# Patient Record
Sex: Male | Born: 1950
Health system: Southern US, Community
[De-identification: ages and names within clinical notes are randomized; demographics above are authoritative.]

## PROBLEM LIST (undated history)

## (undated) DIAGNOSIS — R748 Abnormal levels of other serum enzymes: Secondary | ICD-10-CM

## (undated) DIAGNOSIS — M1710 Unilateral primary osteoarthritis, unspecified knee: Secondary | ICD-10-CM

## (undated) DIAGNOSIS — E785 Hyperlipidemia, unspecified: Secondary | ICD-10-CM

## (undated) DIAGNOSIS — G250 Essential tremor: Secondary | ICD-10-CM

## (undated) DIAGNOSIS — K5140 Inflammatory polyps of colon without complications: Secondary | ICD-10-CM

## (undated) DIAGNOSIS — K5080 Crohn's disease of both small and large intestine without complications: Secondary | ICD-10-CM

## (undated) DIAGNOSIS — H9191 Unspecified hearing loss, right ear: Secondary | ICD-10-CM

## (undated) DIAGNOSIS — Z974 Presence of external hearing-aid: Secondary | ICD-10-CM

## (undated) DIAGNOSIS — H53459 Other localized visual field defect, unspecified eye: Secondary | ICD-10-CM

## (undated) DIAGNOSIS — F1090 Alcohol use, unspecified, uncomplicated: Secondary | ICD-10-CM

## (undated) DIAGNOSIS — H3320 Serous retinal detachment, unspecified eye: Secondary | ICD-10-CM

## (undated) DIAGNOSIS — K5090 Crohn's disease, unspecified, without complications: Secondary | ICD-10-CM

## (undated) DIAGNOSIS — K4090 Unilateral inguinal hernia, without obstruction or gangrene, not specified as recurrent: Secondary | ICD-10-CM

## (undated) HISTORY — PX: HERNIA REPAIR: SHX51

## (undated) HISTORY — DX: Other localized visual field defect, unspecified eye: H53.459

## (undated) HISTORY — DX: Abnormal levels of other serum enzymes: R74.8

## (undated) HISTORY — DX: Alcohol use, unspecified, uncomplicated: F10.90

## (undated) HISTORY — DX: Inflammatory polyps of colon without complications: K51.40

## (undated) HISTORY — DX: Crohn's disease of both small and large intestine without complications: K50.80

## (undated) HISTORY — DX: Serous retinal detachment, unspecified eye: H33.20

## (undated) HISTORY — DX: Hyperlipidemia, unspecified: E78.5

## (undated) HISTORY — PX: ACOUSTIC NEUROMA RESECTION: SHX5713

## (undated) HISTORY — PX: TONSILLECTOMY: SUR1361

## (undated) HISTORY — PX: CATARACT EXTRACTION W/ INTRAOCULAR LENS  IMPLANT, BILATERAL: SHX1307

## (undated) HISTORY — DX: Unilateral primary osteoarthritis, unspecified knee: M17.10

## (undated) HISTORY — PX: RETINAL DETACHMENT SURGERY: SHX105

---

## 1998-06-29 ENCOUNTER — Inpatient Hospital Stay (HOSPITAL_COMMUNITY): Admission: EM | Admit: 1998-06-29 | Discharge: 1998-07-02 | Payer: Self-pay | Admitting: Emergency Medicine

## 2000-08-17 ENCOUNTER — Ambulatory Visit (HOSPITAL_COMMUNITY): Admission: RE | Admit: 2000-08-17 | Discharge: 2000-08-17 | Payer: Self-pay | Admitting: Gastroenterology

## 2003-01-25 ENCOUNTER — Emergency Department (HOSPITAL_COMMUNITY): Admission: EM | Admit: 2003-01-25 | Discharge: 2003-01-25 | Payer: Self-pay | Admitting: Emergency Medicine

## 2003-11-14 ENCOUNTER — Ambulatory Visit (HOSPITAL_COMMUNITY): Admission: RE | Admit: 2003-11-14 | Discharge: 2003-11-15 | Payer: Self-pay | Admitting: Ophthalmology

## 2004-10-07 ENCOUNTER — Ambulatory Visit (HOSPITAL_COMMUNITY): Admission: RE | Admit: 2004-10-07 | Discharge: 2004-10-07 | Payer: Self-pay | Admitting: Gastroenterology

## 2011-08-06 ENCOUNTER — Other Ambulatory Visit: Payer: Self-pay | Admitting: Gastroenterology

## 2011-11-04 DIAGNOSIS — H53459 Other localized visual field defect, unspecified eye: Secondary | ICD-10-CM

## 2011-11-04 HISTORY — DX: Other localized visual field defect, unspecified eye: H53.459

## 2012-09-06 ENCOUNTER — Other Ambulatory Visit: Payer: Self-pay | Admitting: Gastroenterology

## 2012-10-05 DIAGNOSIS — E785 Hyperlipidemia, unspecified: Secondary | ICD-10-CM | POA: Insufficient documentation

## 2012-10-05 DIAGNOSIS — K5010 Crohn's disease of large intestine without complications: Secondary | ICD-10-CM | POA: Insufficient documentation

## 2012-10-05 HISTORY — DX: Hyperlipidemia, unspecified: E78.5

## 2012-10-11 HISTORY — PX: BROW LIFT AND BLEPHAROPLASTY: SHX1271

## 2013-11-21 ENCOUNTER — Other Ambulatory Visit: Payer: Self-pay | Admitting: Gastroenterology

## 2013-11-21 ENCOUNTER — Other Ambulatory Visit (HOSPITAL_COMMUNITY)
Admission: RE | Admit: 2013-11-21 | Discharge: 2013-11-21 | Disposition: A | Discharge status: Home / Self Care | Payer: Self-pay | Source: Ambulatory Visit | Attending: Gastroenterology | Admitting: Gastroenterology

## 2013-11-21 DIAGNOSIS — K5190 Ulcerative colitis, unspecified, without complications: Secondary | ICD-10-CM | POA: Insufficient documentation

## 2014-02-28 DIAGNOSIS — K5080 Crohn's disease of both small and large intestine without complications: Secondary | ICD-10-CM

## 2014-02-28 HISTORY — DX: Crohn's disease of both small and large intestine without complications: K50.80

## 2015-01-23 ENCOUNTER — Other Ambulatory Visit: Payer: Self-pay | Admitting: Gastroenterology

## 2015-12-10 MED FILL — ATORVASTATIN 20 MG TABLET: 20 | 31 days supply | Qty: 31 | Fill #5

## 2015-12-10 MED FILL — LIALDA 1.2 GM TABLET SA: 1.2 | 30 days supply | Qty: 60 | Fill #4

## 2016-01-14 MED FILL — LIALDA 1.2 GM TABLET SA: 1.2 | 30 days supply | Qty: 60 | Fill #5

## 2016-01-14 MED FILL — ATORVASTATIN 20 MG TABLET: 20 | 31 days supply | Qty: 31 | Fill #6

## 2016-02-06 MED FILL — LIALDA 1.2 GM TABLET SA: 1.2 | 30 days supply | Qty: 60 | Fill #6

## 2016-02-07 MED FILL — ATORVASTATIN 20 MG TABLET: 20 | 31 days supply | Qty: 31 | Fill #7

## 2016-03-19 MED FILL — LIALDA 1.2 GM TABLET SA: 1.2 | 30 days supply | Qty: 60 | Fill #7

## 2016-04-07 MED FILL — GAVILYTE-N SOLUTION: 420 | 1 days supply | Qty: 4000 | Fill #0

## 2016-04-20 DIAGNOSIS — M1710 Unilateral primary osteoarthritis, unspecified knee: Secondary | ICD-10-CM | POA: Insufficient documentation

## 2016-04-20 DIAGNOSIS — L570 Actinic keratosis: Secondary | ICD-10-CM | POA: Insufficient documentation

## 2016-04-20 HISTORY — DX: Unilateral primary osteoarthritis, unspecified knee: M17.10

## 2016-04-22 ENCOUNTER — Other Ambulatory Visit: Payer: Self-pay | Admitting: Gastroenterology

## 2016-04-22 LAB — HM COLONOSCOPY

## 2016-05-09 MED FILL — PROPRANOLOL 20 MG TABLET: 20 | 30 days supply | Qty: 60 | Fill #0

## 2016-05-26 MED FILL — LIALDA 1.2 GM TABLET SA: 1.2 | 30 days supply | Qty: 60 | Fill #0

## 2016-06-05 DIAGNOSIS — G250 Essential tremor: Secondary | ICD-10-CM | POA: Insufficient documentation

## 2016-06-05 DIAGNOSIS — R1909 Other intra-abdominal and pelvic swelling, mass and lump: Secondary | ICD-10-CM | POA: Insufficient documentation

## 2016-06-23 ENCOUNTER — Other Ambulatory Visit: Payer: Self-pay | Admitting: Surgery

## 2016-07-01 LAB — LIPID PANEL
HDL: 34 — AB (ref 35–70)
LDL Cholesterol: 110
Triglycerides: 88 (ref 40–160)

## 2016-07-01 LAB — BASIC METABOLIC PANEL
Potassium: 4.3 (ref 3.4–5.3)
Sodium: 143 (ref 137–147)

## 2016-07-01 LAB — TSH: TSH: 5.01 (ref 0.41–5.90)

## 2016-07-08 DIAGNOSIS — K4090 Unilateral inguinal hernia, without obstruction or gangrene, not specified as recurrent: Secondary | ICD-10-CM

## 2016-07-08 HISTORY — DX: Unilateral inguinal hernia, without obstruction or gangrene, not specified as recurrent: K40.90

## 2016-07-09 MED FILL — PROPRANOLOL 20 MG TABLET: 20 | 30 days supply | Qty: 60 | Fill #0

## 2016-07-09 MED FILL — LIALDA 1.2 GM TABLET SA: 1.2 | 30 days supply | Qty: 60 | Fill #1

## 2016-07-10 ENCOUNTER — Encounter (HOSPITAL_BASED_OUTPATIENT_CLINIC_OR_DEPARTMENT_OTHER): Payer: Self-pay | Admitting: *Deleted

## 2016-07-15 NOTE — H&P (Signed)
Shaun Armstrong 06/23/2016 3:41 PM Location: [facility] Patient #: [id number] DOB: [birth year] Married / Language: English / Race: White Male   History of Present Illness (Elenie Coven A. Ninfa Linden MD; 06/23/2016 4:09 PM) Patient words: [place].  The patient is a 65 year old male who presents with an inguinal hernia. This is a pleasant gentleman referred by Dr. Mikki Santee Buccini for evaluation of a right inguinal hernia. The patient noticed a reducible bulge in his right groin since May. He has had minimal discomfort with this. He reports that it always easily reduces. He has had no objective symptoms and no difficulty voiding. He is otherwise without complaints.   Other Problems Malachi Bonds, CMA; 06/23/2016 3:41 PM) Crohn's Disease Hypercholesterolemia Other disease, cancer, significant illness  Past Surgical History Malachi Bonds, CMA; 06/23/2016 3:41 PM) Cataract Surgery Bilateral. Colon Polyp Removal - Colonoscopy Oral Surgery Tonsillectomy  Diagnostic Studies History Malachi Bonds, CMA; 06/23/2016 3:41 PM) Colonoscopy within last year  Allergies Malachi Bonds, CMA; 06/23/2016 3:44 PM) No Known Drug Allergies07/17/2017  Medication History Malachi Bonds, CMA; 06/23/2016 3:47 PM) Atorvastatin Calcium (20MG Tablet, Oral) Active. Propranolol HCl (20MG Tablet, Oral) Active. Lialda (1.2GM Tablet DR, Oral) Active. Medications Reconciled  Social History Malachi Bonds, CMA; 06/23/2016 3:41 PM) Alcohol use Occasional alcohol use. Caffeine use Coffee. Illicit drug use Remotely quit drug use. Tobacco use Former smoker.  Family History Malachi Bonds, CMA; 06/23/2016 3:41 PM) Hypertension Mother.    Review of Systems Malachi Bonds CMA; 06/23/2016 3:41 PM) General Not Present- Appetite Loss, Chills, Fatigue, Fever, Night Sweats, Weight Gain and Weight Loss. Skin Not Present- Change in Wart/Mole, Dryness, Hives, Jaundice, New Lesions, Non-Healing Wounds,  Rash and Ulcer. HEENT Present- Hearing Loss and Ringing in the Ears. Not Present- Earache, Hoarseness, Nose Bleed, Oral Ulcers, Seasonal Allergies, Sinus Pain, Sore Throat, Visual Disturbances, Wears glasses/contact lenses and Yellow Eyes. Respiratory Present- Snoring. Not Present- Bloody sputum, Chronic Cough, Difficulty Breathing and Wheezing. Breast Not Present- Breast Mass, Breast Pain, Nipple Discharge and Skin Changes. Cardiovascular Not Present- Chest Pain, Difficulty Breathing Lying Down, Leg Cramps, Palpitations, Rapid Heart Rate, Shortness of Breath and Swelling of Extremities. Gastrointestinal Present- Abdominal Pain. Not Present- Bloating, Bloody Stool, Change in Bowel Habits, Chronic diarrhea, Constipation, Difficulty Swallowing, Excessive gas, Gets full quickly at meals, Hemorrhoids, Indigestion, Nausea, Rectal Pain and Vomiting. Male Genitourinary Not Present- Blood in Urine, Change in Urinary Stream, Frequency, Impotence, Nocturia, Painful Urination, Urgency and Urine Leakage. Musculoskeletal Not Present- Back Pain, Joint Pain, Joint Stiffness, Muscle Pain, Muscle Weakness and Swelling of Extremities. Neurological Present- Tremor. Not Present- Decreased Memory, Fainting, Headaches, Numbness, Seizures, Tingling, Trouble walking and Weakness. Psychiatric Not Present- Anxiety, Bipolar, Change in Sleep Pattern, Depression, Fearful and Frequent crying. Endocrine Not Present- Cold Intolerance, Excessive Hunger, Hair Changes, Heat Intolerance, Hot flashes and New Diabetes. Hematology Not Present- Blood Thinners, Easy Bruising, Excessive bleeding, Gland problems, HIV and Persistent Infections.  Vitals (Chemira Jones CMA; 06/23/2016 3:43 PM) 06/23/2016 3:42 PM Weight: 195.6 lb Height: 69in Body Surface Area: 2.05 m Body Mass Index: 28.88 kg/m  Temp.: 98.71F  Pulse: 55 (Regular)  P.OX: 94% (Room air) BP: 122/66 (Sitting, Left Arm, Standard)       Physical Exam (Breelle Hollywood A.  Ninfa Linden MD; 06/23/2016 4:09 PM) General Mental Status-Alert. General Appearance-Consistent with stated age. Hydration-Well hydrated. Voice-Normal.  Head and Neck Head-normocephalic, atraumatic with no lesions or palpable masses. Trachea-midline.  Eye Eyeball - Bilateral-Extraocular movements intact. Sclera/Conjunctiva - Bilateral-No scleral icterus.  Chest and Lung Exam  Chest and lung exam reveals -quiet, even and easy respiratory effort with no use of accessory muscles and on auscultation, normal breath sounds, no adventitious sounds and normal vocal resonance. Inspection Chest Wall - Normal. Back - normal.  Cardiovascular Cardiovascular examination reveals -normal heart sounds, regular rate and rhythm with no murmurs and normal pedal pulses bilaterally.  Abdomen Inspection Skin - Scar - no surgical scars. Hernias - Inguinal hernia - Right - Reducible. Palpation/Percussion Palpation and Percussion of the abdomen reveal - Soft, Non Tender, No Rebound tenderness, No Rigidity (guarding) and No hepatosplenomegaly. Auscultation Auscultation of the abdomen reveals - Bowel sounds normal.  Neurologic Neurologic evaluation reveals -alert and oriented x 3 with no impairment of recent or remote memory. Mental Status-Normal.  Musculoskeletal Normal Exam - Left-Upper Extremity Strength Normal and Lower Extremity Strength Normal. Normal Exam - Right-Upper Extremity Strength Normal, Lower Extremity Weakness.    Assessment & Plan (Samson Ralph A. Ninfa Linden MD; 06/23/2016 4:12 PM) RIGHT INGUINAL HERNIA (K40.90) Impression: I discussed the diagnosis with him in detail. I discussed surgical repair of a right inguinal hernia with mesh. I discussed both the laparoscopic and open techniques. He wished to proceed with a right laparoscopic inguinal hernia repair with mesh. I discussed the risk of surgery which includes but is not limited to bleeding, infection, injury to  surrounding structures, nerve entrapment, chronic pain, recurrent hernia, etc. I also discussed postoperative recovery. He understands and wishes to proceed with surgery which will be scheduled

## 2016-07-16 ENCOUNTER — Encounter (HOSPITAL_BASED_OUTPATIENT_CLINIC_OR_DEPARTMENT_OTHER)
Admission: RE | Disposition: A | Discharge status: Home / Self Care | Payer: Self-pay | Source: Ambulatory Visit | Attending: Surgery

## 2016-07-16 ENCOUNTER — Ambulatory Visit (HOSPITAL_BASED_OUTPATIENT_CLINIC_OR_DEPARTMENT_OTHER)
Admission: RE | Admit: 2016-07-16 | Discharge: 2016-07-16 | Disposition: A | Discharge status: Home / Self Care | Payer: 59 | Source: Ambulatory Visit | Attending: Surgery | Admitting: Surgery

## 2016-07-16 ENCOUNTER — Ambulatory Visit (HOSPITAL_BASED_OUTPATIENT_CLINIC_OR_DEPARTMENT_OTHER): Payer: 59 | Admitting: Anesthesiology

## 2016-07-16 ENCOUNTER — Encounter (HOSPITAL_BASED_OUTPATIENT_CLINIC_OR_DEPARTMENT_OTHER): Payer: Self-pay | Admitting: Anesthesiology

## 2016-07-16 DIAGNOSIS — Z87891 Personal history of nicotine dependence: Secondary | ICD-10-CM | POA: Diagnosis not present

## 2016-07-16 DIAGNOSIS — K4090 Unilateral inguinal hernia, without obstruction or gangrene, not specified as recurrent: Secondary | ICD-10-CM | POA: Insufficient documentation

## 2016-07-16 HISTORY — DX: Crohn's disease, unspecified, without complications: K50.90

## 2016-07-16 HISTORY — PX: INSERTION OF MESH: SHX5868

## 2016-07-16 HISTORY — PX: INGUINAL HERNIA REPAIR: SHX194

## 2016-07-16 HISTORY — DX: Unilateral inguinal hernia, without obstruction or gangrene, not specified as recurrent: K40.90

## 2016-07-16 HISTORY — DX: Essential tremor: G25.0

## 2016-07-16 HISTORY — DX: Presence of external hearing-aid: Z97.4

## 2016-07-16 HISTORY — DX: Unspecified hearing loss, right ear: H91.91

## 2016-07-16 SURGERY — REPAIR, HERNIA, INGUINAL, LAPAROSCOPIC
Anesthesia: General | Site: Groin | Laterality: Right

## 2016-07-16 MED ORDER — KETOROLAC TROMETHAMINE 30 MG/ML IJ SOLN
INTRAMUSCULAR | Status: AC
  Filled 2016-07-16: qty 1

## 2016-07-16 MED ORDER — FENTANYL CITRATE (PF) 100 MCG/2ML IJ SOLN: 50.0000 ug | INTRAMUSCULAR | Status: DC | PRN

## 2016-07-16 MED ORDER — ONDANSETRON HCL 4 MG/2ML IJ SOLN
INTRAMUSCULAR | Status: DC | PRN
  Administered 2016-07-16: 4 mg via INTRAVENOUS

## 2016-07-16 MED ORDER — SUCCINYLCHOLINE CHLORIDE 200 MG/10ML IV SOSY
PREFILLED_SYRINGE | INTRAVENOUS | Status: AC
  Filled 2016-07-16: qty 10

## 2016-07-16 MED ORDER — DEXAMETHASONE SODIUM PHOSPHATE 10 MG/ML IJ SOLN
INTRAMUSCULAR | Status: AC
  Filled 2016-07-16: qty 1

## 2016-07-16 MED ORDER — PROPOFOL 500 MG/50ML IV EMUL
INTRAVENOUS | Status: AC
  Filled 2016-07-16: qty 50

## 2016-07-16 MED ORDER — CIPROFLOXACIN IN D5W 400 MG/200ML IV SOLN
INTRAVENOUS | Status: DC | PRN
  Administered 2016-07-16: 400 mg via INTRAVENOUS

## 2016-07-16 MED ORDER — OXYCODONE HCL 5 MG PO TABS
5.0000 mg | ORAL_TABLET | Freq: Once | ORAL | Status: AC | PRN
  Administered 2016-07-16: 5 mg via ORAL

## 2016-07-16 MED ORDER — SUCCINYLCHOLINE CHLORIDE 20 MG/ML IJ SOLN
INTRAMUSCULAR | Status: DC | PRN
  Administered 2016-07-16: 120 mg via INTRAVENOUS

## 2016-07-16 MED ORDER — OXYCODONE HCL 5 MG PO TABS: 5.0000 mg | ORAL_TABLET | ORAL | Status: DC | PRN

## 2016-07-16 MED ORDER — HYDROMORPHONE HCL 1 MG/ML IJ SOLN
0.2500 mg | INTRAMUSCULAR | Status: DC | PRN
Start: 2016-07-16 — End: 2016-07-16

## 2016-07-16 MED ORDER — KETOROLAC TROMETHAMINE 30 MG/ML IJ SOLN
INTRAMUSCULAR | Status: DC | PRN
Start: 2016-07-16 — End: 2016-07-16
  Administered 2016-07-16: 30 mg via INTRAVENOUS

## 2016-07-16 MED ORDER — MIDAZOLAM HCL 2 MG/2ML IJ SOLN
INTRAMUSCULAR | Status: AC
  Filled 2016-07-16: qty 2

## 2016-07-16 MED ORDER — LIDOCAINE HCL (CARDIAC) 20 MG/ML IV SOLN
INTRAVENOUS | Status: DC | PRN
  Administered 2016-07-16: 100 mg via INTRAVENOUS

## 2016-07-16 MED ORDER — OXYCODONE HCL 5 MG PO TABS
ORAL_TABLET | ORAL | Status: AC
  Filled 2016-07-16: qty 1

## 2016-07-16 MED ORDER — MORPHINE SULFATE (PF) 2 MG/ML IV SOLN: 1.0000 mg | INTRAVENOUS | Status: DC | PRN

## 2016-07-16 MED ORDER — OXYCODONE HCL 5 MG/5ML PO SOLN: 5.0000 mg | Freq: Once | ORAL | Status: AC | PRN

## 2016-07-16 MED ORDER — SODIUM CHLORIDE 0.9% FLUSH: 3.0000 mL | INTRAVENOUS | Status: DC | PRN

## 2016-07-16 MED ORDER — ONDANSETRON HCL 4 MG/2ML IJ SOLN: 4.0000 mg | Freq: Once | INTRAMUSCULAR | Status: DC | PRN

## 2016-07-16 MED ORDER — SCOPOLAMINE 1 MG/3DAYS TD PT72: 1.0000 | MEDICATED_PATCH | Freq: Once | TRANSDERMAL | Status: DC | PRN

## 2016-07-16 MED ORDER — HYDROCODONE-ACETAMINOPHEN 5-325 MG PO TABS: 1.0000 | ORAL_TABLET | ORAL | 0 refills | Status: DC | PRN

## 2016-07-16 MED ORDER — CIPROFLOXACIN IN D5W 400 MG/200ML IV SOLN
INTRAVENOUS | Status: AC
  Filled 2016-07-16: qty 200

## 2016-07-16 MED ORDER — LACTATED RINGERS IV SOLN
INTRAVENOUS | Status: DC | PRN
  Administered 2016-07-16: 10:00:00 via INTRAVENOUS

## 2016-07-16 MED ORDER — ONDANSETRON HCL 4 MG/2ML IJ SOLN
INTRAMUSCULAR | Status: AC
  Filled 2016-07-16: qty 2

## 2016-07-16 MED ORDER — MEPERIDINE HCL 25 MG/ML IJ SOLN: 6.2500 mg | INTRAMUSCULAR | Status: DC | PRN

## 2016-07-16 MED ORDER — GLYCOPYRROLATE 0.2 MG/ML IJ SOLN: 0.2000 mg | Freq: Once | INTRAMUSCULAR | Status: DC | PRN

## 2016-07-16 MED ORDER — FENTANYL CITRATE (PF) 100 MCG/2ML IJ SOLN
INTRAMUSCULAR | Status: DC | PRN
  Administered 2016-07-16: 100 ug via INTRAVENOUS

## 2016-07-16 MED ORDER — BUPIVACAINE-EPINEPHRINE (PF) 0.5% -1:200000 IJ SOLN
INTRAMUSCULAR | Status: DC | PRN
  Administered 2016-07-16: 20 mL

## 2016-07-16 MED ORDER — PROPOFOL 10 MG/ML IV BOLUS
INTRAVENOUS | Status: DC | PRN
Start: 2016-07-16 — End: 2016-07-16
  Administered 2016-07-16: 200 mg via INTRAVENOUS

## 2016-07-16 MED ORDER — FENTANYL CITRATE (PF) 100 MCG/2ML IJ SOLN
INTRAMUSCULAR | Status: AC
Start: 2016-07-16 — End: 2016-07-16
  Filled 2016-07-16: qty 2

## 2016-07-16 MED ORDER — MIDAZOLAM HCL 5 MG/5ML IJ SOLN
INTRAMUSCULAR | Status: DC | PRN
  Administered 2016-07-16: 2 mg via INTRAVENOUS

## 2016-07-16 MED ORDER — MIDAZOLAM HCL 2 MG/2ML IJ SOLN: 1.0000 mg | INTRAMUSCULAR | Status: DC | PRN

## 2016-07-16 MED ORDER — DEXAMETHASONE SODIUM PHOSPHATE 4 MG/ML IJ SOLN
INTRAMUSCULAR | Status: DC | PRN
  Administered 2016-07-16: 10 mg via INTRAVENOUS

## 2016-07-16 MED ORDER — SODIUM CHLORIDE 0.9% FLUSH: 3.0000 mL | Freq: Two times a day (BID) | INTRAVENOUS | Status: DC

## 2016-07-16 MED ORDER — SODIUM CHLORIDE 0.9 % IV SOLN: 250.0000 mL | INTRAVENOUS | Status: DC | PRN

## 2016-07-16 MED ORDER — ACETAMINOPHEN 650 MG RE SUPP: 650.0000 mg | RECTAL | Status: DC | PRN

## 2016-07-16 MED ORDER — ACETAMINOPHEN 325 MG PO TABS: 650.0000 mg | ORAL_TABLET | ORAL | Status: DC | PRN

## 2016-07-16 MED ORDER — LIDOCAINE 2% (20 MG/ML) 5 ML SYRINGE
INTRAMUSCULAR | Status: AC
  Filled 2016-07-16: qty 5

## 2016-07-16 MED ORDER — LACTATED RINGERS IV SOLN: INTRAVENOUS | Status: DC

## 2016-07-16 MED FILL — ATORVASTATIN 20 MG TABLET: 20 | 30 days supply | Qty: 30 | Fill #0

## 2016-07-16 MED FILL — HYDROCODON-APAP 5-325: 5-325 | 5 days supply | Qty: 40 | Fill #0

## 2016-07-16 SURGICAL SUPPLY — 33 items
APPLIER CLIP LOGIC TI 5 (MISCELLANEOUS) IMPLANT
APR CLP MED LRG 33X5 (MISCELLANEOUS)
BLADE CLIPPER SURG (BLADE) ×1 IMPLANT
CHLORAPREP W/TINT 26ML (MISCELLANEOUS) ×2 IMPLANT
DEVICE SECURE STRAP 25 ABSORB (INSTRUMENTS) ×2 IMPLANT
DISSECT BALLN SPACEMKR + OVL (BALLOONS) ×2
DISSECTOR BALLN SPACEMKR + OVL (BALLOONS) ×1 IMPLANT
DISSECTOR BLUNT TIP ENDO 5MM (MISCELLANEOUS) IMPLANT
ELECT REM PT RETURN 9FT ADLT (ELECTROSURGICAL) ×2
ELECTRODE REM PT RTRN 9FT ADLT (ELECTROSURGICAL) ×1 IMPLANT
GLOVE BIO SURGEON STRL SZ8 (GLOVE) ×1 IMPLANT
GLOVE BIOGEL PI IND STRL 8 (GLOVE) IMPLANT
GLOVE BIOGEL PI INDICATOR 8 (GLOVE) ×1
GLOVE EXAM NITRILE MD LF STRL (GLOVE) ×1 IMPLANT
GLOVE SURG SIGNA 7.5 PF LTX (GLOVE) ×2 IMPLANT
GOWN STRL REUS W/ TWL LRG LVL3 (GOWN DISPOSABLE) ×1 IMPLANT
GOWN STRL REUS W/ TWL XL LVL3 (GOWN DISPOSABLE) ×1 IMPLANT
GOWN STRL REUS W/TWL LRG LVL3 (GOWN DISPOSABLE) ×2
GOWN STRL REUS W/TWL XL LVL3 (GOWN DISPOSABLE) ×2
LIQUID BAND (GAUZE/BANDAGES/DRESSINGS) ×3 IMPLANT
MESH 3DMAX 4X6 RT LRG (Mesh General) ×1 IMPLANT
NDL INSUFFLATION 14GA 120MM (NEEDLE) IMPLANT
NEEDLE INSUFFLATION 14GA 120MM (NEEDLE) IMPLANT
PACK BASIN DAY SURGERY FS (CUSTOM PROCEDURE TRAY) ×2 IMPLANT
SCISSORS LAP 5X35 DISP (ENDOMECHANICALS) IMPLANT
SET IRRIG TUBING LAPAROSCOPIC (IRRIGATION / IRRIGATOR) IMPLANT
SET TROCAR LAP APPLE-HUNT 5MM (ENDOMECHANICALS) ×2 IMPLANT
SLEEVE SCD COMPRESS KNEE MED (MISCELLANEOUS) ×2 IMPLANT
SUT MNCRL AB 4-0 PS2 18 (SUTURE) ×2 IMPLANT
TOWEL OR 17X24 6PK STRL BLUE (TOWEL DISPOSABLE) ×2 IMPLANT
TRAY FOLEY CATH SILVER 16FR (SET/KITS/TRAYS/PACK) ×1 IMPLANT
TRAY LAPAROSCOPIC (CUSTOM PROCEDURE TRAY) ×2 IMPLANT
TUBING INSUFFLATION (TUBING) ×2 IMPLANT

## 2016-07-16 NOTE — Interval H&P Note (Signed)
History and Physical Interval Note: no change in H and P  07/16/2016 9:46 AM  Shaun Armstrong  has presented today for surgery, with the diagnosis of Right inguinal hernia   The various methods of treatment have been discussed with the patient and family. After consideration of risks, benefits and other options for treatment, the patient has consented to  Procedure(s): LAPAROSCOPIC RIGHT INGUINAL HERNIA (Right) INSERTION OF MESH (Right) as a surgical intervention .  The patient's history has been reviewed, patient examined, no change in status, stable for surgery.  I have reviewed the patient's chart and labs.  Questions were answered to the patient's satisfaction.     [name] A

## 2016-07-16 NOTE — Transfer of Care (Signed)
Immediate Anesthesia Transfer of Care Note  Patient: Shaun Armstrong  Procedure(s) Performed: Procedure(s): LAPAROSCOPIC RIGHT INGUINAL HERNIA (Right) INSERTION OF MESH (Right)  Patient Location: PACU  Anesthesia Type:General  Level of Consciousness: sedated  Airway & Oxygen Therapy: Patient Spontanous Breathing and Patient connected to face mask oxygen  Post-op Assessment: Report given to RN and Post -op Vital signs reviewed and stable  Post vital signs: Reviewed and stable  Last Vitals:  Vitals:   07/16/16 0943  BP: (!) 148/79  Pulse: (!) 51  Resp: 20  Temp: 36.6 C    Last Pain:  Vitals:   07/16/16 0943  TempSrc: Oral         Complications: No apparent anesthesia complications

## 2016-07-16 NOTE — Op Note (Signed)
LAPAROSCOPIC RIGHT INGUINAL HERNIA, INSERTION OF MESH  Procedure Note  [name] 07/16/2016   Pre-op Diagnosis: Right inguinal hernia      Post-op Diagnosis: same  Procedure(s): LAPAROSCOPIC RIGHT INGUINAL HERNIA INSERTION OF MESH  Surgeon(s): Coralie Keens, MD  Anesthesia: General  Staff:  Circulator: Maurene Capes, RN Relief Circulator: Eston Esters, RN Scrub Person: Vickey Huger, RN  Estimated Blood Loss: Minimal                         Shaun Armstrong   Date: 07/16/2016  Time: 11:00 AM

## 2016-07-16 NOTE — Discharge Instructions (Signed)
[facility] _______[facility]  UMBILICAL OR INGUINAL HERNIA REPAIR: POST OP INSTRUCTIONS  Always review your discharge instruction sheet given to you by the facility where your surgery was performed. IF YOU HAVE DISABILITY OR FAMILY LEAVE FORMS, YOU MUST BRING THEM TO THE OFFICE FOR PROCESSING.   DO NOT GIVE THEM TO YOUR DOCTOR.  1. A  prescription for pain medication may be given to you upon discharge.  Take your pain medication as prescribed, if needed.  If narcotic pain medicine is not needed, then you may take acetaminophen (Tylenol) or ibuprofen (Advil) as needed. 2. Take your usually prescribed medications unless otherwise directed. 3. If you need a refill on your pain medication, please contact your pharmacy.  They will contact our office to request authorization. Prescriptions will not be filled after 5 pm or on week-ends. 4. You should follow a light diet the first 24 hours after arrival home, such as soup and crackers, etc.  Be sure to include lots of fluids daily.  Resume your normal diet the day after surgery. 5. Most patients will experience some swelling and bruising around the umbilicus or in the groin and scrotum.  Ice packs and reclining will help.  Swelling and bruising can take several days to resolve.  6. It is common to experience some constipation if taking pain medication after surgery.  Increasing fluid intake and taking a stool softener (such as Colace) will usually help or prevent this problem from occurring.  A mild laxative (Milk of Magnesia or Miralax) should be taken according to package directions if there are no bowel movements after 48 hours. 7. Unless discharge instructions indicate otherwise, you may remove your bandages 24-48 hours after surgery, and you may shower at that time.  You may have steri-strips (small skin tapes) in place directly over the incision.  These strips should be left on the skin for 7-10 days.  If your surgeon used skin glue on the  incision, you may shower in 24 hours.  The glue will flake off over the next 2-3 weeks.  Any sutures or staples will be removed at the office during your follow-up visit. 8. ACTIVITIES:  You may resume regular (light) daily activities beginning the next day--such as daily self-care, walking, climbing stairs--gradually increasing activities as tolerated.  You may have sexual intercourse when it is comfortable.  Refrain from any heavy lifting or straining until approved by your doctor. a. You may drive when you are no longer taking prescription pain medication, you can comfortably wear a seatbelt, and you can safely maneuver your car and apply brakes. b. RETURN TO WORK:  __________________________________________________________ 9. You should see your doctor in the office for a follow-up appointment approximately 2-3 weeks after your surgery.  Make sure that you call for this appointment within a day or two after you arrive home to insure a convenient appointment time. 10. OTHER INSTRUCTIONS: NO LIFTING MORE THAN 15 POUNDS FOR 3 WEEKS 11. ICE PACK AND IBUPROFEN ALSO FOR PAIN __________________________________________________________________________________________________________________________________________________________________________________________  WHEN TO CALL YOUR DOCTOR: 1. Fever over 101.0 2. Inability to urinate 3. Nausea and/or vomiting 4. Extreme swelling or bruising 5. Continued bleeding from incision. 6. Increased pain, redness, or drainage from the incision  The clinic staff is available to answer your questions during regular business hours.  Please dont hesitate to call and ask to speak to one of the nurses for clinical concerns.  If you have a medical emergency, go to the nearest emergency room or call 911.  A surgeon from [facility] is always on call at the hospital   [address] [place], [place], [place]  [address] ?  [address], [place], [place] [phone] ? [phone] ? FAX [phone] Web site: [email or web address]     Post Anesthesia Home Care Instructions  Activity: Get plenty of rest for the remainder of the day. A responsible adult should stay with you for 24 hours following the procedure.  For the next 24 hours, DO NOT: -Drive a car -Paediatric nurse -Drink alcoholic beverages -Take any medication unless instructed by your physician -Make any legal decisions or sign important papers.  Meals: Start with liquid foods such as gelatin or soup. Progress to regular foods as tolerated. Avoid greasy, spicy, heavy foods. If nausea and/or vomiting occur, drink only clear liquids until the nausea and/or vomiting subsides. Call your physician if vomiting continues.  Special Instructions/Symptoms: Your throat may feel dry or sore from the anesthesia or the breathing tube placed in your throat during surgery. If this causes discomfort, gargle with warm salt water. The discomfort should disappear within 24 hours.  If you had a scopolamine patch placed behind your ear for the management of post- operative nausea and/or vomiting:  1. The medication in the patch is effective for 72 hours, after which it should be removed.  Wrap patch in a tissue and discard in the trash. Wash hands thoroughly with soap and water. 2. You may remove the patch earlier than 72 hours if you experience unpleasant side effects which may include dry mouth, dizziness or visual disturbances. 3. Avoid touching the patch. Wash your hands with soap and water after contact with the patch.

## 2016-07-16 NOTE — Anesthesia Postprocedure Evaluation (Signed)
Anesthesia Post Note  Patient: Shaun Armstrong  Procedure(s) Performed: Procedure(s) (LRB): LAPAROSCOPIC RIGHT INGUINAL HERNIA (Right) INSERTION OF MESH (Right)  Patient location during evaluation: PACU Anesthesia Type: General Level of consciousness: awake and alert Pain management: pain level controlled Vital Signs Assessment: post-procedure vital signs reviewed and stable Respiratory status: spontaneous breathing, nonlabored ventilation and respiratory function stable Cardiovascular status: blood pressure returned to baseline and stable Postop Assessment: no signs of nausea or vomiting Anesthetic complications: no    Last Vitals:  Vitals:   07/16/16 1130 07/16/16 1150  BP: (!) 156/81 (!) 145/82  Pulse: (!) 56 77  Resp: 16 16  Temp:  36.8 C    Last Pain:  Vitals:   07/16/16 1150  TempSrc:   PainSc: 3                  Ranae Casebier A

## 2016-07-16 NOTE — Anesthesia Preprocedure Evaluation (Signed)
Anesthesia Evaluation  Patient identified by MRN, date of birth, ID band Patient awake    Reviewed: Allergy & Precautions, NPO status , Patient's Chart, lab work & pertinent test results  Airway Mallampati: I  TM Distance: >3 FB Neck ROM: Full    Dental  (+) Teeth Intact, Dental Advisory Given   Pulmonary    breath sounds clear to auscultation       Cardiovascular  Rhythm:Regular Rate:Normal     Neuro/Psych    GI/Hepatic   Endo/Other    Renal/GU      Musculoskeletal   Abdominal   Peds  Hematology   Anesthesia Other Findings   Reproductive/Obstetrics                             Anesthesia Physical Anesthesia Plan  ASA: I  Anesthesia Plan: General   Post-op Pain Management:    Induction: Intravenous  Airway Management Planned:   Additional Equipment:   Intra-op Plan:   Post-operative Plan: Extubation in OR  Informed Consent: I have reviewed the patients History and Physical, chart, labs and discussed the procedure including the risks, benefits and alternatives for the proposed anesthesia with the patient or authorized representative who has indicated his/her understanding and acceptance.   Dental advisory given  Plan Discussed with: CRNA, Anesthesiologist and Surgeon  Anesthesia Plan Comments:         Anesthesia Quick Evaluation

## 2016-07-16 NOTE — Anesthesia Procedure Notes (Signed)
Procedure Name: Intubation Date/Time: 07/16/2016 10:20 AM Performed by: Lieutenant Diego Pre-anesthesia Checklist: Patient identified, Emergency Drugs available, Suction available and Patient being monitored Patient Re-evaluated:Patient Re-evaluated prior to inductionOxygen Delivery Method: Circle system utilized Preoxygenation: Pre-oxygenation with 100% oxygen Intubation Type: IV induction Ventilation: Mask ventilation without difficulty Laryngoscope Size: Miller and 2 Grade View: Grade I Tube type: Oral Tube size: 7.0 mm Number of attempts: 1 Airway Equipment and Method: Stylet Placement Confirmation: ETT inserted through vocal cords under direct vision,  positive ETCO2 and breath sounds checked- equal and bilateral Secured at: 24 cm Tube secured with: Tape Dental Injury: Teeth and Oropharynx as per pre-operative assessment

## 2016-07-16 NOTE — Op Note (Signed)
NAMEREIS, GOGA NO.:  [id number]  MEDICAL RECORD NO.:  [id number]  LOCATION:                                 FACILITY:  PHYSICIAN:  Coralie Keens, M.D. DATE OF BIRTH:  1951/06/01  DATE OF PROCEDURE:  07/16/2016 DATE OF DISCHARGE:                              OPERATIVE REPORT   PREOPERATIVE DIAGNOSIS:  Right inguinal hernia.  POSTOPERATIVE DIAGNOSIS:  Right inguinal hernia.  PROCEDURE:  Laparoscopic right inguinal hernia repair with mesh.  SURGEON:  Coralie Keens, M.D.  ANESTHESIA:  General with 0.5% Marcaine.  ESTIMATED BLOOD LOSS:  Minimal.  FINDINGS:  The patient was found to have an indirect right inguinal hernia which was repaired with a piece of large Prolene Bard 3DMax mesh. There was no evidence of left inguinal hernia.  PROCEDURE IN DETAIL:  The patient was brought to the operating room, identified as Shaun Armstrong.  He was placed supine on the operating table and general anesthesia was induced.  His abdomen was then prepped and draped in usual sterile fashion.  I made a small vertical incision just below the umbilicus.  I carried this down to the fascia, which was opened just to the right of the midline.  The rectus muscle was then identified and elevated.  The dissecting balloon was then passed underneath the rectus muscle and manipulated toward the pubis.  The dissecting balloon was then insufflated under direct vision dissecting out the preperitoneal space.  The dissecting balloon was then removed and insufflation was begun with carbon dioxide.  Two 5 mm ports were then placed in the patient's midline under direct vision.  I dissected out the inguinal areas.  I saw no evidence of left inguinal hernia.  The patient had a large indirect right inguinal hernia sac.  This was slightly difficult to reduce from the cord structures but I was able to finally do this completely reducing the sac.  There was no evidence of direct hernia.  At  this point, a piece of Bard 3DMax Prolene mesh was brought onto the field.  I placed it through the umbilical port and then manipulated the mesh toward the right inguinal area.  I then tacked the mesh to Cooper ligament up the medial abdominal wall and out laterally as an onlay.  Wide coverage of the cord structures and inguinal floor appeared to be achieved.  Hemostasis also appeared to be achieved.  At this point, the midline ports were removed under direct vision, and the area was seen to be collapsed appropriately.  At this point, I released the air from the abdominal cavity.  I then closed the patient's midline fascia with a figure-of-eight 0 Vicryl suture.  All incisions were anesthetized with Marcaine.  I performed an ilioinguinal nerve block on the right side with Marcaine as well.  I then closed all incisions with 4-0 Monocryl sutures.  Skin glue was then applied.  The patient tolerated the procedure well.  All the counts were correct at the end of the procedure.  The patient was then extubated in the operating room and taken in stable condition to the recovery room.     Coralie Keens, M.D.  ______________________________ Coralie Keens, M.D.    DB/MEDQ  D:  07/16/2016  T:  07/16/2016  Job:  [id number]

## 2016-07-17 ENCOUNTER — Encounter (HOSPITAL_BASED_OUTPATIENT_CLINIC_OR_DEPARTMENT_OTHER): Payer: Self-pay | Admitting: Surgery

## 2016-08-28 MED FILL — PROPRANOLOL 20 MG TABLET: 20 | 30 days supply | Qty: 60 | Fill #1

## 2016-08-28 MED FILL — LIALDA 1.2 GM TABLET SA: 1.2 | 30 days supply | Qty: 60 | Fill #2

## 2016-08-28 MED FILL — ATORVASTATIN 20 MG TABLET: 20 | 30 days supply | Qty: 30 | Fill #1

## 2016-09-23 MED FILL — ATORVASTATIN 20 MG TABLET: 20 | 30 days supply | Qty: 30 | Fill #2

## 2016-09-23 MED FILL — LIALDA 1.2 GM TABLET SA: 1.2 | 30 days supply | Qty: 60 | Fill #3

## 2016-09-23 MED FILL — PROPRANOLOL 20 MG TABLET: 20 | 30 days supply | Qty: 60 | Fill #2

## 2016-10-22 MED FILL — LIALDA 1.2 GM TABLET SA: 1.2 | 30 days supply | Qty: 60 | Fill #4

## 2016-10-22 MED FILL — PROPRANOLOL 20 MG TABLET: 20 | 30 days supply | Qty: 60 | Fill #3

## 2016-10-22 MED FILL — ATORVASTATIN 20 MG TABLET: 20 | 30 days supply | Qty: 30 | Fill #3

## 2016-11-19 MED FILL — LIALDA 1.2 GM TABLET SA: 1.2 | 30 days supply | Qty: 60 | Fill #5

## 2016-11-19 MED FILL — PROPRANOLOL 20 MG TABLET: 20 | 30 days supply | Qty: 60 | Fill #4

## 2016-11-19 MED FILL — ATORVASTATIN 20 MG TABLET: 20 | 30 days supply | Qty: 30 | Fill #4

## 2016-12-23 MED FILL — PROPRANOLOL 20 MG TABLET: 20 | 30 days supply | Qty: 60 | Fill #5

## 2016-12-23 MED FILL — ATORVASTATIN 20 MG TABLET: 20 | 30 days supply | Qty: 30 | Fill #5

## 2016-12-23 MED FILL — LIALDA 1.2 GM TABLET SA: 1.2 | 30 days supply | Qty: 60 | Fill #6

## 2017-01-20 DIAGNOSIS — L570 Actinic keratosis: Secondary | ICD-10-CM | POA: Diagnosis not present

## 2017-01-20 MED FILL — ATORVASTATIN 20 MG TABLET: 20 | 30 days supply | Qty: 30 | Fill #6

## 2017-01-20 MED FILL — PROPRANOLOL 20 MG TABLET: 20 | 30 days supply | Qty: 60 | Fill #6

## 2017-01-20 MED FILL — LIALDA 1.2 GM TABLET SA: 1.2 | 30 days supply | Qty: 60 | Fill #7

## 2017-02-13 MED FILL — ATORVASTATIN 20 MG TABLET: 20 | 30 days supply | Qty: 30 | Fill #7

## 2017-02-13 MED FILL — LIALDA 1.2 GM TABLET SA: 1.2 | 30 days supply | Qty: 60 | Fill #8

## 2017-03-17 MED FILL — ATORVASTATIN 20 MG TABLET: 20 | 30 days supply | Qty: 30 | Fill #8

## 2017-03-17 MED FILL — PROPRANOLOL 20 MG TABLET: 20 | 30 days supply | Qty: 60 | Fill #7

## 2017-03-17 MED FILL — LIALDA 1.2 GM TABLET SA: 1.2 | 30 days supply | Qty: 60 | Fill #9

## 2017-05-05 MED FILL — ATORVASTATIN 20 MG TABLET: 20 | 30 days supply | Qty: 30 | Fill #9

## 2017-05-05 MED FILL — MESALAMINE DR 1.2G TABLET: 1.2 | 30 days supply | Qty: 60 | Fill #10

## 2017-05-07 DIAGNOSIS — G518 Other disorders of facial nerve: Secondary | ICD-10-CM | POA: Diagnosis not present

## 2017-05-07 DIAGNOSIS — H8102 Meniere's disease, left ear: Secondary | ICD-10-CM | POA: Diagnosis not present

## 2017-05-07 DIAGNOSIS — H903 Sensorineural hearing loss, bilateral: Secondary | ICD-10-CM | POA: Diagnosis not present

## 2017-05-07 DIAGNOSIS — H6981 Other specified disorders of Eustachian tube, right ear: Secondary | ICD-10-CM | POA: Diagnosis not present

## 2017-05-07 DIAGNOSIS — H7201 Central perforation of tympanic membrane, right ear: Secondary | ICD-10-CM | POA: Diagnosis not present

## 2017-06-01 ENCOUNTER — Encounter: Payer: Self-pay | Admitting: Family Medicine

## 2017-06-01 ENCOUNTER — Ambulatory Visit (INDEPENDENT_AMBULATORY_CARE_PROVIDER_SITE_OTHER): Payer: PPO | Admitting: Family Medicine

## 2017-06-01 VITALS — BP 134/84 | HR 66 | Temp 98.1°F | Ht 69.0 in | Wt 200.2 lb

## 2017-06-01 DIAGNOSIS — Z0000 Encounter for general adult medical examination without abnormal findings: Secondary | ICD-10-CM | POA: Diagnosis not present

## 2017-06-01 DIAGNOSIS — R012 Other cardiac sounds: Secondary | ICD-10-CM | POA: Diagnosis not present

## 2017-06-01 DIAGNOSIS — Z1159 Encounter for screening for other viral diseases: Secondary | ICD-10-CM | POA: Diagnosis not present

## 2017-06-01 DIAGNOSIS — K5080 Crohn's disease of both small and large intestine without complications: Secondary | ICD-10-CM | POA: Diagnosis not present

## 2017-06-01 LAB — LIPID PANEL
Cholesterol: 160 mg/dL (ref 0–200)
HDL: 31.6 mg/dL — ABNORMAL LOW (ref >39.00)
LDL Cholesterol: 93 mg/dL (ref 0–99)
NonHDL: 128.54
Total CHOL/HDL Ratio: 5
Triglycerides: 177 mg/dL — ABNORMAL HIGH (ref 0.0–149.0)
VLDL: 35.4 mg/dL (ref 0.0–40.0)

## 2017-06-01 LAB — CBC
HCT: 46.1 % (ref 39.0–52.0)
Hemoglobin: 15.6 g/dL (ref 13.0–17.0)
MCHC: 33.8 g/dL (ref 30.0–36.0)
MCV: 89.6 fl (ref 78.0–100.0)
Platelets: 191 10*3/uL (ref 150.0–400.0)
RBC: 5.15 Mil/uL (ref 4.22–5.81)
RDW: 13.8 % (ref 11.5–15.5)
WBC: 8.6 10*3/uL (ref 4.0–10.5)

## 2017-06-01 LAB — COMPREHENSIVE METABOLIC PANEL
ALT: 22 U/L (ref 0–53)
AST: 24 U/L (ref 0–37)
Albumin: 4.1 g/dL (ref 3.5–5.2)
Alkaline Phosphatase: 91 U/L (ref 39–117)
BUN: 13 mg/dL (ref 6–23)
CO2: 29 mEq/L (ref 19–32)
Calcium: 10.8 mg/dL — ABNORMAL HIGH (ref 8.4–10.5)
Chloride: 107 mEq/L (ref 96–112)
Creatinine, Ser: 0.82 mg/dL (ref 0.40–1.50)
GFR: 99.64 mL/min (ref >60.00)
Glucose, Bld: 91 mg/dL (ref 70–99)
Potassium: 4.3 mEq/L (ref 3.5–5.1)
Sodium: 141 mEq/L (ref 135–145)
Total Bilirubin: 0.6 mg/dL (ref 0.2–1.2)
Total Protein: 6.5 g/dL (ref 6.0–8.3)

## 2017-06-01 NOTE — Progress Notes (Signed)
Subjective:  Water quality scientist, CMA, acting as scribe for Dr. Juleen Armstrong.  Shaun Armstrong is a 66 y.o. male and is here for a comprehensive physical exam.  HPI: No concerns today. Wants to establish and switch to a male physician in th fall.  Health Maintenance Due  Topic Date Due  . Hepatitis C Screening  1951/02/03  . PNA vac Low Risk Adult (1 of 2 - PCV13) 07/15/2015   PMHx, SurgHx, SocialHx, Medications, and Allergies were reviewed in the Visit Navigator and updated as appropriate.   Past Medical History:  Diagnosis Date  . 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 Crohn's disease ([place])   . Deafness in right ear, wears a microphone   . Decreased peripheral vision 11/04/2011  . Essential tremor   . HLD (hyperlipidemia)   . Inguinal hernia 07/2016  . Wears hearing aid, left ear    Past Surgical History:  Procedure Laterality Date  . ACOUSTIC NEUROMA RESECTION Right   . BROW LIFT AND BLEPHAROPLASTY Bilateral 10/11/2012  . CATARACT EXTRACTION W/ INTRAOCULAR LENS  IMPLANT, BILATERAL Bilateral   . INGUINAL HERNIA REPAIR Right 07/16/2016   Procedure: LAPAROSCOPIC RIGHT INGUINAL HERNIA;  Surgeon: Shaun Keens, MD;  Location: [place];  Service: General;  Laterality: Right;  . INSERTION OF MESH Right 07/16/2016   Procedure: INSERTION OF MESH;  Surgeon: Shaun Keens, MD;  Location: [place];  Service: General;  Laterality: Right;  . RETINAL DETACHMENT SURGERY Left   . TONSILLECTOMY     History reviewed. No pertinent family history. Social History  Substance Use Topics  . Smoking status: Former Research scientist (life sciences)  . Smokeless tobacco: Never Used     Comment: Quit 30 years ago  . Alcohol use Yes     Comment: socially   Review of Systems:   Review of Systems  Constitutional: Negative for chills, fever, malaise/fatigue and weight loss.  Respiratory: Negative for cough, shortness of breath and wheezing.   Cardiovascular: Negative for chest pain, palpitations and leg  swelling.  Gastrointestinal: Negative for abdominal pain, constipation, diarrhea, nausea and vomiting.  Genitourinary: Negative for dysuria and urgency.  Musculoskeletal: Negative for joint pain and myalgias.  Skin: Negative for rash.  Neurological: Negative for dizziness and headaches.  Psychiatric/Behavioral: Negative for depression, substance abuse and suicidal ideas. The patient is not nervous/anxious.    Objective:    Vitals:   06/01/17 1008  BP: 134/84  Pulse: 66  Temp: 98.1 F (36.7 C)   Body mass index is 29.56 kg/m.  General  Alert, cooperative, no distress, appears stated age  Head:  Normocephalic, without obvious abnormality, atraumatic  Eyes:  PERRL, conjunctiva/corneas clear, EOM's intact, fundi benign, both eyes       Ears:  Normal TM's and external ear canals, both ears  Nose: Nares normal, septum midline, mucosa normal, no drainage or sinus tenderness  Throat: Lips, mucosa, and tongue normal; teeth and gums normal  Neck: Supple, symmetrical, trachea midline, no adenopathy;     thyroid:  No enlargement/tenderness/nodules; no carotid bruit or JVD  Back:   Symmetric, no curvature, ROM normal, no CVA tenderness  Lungs:   Clear to auscultation bilaterally, respirations unlabored  Chest wall:  No tenderness or deformity  Heart:  Regular rate and rhythm, abnormal S2  Abdomen:   Soft, non-tender, bowel sounds active all four quadrants, no masses, no organomegaly  Extremities: Extremities normal, atraumatic, no cyanosis or edema  Prostate : Not done.   Skin: Skin color, texture, turgor  normal, no rashes or lesions  Lymph nodes: Cervical, supraclavicular, and axillary nodes normal  Neurologic: CNII-XII grossly intact. Normal strength, sensation and reflexes throughout    AssessmentPlan:   Shaun Armstrong was seen today for annual exam.  Diagnoses and all orders for this visit:  Routine health maintenance -     CBC -     Comprehensive metabolic panel -     Lipid  panel  Encounter for screening for other viral diseases -     Hepatitis C antibody  Abnormal heart sounds -     ECHOCARDIOGRAM COMPLETE; Future  Crohn's disease of both small and large intestine without complication ([place]) Comments: Doing well on current medications.    Patient Counseling: [x]   Nutrition: Stressed importance of moderation in sodium/caffeine intake, saturated fat and cholesterol, caloric balance, sufficient intake of fresh fruits, vegetables, and fiber  [x]   Stressed the importance of regular exercise.   []   Substance Abuse: Discussed cessation/primary prevention of tobacco, alcohol, or other drug use; driving or other dangerous activities under the influence; availability of treatment for abuse.   [x]   Injury prevention: Discussed safety belts, safety helmets, smoke detector, smoking near bedding or upholstery.   []   Sexuality: Discussed sexually transmitted diseases, partner selection, use of condoms, avoidance of unintended pregnancy  and contraceptive alternatives.   [x]   Dental health: Discussed importance of regular tooth brushing, flossing, and dental visits.  [x]   Health maintenance and immunizations reviewed. Please refer to Health maintenance section.    Shaun Deutscher, DO [facility] served as Education administrator during this visit. History, Physical, and Plan performed by medical provider. The above documentation has been reviewed and is accurate and complete. Shaun Armstrong, D.O.

## 2017-06-01 NOTE — Progress Notes (Deleted)
Shaun Armstrong is a 66 y.o. male is here to [facility].   Patient Care Team: Briscoe Deutscher, DO as PCP - General (Family Medicine)   History of Present Illness:   Gertie Exon, CMA, acting as scribe for Dr. Juleen China.  HPI  Health Maintenance Due  Topic Date Due  . Hepatitis C Screening  04-05-1951  . PNA vac Low Risk Adult (1 of 2 - PCV13) 07/15/2015    Immunization History  Administered Date(s) Administered  . Tdap 11/12/2009    PMHx, SurgHx, SocialHx, Medications, and Allergies were reviewed in the Visit Navigator and updated as appropriate.   Past Medical History:  Diagnosis Date  . Crohn's disease ([place])   . Deafness in right ear    wears a microphone in right ear  . Essential tremor   . Hearing impaired   . High cholesterol   . Inguinal hernia 07/2016  . Poison ivy 07/10/2016   left leg and lower trunk  . Wears hearing aid    left ear    Past Surgical History:  Procedure Laterality Date  . ACOUSTIC NEUROMA RESECTION Right   . BROW LIFT AND BLEPHAROPLASTY Bilateral 10/11/2012  . CATARACT EXTRACTION W/ INTRAOCULAR LENS  IMPLANT, BILATERAL Bilateral   . INGUINAL HERNIA REPAIR Right 07/16/2016   Procedure: LAPAROSCOPIC RIGHT INGUINAL HERNIA;  Surgeon: Coralie Keens, MD;  Location: [place];  Service: General;  Laterality: Right;  . INSERTION OF MESH Right 07/16/2016   Procedure: INSERTION OF MESH;  Surgeon: Coralie Keens, MD;  Location: [place];  Service: General;  Laterality: Right;  . RETINAL DETACHMENT SURGERY Left   . TONSILLECTOMY     History reviewed. No pertinent family history. Social History  Substance Use Topics  . Smoking status: Former Research scientist (life sciences)  . Smokeless tobacco: Never Used     Comment: Quit 30 years ago  . Alcohol use Yes     Comment: socially    Current Medications and Allergies:   Current Outpatient Prescriptions:  .  atorvastatin (LIPITOR) 20 MG tablet, Take 20 mg by mouth daily., Disp: , Rfl:    .  cholecalciferol (VITAMIN D) 1000 units tablet, Take 1,000 Units by mouth daily., Disp: , Rfl:  .  glucosamine-chondroitin 500-400 MG tablet, Take 1 tablet by mouth 3 (three) times daily., Disp: , Rfl:  .  mesalamine (LIALDA) 1.2 g EC tablet, Take 2.4 g by mouth daily with breakfast., Disp: , Rfl:  .  Multiple Vitamin (MULTIVITAMIN) tablet, Take 1 tablet by mouth daily., Disp: , Rfl:  .  Omega-3 Fatty Acids (FISH OIL PO), Take by mouth., Disp: , Rfl:  .  propranolol (INDERAL) 10 MG tablet, Take 10 mg by mouth 3 (three) times daily., Disp: , Rfl:   Allergies  Allergen Reactions  . Other Itching    OCTOPUS  . Ceftin [Cefuroxime Axetil] Other (See Comments)   Review of Systems:   Review of Systems  Constitutional: Negative for chills and fever.  HENT: Negative for congestion, ear pain and sore throat.   Eyes: Negative for blurred vision and pain.  Respiratory: Negative for cough and shortness of breath.   Cardiovascular: Negative for chest pain and palpitations.  Gastrointestinal: Negative for abdominal pain, nausea and vomiting.  Genitourinary: Negative for frequency.  Musculoskeletal: Negative for back pain and neck pain.  Skin: Negative for rash.  Neurological: Negative for dizziness, loss of consciousness, weakness and headaches.  Endo/Heme/Allergies: Does not bruise/bleed easily.  Psychiatric/Behavioral: Negative for depression and  memory loss.    Vitals:   Vitals:   06/01/17 1008  BP: 134/84  Pulse: 66  Temp: 98.1 F (36.7 C)  TempSrc: Oral  SpO2: 95%  Weight: 200 lb 3.2 oz (90.8 kg)  Height: 5' 9"  (1.753 m)     Body mass index is 29.56 kg/m.  Physical Exam:   Physical Exam  No results found for this or any previous visit.  Assessment and Plan:   ***

## 2017-06-02 LAB — HEPATITIS C ANTIBODY: HCV Ab: NEGATIVE

## 2017-06-02 MED FILL — ATORVASTATIN 20 MG TABLET: 20 | 30 days supply | Qty: 30 | Fill #10

## 2017-06-08 ENCOUNTER — Telehealth: Payer: Self-pay | Admitting: Family Medicine

## 2017-06-08 MED FILL — MESALAMINE DR 1.2G TABLET: 1.2 | 30 days supply | Qty: 60 | Fill #0

## 2017-06-08 NOTE — Telephone Encounter (Addendum)
ROI fax to [facility] at [place],  Dr. Lorre Munroe Kidney & Dr. Collene Mares

## 2017-06-16 ENCOUNTER — Other Ambulatory Visit: Payer: Self-pay

## 2017-06-16 ENCOUNTER — Ambulatory Visit (HOSPITAL_COMMUNITY): Payer: PPO | Attending: Internal Medicine

## 2017-06-16 DIAGNOSIS — E785 Hyperlipidemia, unspecified: Secondary | ICD-10-CM | POA: Diagnosis not present

## 2017-06-16 DIAGNOSIS — Z87891 Personal history of nicotine dependence: Secondary | ICD-10-CM | POA: Diagnosis not present

## 2017-06-16 DIAGNOSIS — I351 Nonrheumatic aortic (valve) insufficiency: Secondary | ICD-10-CM | POA: Insufficient documentation

## 2017-06-16 DIAGNOSIS — R012 Other cardiac sounds: Secondary | ICD-10-CM | POA: Diagnosis not present

## 2017-06-16 MED ORDER — PROPRANOLOL HCL 10 MG PO TABS: 10.0000 mg | ORAL_TABLET | Freq: Two times a day (BID) | ORAL | 3 refills | Status: DC

## 2017-06-16 MED FILL — PROPRANOLOL 20 MG TABLET: 20 | 90 days supply | Qty: 90 | Fill #0

## 2017-06-16 NOTE — Telephone Encounter (Signed)
Rec'd from [facility] @ [place] Forwarded 30 pages to [employer] DO

## 2017-06-18 ENCOUNTER — Encounter: Payer: Self-pay | Admitting: Family Medicine

## 2017-06-29 MED FILL — ATORVASTATIN 20 MG TABLET: 20 | 30 days supply | Qty: 30 | Fill #11

## 2017-07-01 MED FILL — MESALAMINE DR 1.2G TABLET: 1.2 | 30 days supply | Qty: 60 | Fill #1

## 2017-07-17 NOTE — Progress Notes (Addendum)
Subjective:   Shaun Armstrong is a 66 y.o. male who presents for an Initial Medicare Annual Wellness Visit.  Pt would like skin tags removed from neck.   Pt would like to switch from Atorvastatin to rosuvastatin.  The Patient was informed that the wellness visit is to identify future health risk and educate and initiate measures that can reduce risk for increased disease through the lifespan.   Describes health as fair, good or great? "Great"  Review of Systems  No ROS.  Medicare Wellness Visit. Additional risk factors are reflected in the social history.  Cardiac Risk Factors include: advanced age (>80mn, >>24women);male gender;dyslipidemia   Sleep patterns:  7-8 hrs/night. Wakes up feeling rested. Home Safety/Smoke Alarms: Feels safe in home. Smoke alarms in place.  Living environment; residence and Firearm Safety: Stairs from basement to living floor. One story home. 5 steps into house.  Seat Belt Safety/Bike Helmet: Wears seat belt.   Counseling:   Dental- Every 6 months. Dr GDerrek Gu Male:   [facility]-    05/27/2016. Pt goes to Dr BWallis Mart states he goes back in 2 years.  PSA- No results found for: PSA     Objective:    Today's Vitals   07/21/17 0813  BP: 128/70  Pulse: (!) 51  Resp: 16  SpO2: 97%  Weight: 198 lb 9.6 oz (90.1 kg)  Height: 5' 9"  (1.753 m)   Body mass index is 29.33 kg/m.  Current Medications (verified) Outpatient Encounter Prescriptions as of 07/21/2017  Medication Sig  . atorvastatin (LIPITOR) 20 MG tablet Take 20 mg by mouth daily.  . cholecalciferol (VITAMIN D) 1000 units tablet Take 5,000 Units by mouth every other day.   .Marland Kitchenglucosamine-chondroitin 500-400 MG tablet Take 1 tablet by mouth daily.   . mesalamine (LIALDA) 1.2 g EC tablet Take 2.4 g by mouth daily with breakfast.  . Multiple Vitamin (MULTIVITAMIN) tablet Take 1 tablet by mouth daily.  . Omega-3 Fatty Acids (FISH OIL PO) Take 2,000 mg by mouth daily.   . propranolol (INDERAL)  10 MG tablet Take 1 tablet (10 mg total) by mouth 2 (two) times daily. (Patient taking differently: Take 20 mg by mouth 2 (two) times daily. )   No facility-administered encounter medications on file as of 07/21/2017.     Allergies (verified) Other and Ceftin [cefuroxime axetil]   History: Past Medical History:  Diagnosis Date  . 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 Crohn's disease ([place]   . Deafness in right ear, wears a microphone   . Decreased peripheral vision 11/04/2011  . Essential tremor   . HLD (hyperlipidemia)   . Inguinal hernia 07/2016  . Inguinal hernia   . Retinal detachment   . Wears hearing aid, left ear    Past Surgical History:  Procedure Laterality Date  . ACOUSTIC NEUROMA RESECTION Right   . BROW LIFT AND BLEPHAROPLASTY Bilateral 10/11/2012  . CATARACT EXTRACTION W/ INTRAOCULAR LENS  IMPLANT, BILATERAL Bilateral   . INGUINAL HERNIA REPAIR Right 07/16/2016   Procedure: LAPAROSCOPIC RIGHT INGUINAL HERNIA;  Surgeon: DCoralie Keens MD;  [place]  Service: General;  Laterality: Right;  . INSERTION OF MESH Right 07/16/2016   Procedure: INSERTION OF MESH;  Surgeon: DCoralie Keens MD;  [place]  Service: General;  Laterality: Right;  . RETINAL DETACHMENT SURGERY Left   . TONSILLECTOMY     Family History  Problem Relation Age of Onset  . Diabetes Mother   .  Dementia Mother   . Diabetes Sister    Social History   Occupational History  . Not on file.   Social History Main Topics  . Smoking status: Former Smoker    Packs/day: 1.00    Years: 4.00    Types: Cigarettes    Quit date: 12/09/1979  . Smokeless tobacco: Never Used     Comment: Quit 30 years ago  . Alcohol use Yes     Comment: socially  . Drug use: No  . Sexual activity: Not on file   Tobacco Counseling Counseling given: Not Answered   Activities of Daily Living In your present state of health, do you have any difficulty performing the  following activities: 07/21/2017  Hearing? N  Vision? N  Difficulty concentrating or making decisions? N  Walking or climbing stairs? N  Dressing or bathing? N  Doing errands, shopping? N  Preparing Food and eating ? N  Using the Toilet? N  In the past six months, have you accidently leaked urine? Y  Comment Infrequently  Do you have problems with loss of bowel control? Y  Comment Infrequently  Managing your Medications? N  Managing your Finances? N  Housekeeping or managing your Housekeeping? N  Some recent data might be hidden    Immunizations and Health Maintenance Immunization History  Administered Date(s) Administered  . Hep A / Hep B 03/03/2013  . Hepatitis A 02/25/2013, 03/03/2013, 03/10/2013, 03/09/2014  . Hepatitis B 02/25/2013, 03/03/2013, 03/10/2013, 03/09/2014  . Influenza-Unspecified 08/22/2010, 09/09/2011, 09/25/2012, 09/18/2014  . Td 06/22/2002  . Tdap 11/12/2009   Health Maintenance Due  Topic Date Due  . PNA vac Low Risk Adult (1 of 2 - PCV13) 07/15/2015  . INFLUENZA VACCINE  07/08/2017    Patient Care Team: Briscoe Deutscher, DO as PCP - General (Family Medicine) Ronald Lobo, MD as Consulting Physician (Gastroenterology)  Indicate any recent Medical Services you may have received from other than [facility] providers in the past year (date may be approximate).    Assessment:   This is a routine wellness examination for [name]. Physical assessment deferred to PCP.   Hearing/Vision screen Hearing Screening Comments: Wears hearing aid in the left ear and microphone in the right. Vision Screening Comments: [facility], nearly 1 year since last visit. Dr Satira Sark. Wears reading glasses.  Dietary issues and exercise activities discussed: Current Exercise Habits: Home exercise routine, Type of exercise: Other - see comments;walking (Golf 1 day every other week. Exercise class on saturday. Also works 1 day a a week in the yard for 6 hours.), Time  (Minutes): 60, Frequency (Times/Week): 4, Weekly Exercise (Minutes/Week): 240, Intensity: Moderate, Exercise limited by: None identified   Diet (meal preparation, eat out, water intake, caffeinated beverages, dairy products, fruits and vegetables): 3 meals/day. Pt states he has cut back on his snacking. Snacks on walnuts, pecans. 75% of meals cooked at home. Pt states he makes good choices when he "mostly" eats out. States he has cut back on sweets. Limits processed foods, drinks 4 glasses of water.  Discussed increasing water intake.   Goals    None     Depression Screen PHQ 2/9 Scores 07/21/2017 06/01/2017  PHQ - 2 Score 0 0    Fall Risk Fall Risk  07/21/2017 06/01/2017  Falls in the past year? No No    Cognitive Function: Ad8 score reviewed for issues:  Issues making decisions:no  Less interest in hobbies / activities:no  Repeats questions, stories (family complaining):no  Trouble using ordinary gadgets (  microwave, computer, phone):no  Forgets the month or year: no  Mismanaging finances: no  Remembering appts:no  Daily problems with thinking and/or memory:no Ad8 score is=0         Screening Tests Health Maintenance  Topic Date Due  . PNA vac Low Risk Adult (1 of 2 - PCV13) 07/15/2015  . INFLUENZA VACCINE  07/08/2017  . TETANUS/TDAP  11/13/2019  . COLONOSCOPY  05/27/2026  . Hepatitis C Screening  Completed        Plan:   Follow up with PCP as directed.  PCV13- Pt states he does not believe he is supposed to get these. I gave pt handouts and asked him to speak to PCP when he returns.  Pt is scheduling an appt with Dr [name] for skin tag removal.  Rosuvastatin order placed and discontinued atorvastatin per pt request and Dr Alcario Drought agreement.    I have personally reviewed and noted the following in the patient's chart:   . Medical and social history . Use of alcohol, tobacco or illicit drugs  . Current medications and supplements . Functional ability  and status . Nutritional status . Physical activity . Advanced directives . List of other physicians . Vitals . Screenings to include cognitive, depression, and falls . Referrals and appointments  In addition, I have reviewed and discussed with patient certain preventive protocols, quality metrics, and best practice recommendations. A written personalized care plan for preventive services as well as general preventive health recommendations were provided to patient.     Ree Edman, RN   07/21/2017   I have personally reviewed the Medicare Annual Wellness questionnaire and have noted 1. The patient's medical and social history 2. Their use of alcohol, tobacco or illicit drugs 3. Their current medications and supplements 4. The patient's functional ability including ADL's, fall risks, home safety risks and hearing or visual impairment. 5. Diet and physical activities 6. Evidence for depression or mood disorders 7. Reviewed Updated provider list, see scanned forms and [facility] Snapshot.   The patients weight, height, BMI and visual acuity have been recorded in the chart I have made referrals, counseling and provided education to the patient based review of the above and I have provided the pt with a written personalized care plan for preventive services.  I have provided the patient with a copy of your personalized plan for preventive services. Instructed to take the time to review along with their updated medication list.  Briscoe Deutscher, D.O. [place], [facility]

## 2017-07-17 NOTE — Progress Notes (Signed)
Pre visit review using our clinic review tool, if applicable. No additional management support is needed unless otherwise documented below in the visit note. 

## 2017-07-21 ENCOUNTER — Ambulatory Visit (INDEPENDENT_AMBULATORY_CARE_PROVIDER_SITE_OTHER): Payer: PPO | Admitting: *Deleted

## 2017-07-21 ENCOUNTER — Encounter: Payer: Self-pay | Admitting: *Deleted

## 2017-07-21 VITALS — BP 128/70 | HR 51 | Resp 16 | Ht 69.0 in | Wt 198.6 lb

## 2017-07-21 DIAGNOSIS — E785 Hyperlipidemia, unspecified: Secondary | ICD-10-CM

## 2017-07-21 DIAGNOSIS — Z0000 Encounter for general adult medical examination without abnormal findings: Secondary | ICD-10-CM

## 2017-07-21 MED ORDER — ROSUVASTATIN CALCIUM 20 MG PO TABS: 20.0000 mg | ORAL_TABLET | Freq: Every day | ORAL | 3 refills | Status: DC

## 2017-07-21 NOTE — Patient Instructions (Addendum)
Shaun Armstrong ,  Bring a copy of your advance directives to your next office visit.  Thank you for taking time to come for your Medicare Wellness Visit. I appreciate your ongoing commitment to your health goals. Please review the following plan we discussed and let me know if I can assist you in the future.    This is a list of the screening recommended for you and due dates:  Health Maintenance  Topic Date Due  . Pneumonia vaccines (1 of 2 - PCV13) 07/15/2015  . Flu Shot  07/08/2017  . Tetanus Vaccine  11/13/2019  . Colon Cancer Screening  05/27/2026  .  Hepatitis C: One time screening is recommended by Center for Disease Control  (CDC) for  adults born from [birth year] through 1966.   Completed    Preventive Care for Adults  A healthy lifestyle and preventive care can promote health and wellness. Preventive health guidelines for adults include the following key practices.  . A routine yearly physical is a good way to check with your health care provider about your health and preventive screening. It is a chance to share any concerns and updates on your health and to receive a thorough exam.  . Visit your dentist for a routine exam and preventive care every 6 months. Brush your teeth twice a day and floss once a day. Good oral hygiene prevents tooth decay and gum disease.  . The frequency of eye exams is based on your age, health, family medical history, use  of contact lenses, and other factors. Follow your health care provider's ecommendations for frequency of eye exams.  . Eat a healthy diet. Foods like vegetables, fruits, whole grains, low-fat dairy products, and lean protein foods contain the nutrients you need without too many calories. Decrease your intake of foods high in solid fats, added sugars, and salt. Eat the right amount of calories for you. Get information about a proper diet from your health care provider, if necessary.  . Regular physical exercise is one of the most important  things you can do for your health. Most adults should get at least 150 minutes of moderate-intensity exercise (any activity that increases your heart rate and causes you to sweat) each week. In addition, most adults need muscle-strengthening exercises on 2 or more days a week.  Silver Sneakers may be a benefit available to you. To determine eligibility, you may visit the website: www.silversneakers.com or contact program at 831-005-2103 Mon-Fri between 8AM-8PM.   . Maintain a healthy weight. The body mass index (BMI) is a screening tool to identify possible weight problems. It provides an estimate of body fat based on height and weight. Your health care provider can find your BMI and can help you achieve or maintain a healthy weight.   For adults 20 years and older: ? A BMI below 18.5 is considered underweight. ? A BMI of 18.5 to 24.9 is normal. ? A BMI of 25 to 29.9 is considered overweight. ? A BMI of 30 and above is considered obese.   . Maintain normal blood lipids and cholesterol levels by exercising and minimizing your intake of saturated fat. Eat a balanced diet with plenty of fruit and vegetables. Blood tests for lipids and cholesterol should begin at age 38 and be repeated every 5 years. If your lipid or cholesterol levels are high, you are over 50, or you are at high risk for heart disease, you may need your cholesterol levels checked more frequently. Ongoing high lipid  and cholesterol levels should be treated with medicines if diet and exercise are not working.  . If you smoke, find out from your health care provider how to quit. If you do not use tobacco, please do not start.  . If you choose to drink alcohol, please do not consume more than 2 drinks per day. One drink is considered to be 12 ounces (355 mL) of beer, 5 ounces (148 mL) of wine, or 1.5 ounces (44 mL) of liquor.  . If you are 54-4 years old, ask your health care provider if you should take aspirin to prevent  strokes.  . Use sunscreen. Apply sunscreen liberally and repeatedly throughout the day. You should seek shade when your shadow is shorter than you. Protect yourself by wearing long sleeves, pants, a wide-brimmed hat, and sunglasses year round, whenever you are outdoors.  . Once a month, do a whole body skin exam, using a mirror to look at the skin on your back. Tell your health care provider of new moles, moles that have irregular borders, moles that are larger than a pencil eraser, or moles that have changed in shape or color.

## 2017-07-27 ENCOUNTER — Other Ambulatory Visit: Payer: Self-pay

## 2017-07-27 DIAGNOSIS — E785 Hyperlipidemia, unspecified: Secondary | ICD-10-CM

## 2017-07-27 MED ORDER — ROSUVASTATIN CALCIUM 10 MG PO TABS: 10.0000 mg | ORAL_TABLET | Freq: Every day | ORAL | 5 refills | Status: DC

## 2017-07-27 MED FILL — ROSUVASTATIN CALCIUM 10 MG: 10 | 30 days supply | Qty: 30 | Fill #0

## 2017-08-03 ENCOUNTER — Ambulatory Visit (INDEPENDENT_AMBULATORY_CARE_PROVIDER_SITE_OTHER): Payer: PPO | Admitting: Family Medicine

## 2017-08-03 ENCOUNTER — Encounter: Payer: Self-pay | Admitting: Family Medicine

## 2017-08-03 VITALS — BP 140/86 | HR 51 | Ht 69.0 in | Wt 201.2 lb

## 2017-08-03 DIAGNOSIS — L570 Actinic keratosis: Secondary | ICD-10-CM

## 2017-08-03 DIAGNOSIS — L578 Other skin changes due to chronic exposure to nonionizing radiation: Secondary | ICD-10-CM

## 2017-08-03 DIAGNOSIS — L989 Disorder of the skin and subcutaneous tissue, unspecified: Secondary | ICD-10-CM

## 2017-08-03 HISTORY — DX: Other skin changes due to chronic exposure to nonionizing radiation: L57.8

## 2017-08-03 NOTE — Assessment & Plan Note (Addendum)
AKs on scalp treated with cryotherapy today. Lesion on neck removed and sent for biopsy. Please see above procedure notes.  Lesion on left temple likely basal cell carcinoma - may needs Mohs given area. Referral to dermatology placed.

## 2017-08-03 NOTE — Progress Notes (Signed)
    Subjective:  Shaun Armstrong is a 66 y.o. male who presents today with a chief complaint of skin lesions.   HPI:  Skin Lesions, New problem to this provider Patient with history of AKs and basal call carcinomas in the past. He has had several lesions removed in the past. Today he is concerned about 3 lesions. Two on his scalp and one on his left neck. They have been present for several months and are worsening. He has had cryotherapy and flurouracil treatment in the past which worked. Does not use sunscreen regularly.  ROS: Per HPI  Social History  Substance Use Topics  . Smoking status: Former Smoker    Packs/day: 1.00    Years: 4.00    Types: Cigarettes    Quit date: 12/09/1979  . Smokeless tobacco: Never Used     Comment: Quit 30 years ago  . Alcohol use Yes     Comment: socially   Objective:  Physical Exam: BP 140/86   Pulse (!) 51   Ht 5' 9"  (1.753 m)   Wt 201 lb 3.2 oz (91.3 kg)   SpO2 99%   BMI 29.71 kg/m   Gen: NAD, resting comfortably Skin: Two discrete AKs noted on top of scalp. Small 4-14m pedunculated lesion on left neck. Small 3-469mlesion on left temple with central umbilication and rolled edges. Several AKs on forearms bilaterally.   Shave Biopsy Procedure Note  Pre-operative Diagnosis: Suspicious lesion  Post-operative Diagnosis: normal  Locations:left necneck  Indications: Diagnostic  Anesthesia: Lidocaine 1% without epinephrine without added sodium bicarbonate  Procedure Details  History of allergy to iodine: no  Patient informed of the risks (including bleeding and infection) and benefits of the  procedure and Written informed consent obtained.  The lesion and surrounding area were given a sterile prep using betadyne and draped in the usual sterile fashion. A scalpel was used to shave an area of skin approximately 1cm by 1cm.  Hemostasis achieved. Antibiotic ointment and a sterile dressing applied.  The specimen was sent for pathologic  examination. The patient tolerated the procedure well.  EBL: 1 ml  Condition: Stable  Complications: none.  Cryotherapy Procedure Note  Pre-operative Diagnosis: Actinic keratosis  Post-operative Diagnosis: Actinic keratosis  Locations: Scalp  Indications: Therapeutic  Anesthesia: None  Patient informed of risks (permanent scarring, infection, light or dark discoloration, bleeding, infection, weakness, numbness and recurrence of the lesion) and benefits of the procedure and written informed consent obtained.  The areas are treated with liquid nitrogen therapy, frozen until ice ball extended 2 mm beyond lesion, allowed to thaw, and treated again. The patient tolerated procedure well.  The patient was instructed on post-op care, warned that there may be blister formation, redness and pain. Recommend OTC analgesia as needed for pain.  Condition: Stable  Complications: none.  Assessment/Plan:  Sun-damaged skin AKs on scalp treated with cryotherapy today. Lesion on neck removed and sent for biopsy. Please see above procedure notes.  Lesion on left temple likely basal cell carcinoma - may needs Mohs given area. Referral to dermatology placed.   CaAlgis GreenhousePaJerline PainMD 08/03/2017 4:56 PM

## 2017-08-11 MED FILL — MESALAMINE DR 1.2G TABLET: 1.2 | 30 days supply | Qty: 60 | Fill #2

## 2017-09-07 MED FILL — ROSUVASTATIN CALCIUM 10 MG: 10 | 30 days supply | Qty: 30 | Fill #1

## 2017-09-08 MED FILL — MESALAMINE DR 1.2G TABLET: 1.2 | 30 days supply | Qty: 60 | Fill #3

## 2017-10-01 MED FILL — ROSUVASTATIN CALCIUM 10 MG: 10 | 30 days supply | Qty: 30 | Fill #2

## 2017-10-13 DIAGNOSIS — L570 Actinic keratosis: Secondary | ICD-10-CM | POA: Diagnosis not present

## 2017-10-13 DIAGNOSIS — D1801 Hemangioma of skin and subcutaneous tissue: Secondary | ICD-10-CM | POA: Diagnosis not present

## 2017-10-13 DIAGNOSIS — L814 Other melanin hyperpigmentation: Secondary | ICD-10-CM | POA: Diagnosis not present

## 2017-10-13 DIAGNOSIS — L821 Other seborrheic keratosis: Secondary | ICD-10-CM | POA: Diagnosis not present

## 2017-10-13 DIAGNOSIS — D229 Melanocytic nevi, unspecified: Secondary | ICD-10-CM | POA: Diagnosis not present

## 2017-10-13 MED FILL — MESALAMINE DR 1.2G TABLET: 1.2 | 30 days supply | Qty: 60 | Fill #0

## 2017-11-02 NOTE — Telephone Encounter (Signed)
No records from Dr. [facility] & Dr.Mann

## 2017-11-03 MED FILL — ROSUVASTATIN CALCIUM 10 MG: 10 | 30 days supply | Qty: 30 | Fill #3

## 2017-11-18 MED FILL — MESALAMINE DR 1.2G TABLET: 1.2 | 30 days supply | Qty: 60 | Fill #1

## 2017-12-02 MED FILL — ROSUVASTATIN CALCIUM 10 MG: 10 | 30 days supply | Qty: 30 | Fill #4

## 2017-12-15 MED FILL — MESALAMINE DR 1.2G TABLET: 1.2 | 30 days supply | Qty: 60 | Fill #2

## 2017-12-22 DIAGNOSIS — H26492 Other secondary cataract, left eye: Secondary | ICD-10-CM | POA: Diagnosis not present

## 2017-12-22 DIAGNOSIS — H31002 Unspecified chorioretinal scars, left eye: Secondary | ICD-10-CM | POA: Diagnosis not present

## 2017-12-22 DIAGNOSIS — H52201 Unspecified astigmatism, right eye: Secondary | ICD-10-CM | POA: Diagnosis not present

## 2017-12-22 DIAGNOSIS — H43813 Vitreous degeneration, bilateral: Secondary | ICD-10-CM | POA: Diagnosis not present

## 2017-12-30 MED FILL — ROSUVASTATIN CALCIUM 10 MG: 10 | 30 days supply | Qty: 30 | Fill #5

## 2018-01-19 MED FILL — MESALAMINE 1.2 GM TBEC: 1.2 | 30 days supply | Qty: 60 | Fill #3

## 2018-01-29 ENCOUNTER — Other Ambulatory Visit: Payer: Self-pay | Admitting: Family Medicine

## 2018-01-29 DIAGNOSIS — E785 Hyperlipidemia, unspecified: Secondary | ICD-10-CM

## 2018-01-29 MED FILL — ROSUVASTATIN CALCIUM 10 MG: 10 | 30 days supply | Qty: 30 | Fill #0

## 2018-02-23 MED FILL — MESALAMINE 1.2 GM TBEC: 1.2 | 30 days supply | Qty: 60 | Fill #4

## 2018-02-25 MED FILL — ROSUVASTATIN CALCIUM 10 MG: 10 | 30 days supply | Qty: 30 | Fill #1

## 2018-03-02 MED FILL — PROPRANOLOL 20 MG TABLET: 20 | 90 days supply | Qty: 90 | Fill #1

## 2018-03-24 MED FILL — MESALAMINE 1.2 GM TBEC: 1.2 | 30 days supply | Qty: 60 | Fill #5

## 2018-04-20 MED FILL — ROSUVASTATIN CALCIUM 10 MG: 10 | 30 days supply | Qty: 30 | Fill #2

## 2018-04-20 MED FILL — MESALAMINE DR 1.2 GM TABLET: 1.2 | 30 days supply | Qty: 60 | Fill #6

## 2018-04-27 DIAGNOSIS — K5010 Crohn's disease of large intestine without complications: Secondary | ICD-10-CM | POA: Diagnosis not present

## 2018-04-29 ENCOUNTER — Emergency Department (HOSPITAL_BASED_OUTPATIENT_CLINIC_OR_DEPARTMENT_OTHER): Payer: PPO

## 2018-04-29 ENCOUNTER — Encounter (HOSPITAL_BASED_OUTPATIENT_CLINIC_OR_DEPARTMENT_OTHER): Payer: Self-pay

## 2018-04-29 ENCOUNTER — Other Ambulatory Visit: Payer: Self-pay

## 2018-04-29 ENCOUNTER — Ambulatory Visit: Payer: Self-pay

## 2018-04-29 ENCOUNTER — Emergency Department (HOSPITAL_BASED_OUTPATIENT_CLINIC_OR_DEPARTMENT_OTHER)
Admission: EM | Admit: 2018-04-29 | Discharge: 2018-04-29 | Disposition: A | Discharge status: Home / Self Care | Payer: PPO | Attending: Emergency Medicine | Admitting: Emergency Medicine

## 2018-04-29 DIAGNOSIS — Z87891 Personal history of nicotine dependence: Secondary | ICD-10-CM | POA: Diagnosis not present

## 2018-04-29 DIAGNOSIS — I82431 Acute embolism and thrombosis of right popliteal vein: Secondary | ICD-10-CM | POA: Diagnosis not present

## 2018-04-29 DIAGNOSIS — M79604 Pain in right leg: Secondary | ICD-10-CM | POA: Diagnosis present

## 2018-04-29 DIAGNOSIS — I82411 Acute embolism and thrombosis of right femoral vein: Secondary | ICD-10-CM

## 2018-04-29 DIAGNOSIS — Z79899 Other long term (current) drug therapy: Secondary | ICD-10-CM | POA: Diagnosis not present

## 2018-04-29 LAB — CBC WITH DIFFERENTIAL/PLATELET
Basophils Absolute: 0 10*3/uL (ref 0.0–0.1)
Basophils Relative: 1 %
Eosinophils Absolute: 0.5 10*3/uL (ref 0.0–0.7)
Eosinophils Relative: 6 %
HCT: 45.8 % (ref 39.0–52.0)
Hemoglobin: 15.3 g/dL (ref 13.0–17.0)
Lymphocytes Relative: 21 %
Lymphs Abs: 1.6 10*3/uL (ref 0.7–4.0)
MCH: 30.3 pg (ref 26.0–34.0)
MCHC: 33.4 g/dL (ref 30.0–36.0)
MCV: 90.7 fL (ref 78.0–100.0)
Monocytes Absolute: 0.7 10*3/uL (ref 0.1–1.0)
Monocytes Relative: 9 %
Neutro Abs: 4.7 10*3/uL (ref 1.7–7.7)
Neutrophils Relative %: 63 %
Platelets: 186 10*3/uL (ref 150–400)
RBC: 5.05 MIL/uL (ref 4.22–5.81)
RDW: 14 % (ref 11.5–15.5)
WBC: 7.4 10*3/uL (ref 4.0–10.5)

## 2018-04-29 LAB — BASIC METABOLIC PANEL
Anion gap: 7 (ref 5–15)
BUN: 20 mg/dL (ref 6–20)
CO2: 28 mmol/L (ref 22–32)
Calcium: 11.2 mg/dL — ABNORMAL HIGH (ref 8.9–10.3)
Chloride: 106 mmol/L (ref 101–111)
Creatinine, Ser: 0.86 mg/dL (ref 0.61–1.24)
GFR calc Af Amer: >60 mL/min (ref >60)
GFR calc non Af Amer: >60 mL/min (ref >60)
Glucose, Bld: 100 mg/dL — ABNORMAL HIGH (ref 65–99)
Potassium: 4.6 mmol/L (ref 3.5–5.1)
Sodium: 141 mmol/L (ref 135–145)

## 2018-04-29 MED ORDER — RIVAROXABAN 20 MG PO TABS: 20.0000 mg | ORAL_TABLET | Freq: Every day | ORAL | Status: DC

## 2018-04-29 MED ORDER — RIVAROXABAN 15 MG PO TABS: 15.0000 mg | ORAL_TABLET | Freq: Two times a day (BID) | ORAL | Status: DC

## 2018-04-29 MED ORDER — RIVAROXABAN (XARELTO) VTE STARTER PACK (15 & 20 MG): ORAL_TABLET | ORAL | 0 refills | Status: DC

## 2018-04-29 MED ORDER — RIVAROXABAN (XARELTO) EDUCATION KIT FOR DVT/PE PATIENTS
PACK | Freq: Once | Status: AC
  Administered 2018-04-29: 23:00:00

## 2018-04-29 MED ORDER — RIVAROXABAN 15 MG PO TABS
15.0000 mg | ORAL_TABLET | Freq: Once | ORAL | Status: AC
  Administered 2018-04-29: 15 mg via ORAL
  Filled 2018-04-29: qty 1

## 2018-04-29 NOTE — ED Notes (Signed)
Instructed on taking Xarelto.  Instruction booklet given.  Pt to follow up in am with pharmacy for further education as well.  Instructed on signs and symptoms of bleeding and what to get checked for.

## 2018-04-29 NOTE — ED Triage Notes (Addendum)
C/o pain to right LE x 5 days-pt also states he has removed multiple ticks-was sent by PCP to r/o DVT-NAD-steady gait

## 2018-04-29 NOTE — Discharge Instructions (Signed)
Your work-up today showed DVT in the right leg.  Please take the Xarelto as directed on the packaging.  Please follow-up with your primary doctor in several days for further reassessment and management.  If any symptoms change or worsen or he develop any new chest pain or shortness of breath, please return to the nearest emergency department immediately.

## 2018-04-29 NOTE — ED Notes (Signed)
ED Provider at bedside. 

## 2018-04-29 NOTE — Telephone Encounter (Signed)
Pt. Called with c/o swelling in the right lower extremity from foot to just below the knee two days ago.  Stated he had onset of a mild cramping in the right lower calf, about 4-5 days ago. Now states the cramping has become worse, and is very noticeable, when he 1st gets up in the morning, and begins to walk on his right leg.  Described a 50 cent size area on anterior lower right leg that is red, warm, and tender.  Also c/o tenderness in lower right calf.  Stated the swelling does not go down overnight.  Stated the swelling is "about 10-15 % greater in the right LE, compared to the left."  Denied chest pain, shortness of breath, or pain upon deep inspiration.  Advised he will need to be eval. In the ER today, to r/o DVT.  Pt. Verb. Understanding.  Agrees to go to the ER.    Reason for Disposition . [1] Thigh, calf, or ankle swelling AND [2] only 1 side  Answer Assessment - Initial Assessment Questions 1. ONSET: "When did the swelling start?" (e.g., minutes, hours, days)     Right lower leg and foot swollen x 3 days  2. LOCATION: "What part of the leg is swollen?"  "Are both legs swollen or just one leg?"     Foot to knee 3. SEVERITY: "How bad is the swelling?" (e.g., localized; mild, moderate, severe)  - Localized - small area of swelling localized to one leg  - MILD pedal edema - swelling limited to foot and ankle, pitting edema < 1/4 inch (6 mm) deep, rest and elevation eliminate most or all swelling  - MODERATE edema - swelling of lower leg to knee, pitting edema > 1/4 inch (6 mm) deep, rest and elevation only partially reduce swelling  - SEVERE edema - swelling extends above knee, facial or hand swelling present      Mild  4. REDNESS: "Does the swelling look red or infected?"     Redness on anterior lower leg, just above ankle ; feels warm ; size of 50 cent piece  5. PAIN: "Is the swelling painful to touch?" If so, ask: "How painful is it?"   (Scale 1-10; mild, moderate or severe)     4/10  ; is off and on as far as intensity  6. FEVER: "Do you have a fever?" If so, ask: "What is it, how was it measured, and when did it start?"     no 7. CAUSE: "What do you think is causing the leg swelling?"     unknown 8. MEDICAL HISTORY: "Do you have a history of heart failure, kidney disease, liver failure, or cancer?"     Hx of crohns disease 9. RECURRENT SYMPTOM: "Have you had leg swelling before?" If so, ask: "When was the last time?" "What happened that time?"     Never experienced before 10. OTHER SYMPTOMS: "Do you have any other symptoms?" (e.g., chest pain, difficulty breathing)    Denied chest discomfort, SOB, or difficulty taking a deep breath  Protocols used: LEG SWELLING AND EDEMA-A-AH

## 2018-04-29 NOTE — Telephone Encounter (Signed)
See note

## 2018-04-29 NOTE — Progress Notes (Signed)
ANTICOAGULATION CONSULT NOTE - Initial Consult  Pharmacy Consult for Xarelto Indication: DVT  Allergies  Allergen Reactions  . Other Itching    OCTOPUS  . Ceftin [Cefuroxime Axetil] Other (See Comments)    Patient Measurements:   Vital Signs: Temp: 98.4 F (36.9 C) (05/23 2112) Temp Source: Oral (05/23 2112) BP: 122/74 (05/23 2318) Pulse Rate: 56 (05/23 2318)  Labs: Recent Labs    04/29/18 2009  HGB 15.3  HCT 45.8  PLT 186  CREATININE 0.86    CrCl cannot be calculated (Unknown ideal weight.).   Medical History: Past Medical History:  Diagnosis Date  . 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 Crohn's disease ([place])   . Deafness in right ear, wears a microphone   . Decreased peripheral vision 11/04/2011  . Essential tremor   . HLD (hyperlipidemia)   . Inguinal hernia 07/2016  . Inguinal hernia   . Retinal detachment   . Wears hearing aid, left ear     Assessment: 19 yoM admitted with new DVT. Patient not previously on anticoagulation. Pharmacy consulted to start Xarelto. CBC and SCr wnl. First dose has already been ordered and given @2315 . Will start next dose in the morning.  Goal of Therapy:  Anticoagulation Monitor platelets by anticoagulation protocol: Yes   Plan:  Start Xarelto 15 mg PO BID x 21 days, followed by Xarelto 20 mg PO daily with supper. Monitor renal function and for signs and symptoms of bleeding.   Thank you for allowing [place] to participate in this patients care.  Jens Som, PharmD Main pharmacy at: [phone] 04/29/2018 11:37 PM

## 2018-04-29 NOTE — ED Provider Notes (Signed)
[facility] EMERGENCY DEPARTMENT Provider Note   CSN: [id number] Arrival date & time: 04/29/18  1752     History   Chief Complaint Chief Complaint  Patient presents with  . Leg Pain    HPI Shaun Armstrong is a 67 y.o. male.  The history is provided by the patient and the spouse.  Leg Pain   This is a new problem. The current episode started more than 2 days ago. The problem occurs constantly. The problem has not changed since onset.The pain is present in the right lower leg. The quality of the pain is described as aching. The pain is moderate. Pertinent negatives include no numbness, full range of motion and no stiffness. He has tried nothing for the symptoms. The treatment provided no relief. There has been no history of extremity trauma. Family history is significant for no rheumatoid arthritis and no gout.    Past Medical History:  Diagnosis Date  . 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 Crohn's disease ([place])   . Deafness in right ear, wears a microphone   . Decreased peripheral vision 11/04/2011  . Essential tremor   . HLD (hyperlipidemia)   . Inguinal hernia 07/2016  . Inguinal hernia   . Retinal detachment   . Wears hearing aid, left ear     Patient Active Problem List   Diagnosis Date Noted  . Sun-damaged skin 08/03/2017  . Essential tremor 06/05/2016  . 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 Crohn's disease of both small and large intestine without complication ([place]) 06/02/9484  . Hyperlipidemia 10/05/2012  . Decreased peripheral vision 11/04/2011    Past Surgical History:  Procedure Laterality Date  . ACOUSTIC NEUROMA RESECTION Right   . BROW LIFT AND BLEPHAROPLASTY Bilateral 10/11/2012  . CATARACT EXTRACTION W/ INTRAOCULAR LENS  IMPLANT, BILATERAL Bilateral   . HERNIA REPAIR    . INGUINAL HERNIA REPAIR Right 07/16/2016   Procedure: LAPAROSCOPIC RIGHT INGUINAL HERNIA;  Surgeon: Coralie Keens, MD;  Location: [facility];  Service: General;   Laterality: Right;  . INSERTION OF MESH Right 07/16/2016   Procedure: INSERTION OF MESH;  Surgeon: Coralie Keens, MD;  Location: [facility];  Service: General;  Laterality: Right;  . RETINAL DETACHMENT SURGERY Left   . TONSILLECTOMY          Home Medications    Prior to Admission medications   Medication Sig Start Date End Date Taking? Authorizing Provider  cholecalciferol (VITAMIN D) 1000 units tablet Take 5,000 Units by mouth every other day.     [provider]  glucosamine-chondroitin 500-400 MG tablet Take 1 tablet by mouth daily.     [provider]  mesalamine (LIALDA) 1.2 g EC tablet Take 2.4 g by mouth daily with breakfast.    [provider]  Multiple Vitamin (MULTIVITAMIN) tablet Take 1 tablet by mouth daily.    [provider]  Omega-3 Fatty Acids (FISH OIL PO) Take 2,000 mg by mouth daily.     [provider]  propranolol (INDERAL) 20 MG tablet Take 20 mg by mouth daily.  06/16/17   [provider]  rosuvastatin (CRESTOR) 10 MG tablet TAKE 1 TABLET (10 MG TOTAL) BY MOUTH DAILY. 01/29/18   Briscoe Deutscher, DO    Family History Family History  Problem Relation Age of Onset  . Diabetes Mother   . Dementia Mother   . Diabetes Sister     Social History Social History   Tobacco Use  . Smoking  status: Former Smoker    Packs/day: 1.00    Years: 4.00    Pack years: 4.00    Types: Cigarettes    Last attempt to quit: 12/09/1979    Years since quitting: 38.4  . Smokeless tobacco: Never Used  . Tobacco comment: Quit 30 years ago  Substance Use Topics  . Alcohol use: Yes    Comment: socially  . Drug use: No     Allergies   Other and Ceftin [cefuroxime axetil]   Review of Systems Review of Systems  Constitutional: Negative for activity change, chills, diaphoresis, fatigue and fever.  HENT: Negative for congestion and rhinorrhea.   Eyes: Negative for visual disturbance.  Respiratory: Negative for  cough, chest tightness, shortness of breath, wheezing and stridor.   Cardiovascular: Negative for chest pain, palpitations and leg swelling.  Gastrointestinal: Negative for abdominal distention, abdominal pain, blood in stool, constipation, diarrhea, nausea and vomiting.  Genitourinary: Negative for difficulty urinating, dysuria and flank pain.  Musculoskeletal: Negative for back pain, gait problem, neck pain, neck stiffness and stiffness.  Skin: Negative for rash and wound.  Neurological: Negative for dizziness, weakness, light-headedness, numbness and headaches.  Psychiatric/Behavioral: Negative for agitation.  All other systems reviewed and are negative.    Physical Exam Updated Vital Signs BP (!) 152/93 (BP Location: Right Arm)   Pulse 64   Temp 98.3 F (36.8 C) (Oral)   Resp 16   SpO2 98%   Physical Exam  Constitutional: He is oriented to person, place, and time. He appears well-developed and well-nourished. No distress.  HENT:  Head: Normocephalic and atraumatic.  Right Ear: External ear normal.  Left Ear: External ear normal.  Nose: Nose normal.  Mouth/Throat: Oropharynx is clear and moist. No oropharyngeal exudate.  Eyes: Pupils are equal, round, and reactive to light. Conjunctivae and EOM are normal.  Neck: Normal range of motion. Neck supple.  Cardiovascular: Normal rate and intact distal pulses.  No murmur heard. Pulmonary/Chest: No stridor. No respiratory distress. He exhibits no tenderness.  Abdominal: Soft. There is no tenderness. There is no rebound and no guarding.  Musculoskeletal: He exhibits edema and tenderness.       Right lower leg: He exhibits tenderness and edema.       Legs: Normal sensation, strength, and pulse in bilateral lower extremities.  Tenderness in the right calf.  Normal knee range of motion.  Neurological: He is alert and oriented to person, place, and time.  Skin: Skin is warm. Capillary refill takes less than 2 seconds. No rash noted. He  is not diaphoretic. No erythema. No pallor.  Nursing note and vitals reviewed.    ED Treatments / Results  Labs (all labs ordered are listed, but only abnormal results are displayed) Labs Reviewed  BASIC METABOLIC PANEL - Abnormal; Notable for the following components:      Result Value   Glucose, Bld 100 (*)    Calcium 11.2 (*)    All other components within normal limits  CBC WITH DIFFERENTIAL/PLATELET    EKG None  Radiology US Venous Img Lower Unilateral Right  Result Date: 04/29/2018 CLINICAL DATA:  68 year old male with 5 days of right lower extremity pain and swelling EXAM: RIGHT LOWER EXTREMITY VENOUS DOPPLER ULTRASOUND TECHNIQUE: Gray-scale sonography with graded compression, as well as color Doppler and duplex ultrasound were performed to evaluate the lower extremity deep venous systems from the level of the common femoral vein and including the common femoral, femoral, profunda femoral, popliteal and calf veins including  the posterior tibial, peroneal and gastrocnemius veins when visible. The superficial great saphenous vein was also interrogated. Spectral Doppler was utilized to evaluate flow at rest and with distal augmentation maneuvers in the common femoral, femoral and popliteal veins. COMPARISON:  None. FINDINGS: Contralateral Common Femoral Vein: Respiratory phasicity is normal and symmetric with the symptomatic side. No evidence of thrombus. Normal compressibility. Common Femoral Vein: No evidence of thrombus. Normal compressibility, respiratory phasicity and response to augmentation. Saphenofemoral Junction: No evidence of thrombus. Normal compressibility and flow on color Doppler imaging. Profunda Femoral Vein: No evidence of thrombus. Normal compressibility and flow on color Doppler imaging. Femoral Vein: Normally patent and compressible in the proximal thigh and mid thigh. However, in the distal thigh, the vessel becomes un compressible. The lumen is expanded and filled  with low-level internal echoes. No evidence of color flow on color Doppler imaging. Findings are consistent with acute occlusive thrombus. Popliteal Vein: Occlusive thrombus extends into the popliteal vein. Calf Veins: Occlusive thrombus extends into the calf veins. Superficial Great Saphenous Vein: No evidence of thrombus. Normal compressibility. Venous Reflux:  None. Other Findings:  None. IMPRESSION: Positive for acute occlusive DVT involving the femoral vein in the distal thigh, the popliteal vein and the visualized calf veins. Electronically Signed   By: Jacqulynn Cadet M.D.   On: 04/29/2018 19:21    Procedures Procedures (including critical care time)  Medications Ordered in ED Medications  Rivaroxaban (XARELTO) tablet 15 mg (15 mg Oral Given 04/29/18 2315)  rivaroxaban [facility]) Education Kit for DVT/PE patients ( Does not apply Given 04/29/18 2316)     Initial Impression / Assessment and Plan / ED Course  I have reviewed the triage vital signs and the nursing notes.  Pertinent labs & imaging results that were available during my care of the patient were reviewed by me and considered in my medical decision making (see chart for details).     WM SAHAGUN is a 67 y.o. male with a past medical history of Crohn's, hyperlipidemia, and recent tick exposures presents with right leg pain and swelling.  Patient sent by PCP for DVT rule out.  Patient reports for last few days he has had right calf swelling and pain.  He thought it was cramping however after seeing PCP today he was sent for further evaluation.  Patient has no complaints of chest pain shortness of breath fever chills or other symptoms.  No history of DVT, PE, or long trips area  On exam, mild swelling and tenderness in right calf.  Normal sensation, strength, and pulses of bilateral lower extremity's.  No laceration or evidence of cellulitis.  Lungs clear, chest nontender.  Abdomen nontender.  Patient appears well.  DVT study  was positive showing clot in the distal right femoral vein, popliteal and lower leg.  There was no evidence of clot in the proximal or mid thigh.  Screening laboratory testing was performed showing normal kidney function.  Patient started on Xarelto.  Given patient's lack of other symptoms, well appearance, and primarily distal clot, a shared decision making conversation was held leading to decision for patient to go home.  Patient will be started on Xarelto and will follow up with PCP in several days for further management.  Patient given information packet and prescription for Xarelto.  I do not feel patient has pulmonary embolism at this time.  Although there is some component of femoral vein occlusion, as it is very distal and patient has had no other symptoms, I  do not feel patient needs admission at this time.  Patient was given extremely strict return precautions and understood plan of care.  Patient had no other questions or concerns and was discharged home in good condition.       Final Clinical Impressions(s) / ED Diagnoses   Final diagnoses:  Acute deep vein thrombosis (DVT) of femoral vein of right lower extremity [facility])    ED Discharge Orders        Ordered    Rivaroxaban 15 & 20 MG TBPK     04/29/18 2307      Clinical Impression: 1. Acute deep vein thrombosis (DVT) of femoral vein of right lower extremity (HCC)     Disposition: Discharge  Condition: Good  I have discussed the results, Dx and Tx plan with the pt(& family if present). He/she/they expressed understanding and agree(s) with the plan. Discharge instructions discussed at great length. Strict return precautions discussed and pt &/or family have verbalized understanding of the instructions. No further questions at time of discharge.    Discharge Medication List as of 04/29/2018 11:07 PM    START taking these medications   Details  Rivaroxaban 15 & 20 MG TBPK Take as directed on package: Start with one 34m tablet by  mouth twice a day with food. On Day 22, switch to one 266mtablet once a day with food., Print        Follow Up: PaVivi BarrackMD [phone]     [id number] [place] YEBXoLagran[phone]     Ionia Schey, ChGwenyth AllegraMD 04/30/18 [phone]

## 2018-04-29 NOTE — ED Notes (Signed)
Lab aware of cbc and bmet orders

## 2018-04-30 ENCOUNTER — Telehealth: Payer: Self-pay | Admitting: Family Medicine

## 2018-04-30 MED FILL — XARELTO STARTER PACK: 15 & 20 | 30 days supply | Qty: 51 | Fill #0

## 2018-04-30 NOTE — Telephone Encounter (Signed)
Dx with DVT. Needs follow up next week with me.

## 2018-04-30 NOTE — Telephone Encounter (Signed)
Seen in ED notes in chart.

## 2018-04-30 NOTE — Telephone Encounter (Signed)
Pt return call per Joellen; not sure of what information was to be given to the pt; spoke with Amber and she too was unsure what information was to be given; she states that someone from office will call the pt back; his best contact number is [phone]; also pt made a hospital follow up appointmejt with Dr Dimas Chyle, [facility] on 05/04/18 at [place]; he verbalizes  Understanding; will route to office for notification of this encounter.

## 2018-04-30 NOTE — Telephone Encounter (Signed)
CRM and message were left with patient in regards to follow up visit with Dr. [name] for DVT.  Patient is already scheduled next week for follow up.

## 2018-04-30 NOTE — Telephone Encounter (Signed)
Left message to return call to our office.  CRM Started ok to give message.

## 2018-05-04 ENCOUNTER — Ambulatory Visit (INDEPENDENT_AMBULATORY_CARE_PROVIDER_SITE_OTHER): Payer: PPO | Admitting: Family Medicine

## 2018-05-04 ENCOUNTER — Encounter: Payer: Self-pay | Admitting: Family Medicine

## 2018-05-04 VITALS — BP 134/74 | HR 49 | Temp 98.0°F | Ht 69.0 in | Wt 200.0 lb

## 2018-05-04 DIAGNOSIS — I82491 Acute embolism and thrombosis of other specified deep vein of right lower extremity: Secondary | ICD-10-CM | POA: Diagnosis not present

## 2018-05-04 DIAGNOSIS — I82409 Acute embolism and thrombosis of unspecified deep veins of unspecified lower extremity: Secondary | ICD-10-CM | POA: Insufficient documentation

## 2018-05-04 DIAGNOSIS — H60501 Unspecified acute noninfective otitis externa, right ear: Secondary | ICD-10-CM

## 2018-05-04 MED ORDER — RIVAROXABAN 20 MG PO TABS: 20.0000 mg | ORAL_TABLET | Freq: Every day | ORAL | 0 refills | Status: DC

## 2018-05-04 NOTE — Assessment & Plan Note (Signed)
Stable.  Patient thought he would only need to be anticoagulated for 6 weeks.  Discussed that standard of care is for a minimum of at least 3 months.  Patient voiced understanding.  We will continue Xarelto for at least 3 months.  He will follow-up with me in about 3 months.  Consider repeat Doppler study at that time per patient request.  Discussed reasons to return to care.

## 2018-05-04 NOTE — Patient Instructions (Addendum)
It was very nice to see you today!  You should be on a blood thinner for at least 3 months after your first blood clot. You can stop taking blood thinners on July 30, 2018.  Please see the ENT specialist for your ear.   Come back to see me in 3 months for your physical, or sooner as needed.  Take care, Dr [name]

## 2018-05-04 NOTE — Progress Notes (Signed)
   Subjective:  Shaun Armstrong is a 67 y.o. male who presents today with a chief complaint of ED follow up for DVT.   HPI:  DVT, new problem Patient noticed pain and swelling in his right leg about a week ago.  Went to the emergency department 5 days ago was diagnosed with DVT in his right distal femoral vein.  He was started on Xarelto and discharged home.  Over the last couple of days pain and swelling have significantly improved.  Denies any obvious precipitating events, though does note he got a deep tissue massage a few days prior to the pain starting.  No history of immobility.  No prior history of VTE.  Xarelto seems to be improving his symptoms.  Right ear drainage, new problem Started a few weeks ago.  Patient was at a water park and got water in his ear.  Since then has had some drainage.  He has a history of permanent right-sided hearing loss that is at its baseline.  ROS: Per HPI  PMH: He reports that he quit smoking about 38 years ago. His smoking use included cigarettes. He has a 4.00 pack-year smoking history. He has never used smokeless tobacco. He reports that he drinks alcohol. He reports that he does not use drugs.   Objective:  Physical Exam: BP 134/74 (BP Location: Left Arm, Patient Position: Sitting, Cuff Size: Normal)   Pulse (!) 49   Temp 98 F (36.7 C) (Oral)   Ht 5' 9"  (1.753 m)   Wt 200 lb (90.7 kg)   SpO2 97%   BMI 29.53 kg/m   Gen: NAD, resting comfortably HEENT: Right EAC with purulent debris.  Tympanic membrane with perforation.  Left EAC normal. MSK: Right calf nontender to palpation.  Neurovascular intact distally.  Assessment/Plan:  Deep venous thrombosis (HCC) Stable.  Patient thought he would only need to be anticoagulated for 6 weeks.  Discussed that standard of care is for a minimum of at least 3 months.  Patient voiced understanding.  We will continue Xarelto for at least 3 months.  He will follow-up with me in about 3 months.  Consider repeat  Doppler study at that time per patient request.  Discussed reasons to return to care.  Otitis externa Given TM perforation, we will refer him to ENT for further management.  Shaun Armstrong. [name], MD 05/04/2018 11:03 AM

## 2018-05-04 NOTE — Telephone Encounter (Signed)
Patient is seeing Dr. [name] today.

## 2018-05-05 DIAGNOSIS — H66001 Acute suppurative otitis media without spontaneous rupture of ear drum, right ear: Secondary | ICD-10-CM | POA: Diagnosis not present

## 2018-05-05 DIAGNOSIS — H60331 Swimmer's ear, right ear: Secondary | ICD-10-CM | POA: Diagnosis not present

## 2018-05-05 DIAGNOSIS — G518 Other disorders of facial nerve: Secondary | ICD-10-CM | POA: Diagnosis not present

## 2018-05-05 DIAGNOSIS — H903 Sensorineural hearing loss, bilateral: Secondary | ICD-10-CM | POA: Diagnosis not present

## 2018-05-05 DIAGNOSIS — H6981 Other specified disorders of Eustachian tube, right ear: Secondary | ICD-10-CM | POA: Diagnosis not present

## 2018-05-05 DIAGNOSIS — H7201 Central perforation of tympanic membrane, right ear: Secondary | ICD-10-CM | POA: Diagnosis not present

## 2018-05-05 DIAGNOSIS — H8102 Meniere's disease, left ear: Secondary | ICD-10-CM | POA: Diagnosis not present

## 2018-05-05 MED FILL — CIPRODEX OTIC SUSPENSION: 0.3-0.1 | 25 days supply | Qty: 8 | Fill #0

## 2018-05-05 MED FILL — CIPROFLOXACIN HCL 500 MG TA: 500 | 10 days supply | Qty: 20 | Fill #0

## 2018-05-11 DIAGNOSIS — H7201 Central perforation of tympanic membrane, right ear: Secondary | ICD-10-CM | POA: Diagnosis not present

## 2018-05-11 DIAGNOSIS — H6981 Other specified disorders of Eustachian tube, right ear: Secondary | ICD-10-CM | POA: Diagnosis not present

## 2018-05-11 DIAGNOSIS — G518 Other disorders of facial nerve: Secondary | ICD-10-CM | POA: Diagnosis not present

## 2018-05-11 DIAGNOSIS — H8102 Meniere's disease, left ear: Secondary | ICD-10-CM | POA: Diagnosis not present

## 2018-05-11 DIAGNOSIS — H903 Sensorineural hearing loss, bilateral: Secondary | ICD-10-CM | POA: Diagnosis not present

## 2018-05-18 MED FILL — MESALAMINE DR 1.2 GM TABLET: 1.2 | 30 days supply | Qty: 60 | Fill #7

## 2018-05-18 MED FILL — XARELTO 20 MG TABLET: 20 | 90 days supply | Qty: 90 | Fill #0

## 2018-05-18 MED FILL — ROSUVASTATIN CALCIUM 10 MG: 10 | 30 days supply | Qty: 30 | Fill #3

## 2018-06-03 MED FILL — PROPRANOLOL 20 MG TABLET: 20 | 90 days supply | Qty: 90 | Fill #2

## 2018-06-06 ENCOUNTER — Emergency Department (HOSPITAL_COMMUNITY)
Admission: EM | Admit: 2018-06-06 | Discharge: 2018-06-07 | Disposition: A | Discharge status: Home / Self Care | Payer: PPO | Attending: Emergency Medicine | Admitting: Emergency Medicine

## 2018-06-06 DIAGNOSIS — Y907 Blood alcohol level of 200-239 mg/100 ml: Secondary | ICD-10-CM | POA: Diagnosis not present

## 2018-06-06 DIAGNOSIS — Z79899 Other long term (current) drug therapy: Secondary | ICD-10-CM | POA: Insufficient documentation

## 2018-06-06 DIAGNOSIS — I959 Hypotension, unspecified: Secondary | ICD-10-CM | POA: Diagnosis not present

## 2018-06-06 DIAGNOSIS — W01198A Fall on same level from slipping, tripping and stumbling with subsequent striking against other object, initial encounter: Secondary | ICD-10-CM | POA: Diagnosis not present

## 2018-06-06 DIAGNOSIS — F10920 Alcohol use, unspecified with intoxication, uncomplicated: Secondary | ICD-10-CM | POA: Insufficient documentation

## 2018-06-06 DIAGNOSIS — W19XXXA Unspecified fall, initial encounter: Secondary | ICD-10-CM | POA: Diagnosis not present

## 2018-06-06 DIAGNOSIS — R404 Transient alteration of awareness: Secondary | ICD-10-CM | POA: Diagnosis not present

## 2018-06-06 DIAGNOSIS — F10929 Alcohol use, unspecified with intoxication, unspecified: Secondary | ICD-10-CM | POA: Diagnosis not present

## 2018-06-06 DIAGNOSIS — Z87891 Personal history of nicotine dependence: Secondary | ICD-10-CM | POA: Diagnosis not present

## 2018-06-06 DIAGNOSIS — R55 Syncope and collapse: Secondary | ICD-10-CM | POA: Diagnosis not present

## 2018-06-06 DIAGNOSIS — F10129 Alcohol abuse with intoxication, unspecified: Secondary | ICD-10-CM | POA: Diagnosis not present

## 2018-06-06 DIAGNOSIS — R4182 Altered mental status, unspecified: Secondary | ICD-10-CM | POA: Diagnosis present

## 2018-06-07 ENCOUNTER — Emergency Department (HOSPITAL_COMMUNITY): Payer: PPO

## 2018-06-07 DIAGNOSIS — R55 Syncope and collapse: Secondary | ICD-10-CM | POA: Diagnosis not present

## 2018-06-07 LAB — COMPREHENSIVE METABOLIC PANEL
ALT: 26 U/L (ref 0–44)
AST: 33 U/L (ref 15–41)
Albumin: 3.6 g/dL (ref 3.5–5.0)
Alkaline Phosphatase: 73 U/L (ref 38–126)
Anion gap: 6 (ref 5–15)
BUN: 16 mg/dL (ref 8–23)
CO2: 30 mmol/L (ref 22–32)
Calcium: 10.6 mg/dL — ABNORMAL HIGH (ref 8.9–10.3)
Chloride: 108 mmol/L (ref 98–111)
Creatinine, Ser: 1.08 mg/dL (ref 0.61–1.24)
GFR calc Af Amer: >60 mL/min (ref >60)
GFR calc non Af Amer: >60 mL/min (ref >60)
Glucose, Bld: 101 mg/dL — ABNORMAL HIGH (ref 70–99)
Potassium: 4.1 mmol/L (ref 3.5–5.1)
Sodium: 144 mmol/L (ref 135–145)
Total Bilirubin: 0.7 mg/dL (ref 0.3–1.2)
Total Protein: 6.3 g/dL — ABNORMAL LOW (ref 6.5–8.1)

## 2018-06-07 LAB — CBC WITH DIFFERENTIAL/PLATELET
Abs Immature Granulocytes: 0 10*3/uL (ref 0.0–0.1)
Basophils Absolute: 0.1 10*3/uL (ref 0.0–0.1)
Basophils Relative: 1 %
Eosinophils Absolute: 0.3 10*3/uL (ref 0.0–0.7)
Eosinophils Relative: 4 %
HCT: 46.6 % (ref 39.0–52.0)
Hemoglobin: 14.5 g/dL (ref 13.0–17.0)
Immature Granulocytes: 1 %
Lymphocytes Relative: 27 %
Lymphs Abs: 2.1 10*3/uL (ref 0.7–4.0)
MCH: 28.9 pg (ref 26.0–34.0)
MCHC: 31.1 g/dL (ref 30.0–36.0)
MCV: 92.8 fL (ref 78.0–100.0)
Monocytes Absolute: 0.6 10*3/uL (ref 0.1–1.0)
Monocytes Relative: 8 %
Neutro Abs: 4.6 10*3/uL (ref 1.7–7.7)
Neutrophils Relative %: 59 %
Platelets: 225 10*3/uL (ref 150–400)
RBC: 5.02 MIL/uL (ref 4.22–5.81)
RDW: 13.2 % (ref 11.5–15.5)
WBC: 7.6 10*3/uL (ref 4.0–10.5)

## 2018-06-07 LAB — ETHANOL: Alcohol, Ethyl (B): 226 mg/dL — ABNORMAL HIGH (ref <10)

## 2018-06-07 NOTE — ED Provider Notes (Signed)
[facility] EMERGENCY DEPARTMENT Provider Note   CSN: [id number] Arrival date & time: 06/06/18  2359     History   Chief Complaint Chief Complaint  Patient presents with  . Fall  . Alcohol Intoxication  . Altered Mental Status    HPI Shaun Armstrong is a 67 y.o. male.  Patient brought to the emergency department by ambulance.  Patient reportedly fell twice tonight.  He has been drinking tonight.  The first fall occurred on grass but then he fell again in hit his head on a wall.  Family reports that he did lose consciousness when he fell the second time.  Patient is clearly intoxicated at arrival.  He does not remember falling.  He reports that he has no pain at this time.  Patient is on Xarelto because of recently diagnosed DVT.     Past Medical History:  Diagnosis Date  . 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 Crohn's disease ([place])   . Deafness in right ear, wears a microphone   . Decreased peripheral vision 11/04/2011  . Essential tremor   . HLD (hyperlipidemia)   . Inguinal hernia 07/2016  . Inguinal hernia   . Retinal detachment   . Wears hearing aid, left ear     Patient Active Problem List   Diagnosis Date Noted  . Deep venous thrombosis ([place]) 05/04/2018  . Sun-damaged skin 08/03/2017  . Essential tremor 06/05/2016  . 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 Crohn's disease of both small and large intestine without complication ([place]) 41/74/0814  . Hyperlipidemia 10/05/2012  . Decreased peripheral vision 11/04/2011    Past Surgical History:  Procedure Laterality Date  . ACOUSTIC NEUROMA RESECTION Right   . BROW LIFT AND BLEPHAROPLASTY Bilateral 10/11/2012  . CATARACT EXTRACTION W/ INTRAOCULAR LENS  IMPLANT, BILATERAL Bilateral   . HERNIA REPAIR    . INGUINAL HERNIA REPAIR Right 07/16/2016   Procedure: LAPAROSCOPIC RIGHT INGUINAL HERNIA;  Surgeon: Coralie Keens, MD;  Location: [facility];  Service: General;  Laterality: Right;  . INSERTION  OF MESH Right 07/16/2016   Procedure: INSERTION OF MESH;  Surgeon: Coralie Keens, MD;  Location: [facility];  Service: General;  Laterality: Right;  . RETINAL DETACHMENT SURGERY Left   . TONSILLECTOMY          Home Medications    Prior to Admission medications   Medication Sig Start Date End Date Taking? Authorizing Provider  cholecalciferol (VITAMIN D) 1000 units tablet Take 5,000 Units by mouth every other day.     [provider]  glucosamine-chondroitin 500-400 MG tablet Take 1 tablet by mouth daily.     [provider]  mesalamine (LIALDA) 1.2 g EC tablet Take 2.4 g by mouth daily with breakfast.    [provider]  Multiple Vitamin (MULTIVITAMIN) tablet Take 1 tablet by mouth daily.    [provider]  Omega-3 Fatty Acids (FISH OIL PO) Take 2,000 mg by mouth daily.     [provider]  propranolol (INDERAL) 20 MG tablet Take 20 mg by mouth daily.  06/16/17   [provider]  rivaroxaban (XARELTO) 20 MG TABS tablet Take 1 tablet (20 mg total) by mouth daily with supper. 05/04/18   Vivi Barrack, MD  Rivaroxaban 15 & 20 MG TBPK Take as directed on package: Start with one 62m tablet by mouth twice a day with food. On Day 22, switch to one 228mtablet once a day with food. 04/29/18  Tegeler, Gwenyth Allegra, MD  rosuvastatin (CRESTOR) 10 MG tablet TAKE 1 TABLET (10 MG TOTAL) BY MOUTH DAILY. 01/29/18   Briscoe Deutscher, DO    Family History Family History  Problem Relation Age of Onset  . Diabetes Mother   . Dementia Mother   . Diabetes Sister     Social History Social History   Tobacco Use  . Smoking status: Former Smoker    Packs/day: 1.00    Years: 4.00    Pack years: 4.00    Types: Cigarettes    Last attempt to quit: 12/09/1979    Years since quitting: 38.5  . Smokeless tobacco: Never Used  . Tobacco comment: Quit 30 years ago  Substance Use Topics  . Alcohol use: Yes    Comment: socially  . Drug use:  No     Allergies   Other and Ceftin [cefuroxime axetil]   Review of Systems Review of Systems  Neurological: Positive for syncope. Negative for headaches.  All other systems reviewed and are negative.    Physical Exam Updated Vital Signs BP 108/72   Pulse 62   Temp 97.9 F (36.6 C) (Oral)   Resp 19   SpO2 97%   Physical Exam  Constitutional: He is oriented to person, place, and time. He appears well-developed and well-nourished. No distress.  HENT:  Head: Normocephalic and atraumatic.  Right Ear: Hearing normal.  Left Ear: Hearing normal.  Nose: Nose normal.  Mouth/Throat: Oropharynx is clear and moist and mucous membranes are normal.  Eyes: Pupils are equal, round, and reactive to light. Conjunctivae and EOM are normal.  Neck: Normal range of motion. Neck supple.  Cardiovascular: Regular rhythm, S1 normal and S2 normal. Exam reveals no gallop and no friction rub.  No murmur heard. Pulmonary/Chest: Effort normal and breath sounds normal. No respiratory distress. He exhibits no tenderness.  Abdominal: Soft. Normal appearance and bowel sounds are normal. There is no hepatosplenomegaly. There is no tenderness. There is no rebound, no guarding, no tenderness at McBurney's point and negative Murphy's sign. No hernia.  Musculoskeletal: Normal range of motion.  Neurological: He is alert and oriented to person, place, and time. He has normal strength. No cranial nerve deficit or sensory deficit. Coordination normal. GCS eye subscore is 4. GCS verbal subscore is 5. GCS motor subscore is 6.  Skin: Skin is warm, dry and intact. No rash noted. No cyanosis.  Psychiatric: He has a normal mood and affect. His behavior is normal. Thought content normal. His speech is slurred. Cognition and memory are impaired.  Nursing note and vitals reviewed.    ED Treatments / Results  Labs (all labs ordered are listed, but only abnormal results are displayed) Labs Reviewed  COMPREHENSIVE  METABOLIC PANEL - Abnormal; Notable for the following components:      Result Value   Glucose, Bld 101 (*)    Calcium 10.6 (*)    Total Protein 6.3 (*)    All other components within normal limits  ETHANOL - Abnormal; Notable for the following components:   Alcohol, Ethyl (B) 226 (*)    All other components within normal limits  CBC WITH DIFFERENTIAL/PLATELET    EKG None  Radiology Ct Head Wo Contrast  Result Date: 06/07/2018 CLINICAL DATA:  Several falls with LOC EXAM: CT HEAD WITHOUT CONTRAST CT CERVICAL SPINE WITHOUT CONTRAST TECHNIQUE: Multidetector CT imaging of the head and cervical spine was performed following the standard protocol without intravenous contrast. Multiplanar CT image reconstructions of the cervical spine  were also generated. COMPARISON:  None. FINDINGS: CT HEAD FINDINGS Brain: Motion degraded study. No gross acute territorial infarction hemorrhage or mass. Mild encephalomalacia in the right cerebellum. Ventricle size within normal limits. Mild atrophy. Vascular: No hyperdense vessels.  No unexpected calcification Skull: Right suboccipital craniectomy.  No gross fracture Sinuses/Orbits: Opacified left maxillary sinus with moderate mucosal thickening in the right maxillary sinus. Other: None CT CERVICAL SPINE FINDINGS Alignment: Motion degradation. No subluxation. Facet alignment within normal limits Skull base and vertebrae: No acute fracture. No primary bone lesion or focal pathologic process. Soft tissues and spinal canal: No prevertebral fluid or swelling. No visible canal hematoma. Disc levels:  Degenerative changes at C5-C6. Upper chest: Negative. Other: None IMPRESSION: 1. Motion degraded CT of the brain. No gross acute intracranial abnormality allowing for patient motion. Prior right suboccipital craniectomy with encephalomalacia in the right cerebellar hemisphere 2. Motion degraded CT of the cervical spine limits evaluation for fracture. No gross acute osseous  abnormality is seen. Electronically Signed   By: Donavan Foil M.D.   On: 06/07/2018 01:08   Ct Cervical Spine Wo Contrast  Result Date: 06/07/2018 CLINICAL DATA:  Several falls with LOC EXAM: CT HEAD WITHOUT CONTRAST CT CERVICAL SPINE WITHOUT CONTRAST TECHNIQUE: Multidetector CT imaging of the head and cervical spine was performed following the standard protocol without intravenous contrast. Multiplanar CT image reconstructions of the cervical spine were also generated. COMPARISON:  None. FINDINGS: CT HEAD FINDINGS Brain: Motion degraded study. No gross acute territorial infarction hemorrhage or mass. Mild encephalomalacia in the right cerebellum. Ventricle size within normal limits. Mild atrophy. Vascular: No hyperdense vessels.  No unexpected calcification Skull: Right suboccipital craniectomy.  No gross fracture Sinuses/Orbits: Opacified left maxillary sinus with moderate mucosal thickening in the right maxillary sinus. Other: None CT CERVICAL SPINE FINDINGS Alignment: Motion degradation. No subluxation. Facet alignment within normal limits Skull base and vertebrae: No acute fracture. No primary bone lesion or focal pathologic process. Soft tissues and spinal canal: No prevertebral fluid or swelling. No visible canal hematoma. Disc levels:  Degenerative changes at C5-C6. Upper chest: Negative. Other: None IMPRESSION: 1. Motion degraded CT of the brain. No gross acute intracranial abnormality allowing for patient motion. Prior right suboccipital craniectomy with encephalomalacia in the right cerebellar hemisphere 2. Motion degraded CT of the cervical spine limits evaluation for fracture. No gross acute osseous abnormality is seen. Electronically Signed   By: Donavan Foil M.D.   On: 06/07/2018 01:08    Procedures Procedures (including critical care time)  Medications Ordered in ED Medications - No data to display   Initial Impression / Assessment and Plan / ED Course  I have reviewed the triage  vital signs and the nursing notes.  Pertinent labs & imaging results that were available during my care of the patient were reviewed by me and considered in my medical decision making (see chart for details).     Patient presents to the ER for evaluation after a fall.  Patient is intoxicated at arrival.  He is without complaints, but he does take blood thinners because of a DVT.  He therefore underwent CT head and cervical spine to further evaluate.  No acute abnormality noted.  Patient monitored here in the ER, has done well.  Remainder of work-up unremarkable.  Repeat evaluation after more sober does not reveal any evidence of other injury.  Will discharge, follow-up as needed.  Final Clinical Impressions(s) / ED Diagnoses   Final diagnoses:  Alcoholic intoxication without complication ([place])  Fall, initial encounter    ED Discharge Orders    None       Orpah Greek, MD 06/07/18 [phone]

## 2018-06-07 NOTE — ED Notes (Signed)
Iv removed catheter intact

## 2018-06-07 NOTE — ED Notes (Signed)
Pt states he is not sure why he is here. Pt denies any pain. Pt is cao x2, knows name and DOB. Pt knows month and date but not the year.

## 2018-06-07 NOTE — ED Triage Notes (Signed)
Pt BIB [facility] for falls and altered mental status. Pt was out with family had one more beer than normal and experienced two falls. First on was on grass and he did not LOC. Second was against a wall and LOC of approx 2 minutes. Ptis A+Ox2 during triage.

## 2018-06-22 MED FILL — MESALAMINE 1.2 GM TBEC: 1.2 | 30 days supply | Qty: 60 | Fill #8

## 2018-06-22 MED FILL — ROSUVASTATIN CALCIUM 10 MG: 10 | 30 days supply | Qty: 30 | Fill #4

## 2018-07-15 MED FILL — ROSUVASTATIN CALCIUM 10 MG: 10 | 30 days supply | Qty: 30 | Fill #5

## 2018-07-15 MED FILL — MESALAMINE 1.2 GM TBEC: 1.2 | 30 days supply | Qty: 60 | Fill #9

## 2018-07-21 NOTE — Progress Notes (Signed)
Subjective:   Shaun Armstrong is a 67 y.o. MALE who presents for Medicare Annual (Subsequent) preventive examination.  The Patient was informed that the wellness visit is to identify future health risk and educate and initiate measures that can reduce risk for increased disease through the lifespan.   Describes health as fair, good or great? "Great" Going golf outing with buddies  Going to [place] this weekend Enjoys his life  Reports health as good   Diet Triglycerides 177   Cutting back on meals 6 months to a year has been eating 2 meals Eats breakfast Try to have a salad at dinner  Big meal at lunch; eats at home more  Does not do fast foods, meat and vegetables    Exercise HDL is low 31;  [place]   ETOH occasionally - 3 beers and couldn't get him awake but states that has happened x 4 times in his lifetime States he waited x 10 days prior to drinking   Smoking hx - will do AAA   Health Maintenance Due  Topic Date Due  . PNA vac Low Risk Adult (1 of 2 - PCV13) 07/15/2015  . INFLUENZA VACCINE  07/08/2018   Discussed pneumovax recommendations  Discussed shingrix recommendations  Will discuss with Dr. [name] in lieu of his meds, Lialda   Counseling:   Dental- Every 6 months. Dr Derrek Gu  Male:   [facility]-    05/27/2016. Pt goes to Dr Wallis Mart, states he goes back in 2 years. States he should have had one this year Had polyps and crohns which the later has been very mild Jan 2020 now scheduled after he is off the blood thinners (changed metric to 3 years but followed by GI outside of epic)    PSa usually has this check annually           Objective:     Vitals: BP 138/70   Pulse 63   Ht 5' 9"  (1.753 m)   Wt 203 lb (92.1 kg)   SpO2 94%   BMI 29.98 kg/m   Body mass index is 29.98 kg/m.  Advanced Directives 04/29/2018 07/21/2017 07/16/2016 07/10/2016  Does Patient Have a Medical Advance Directive? No Yes Yes Yes  Type of  Advance Directive - [place];Living will - Living will  Does patient want to make changes to medical advance directive? - No - Patient declined - No - Patient declined  Copy of [place] in Chart? - No - copy requested - -  Would patient like information on creating a medical advance directive? No - Patient declined - - -    Tobacco Social History   Tobacco Use  Smoking Status Former Smoker  . Packs/day: 1.00  . Years: 4.00  . Pack years: 4.00  . Types: Cigarettes  . Last attempt to quit: 12/09/1979  . Years since quitting: 38.6  Smokeless Tobacco Never Used  Tobacco Comment   Quit 38 years ago     Counseling given: Yes Comment: Quit 38 years ago   Clinical Intake:     Past Medical History:  Diagnosis Date  . 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 Crohn's disease ([place])   . Deafness in right ear, wears a microphone   . Decreased peripheral vision 11/04/2011  . Essential tremor   . HLD (hyperlipidemia)   . Inguinal hernia 07/2016  . Inguinal hernia   . Retinal detachment   . Wears hearing aid,  left ear    Past Surgical History:  Procedure Laterality Date  . ACOUSTIC NEUROMA RESECTION Right   . BROW LIFT AND BLEPHAROPLASTY Bilateral 10/11/2012  . CATARACT EXTRACTION W/ INTRAOCULAR LENS  IMPLANT, BILATERAL Bilateral   . HERNIA REPAIR    . INGUINAL HERNIA REPAIR Right 07/16/2016   Procedure: LAPAROSCOPIC RIGHT INGUINAL HERNIA;  Surgeon: Coralie Keens, MD;  Location: [facility];  Service: General;  Laterality: Right;  . INSERTION OF MESH Right 07/16/2016   Procedure: INSERTION OF MESH;  Surgeon: Coralie Keens, MD;  Location: [facility];  Service: General;  Laterality: Right;  . RETINAL DETACHMENT SURGERY Left   . TONSILLECTOMY     Family History  Problem Relation Age of Onset  . Diabetes Mother   . Dementia Mother   . Diabetes Sister    Social History   Socioeconomic History  . Marital status:  Married    Spouse name: Not on file  . Number of children: Not on file  . Years of education: Not on file  . Highest education level: Not on file  Occupational History  . Not on file  Social Needs  . Financial resource strain: Not on file  . Food insecurity:    Worry: Not on file    Inability: Not on file  . Transportation needs:    Medical: Not on file    Non-medical: Not on file  Tobacco Use  . Smoking status: Former Smoker    Packs/day: 1.00    Years: 4.00    Pack years: 4.00    Types: Cigarettes    Last attempt to quit: 12/09/1979    Years since quitting: 38.6  . Smokeless tobacco: Never Used  . Tobacco comment: Quit 38 years ago  Substance and Sexual Activity  . Alcohol use: Yes    Comment: recent ER visit fall after having a few beers  . Drug use: No  . Sexual activity: Not on file  Lifestyle  . Physical activity:    Days per week: Not on file    Minutes per session: Not on file  . Stress: Not on file  Relationships  . Social connections:    Talks on phone: Not on file    Gets together: Not on file    Attends religious service: Not on file    Active member of club or organization: Not on file    Attends meetings of clubs or organizations: Not on file    Relationship status: Not on file  Other Topics Concern  . Not on file  Social History Narrative  . Not on file    Outpatient Encounter Medications as of 07/22/2018  Medication Sig  . cholecalciferol (VITAMIN D) 1000 units tablet Take 5,000 Units by mouth every other day.   Marland Kitchen glucosamine-chondroitin 500-400 MG tablet Take 1 tablet by mouth daily.   . mesalamine (LIALDA) 1.2 g EC tablet Take 2.4 g by mouth daily with breakfast.  . Multiple Vitamin (MULTIVITAMIN) tablet Take 1 tablet by mouth daily.  . Omega-3 Fatty Acids (FISH OIL PO) Take 2,000 mg by mouth daily.   . propranolol (INDERAL) 20 MG tablet Take 20 mg by mouth daily.   . rivaroxaban (XARELTO) 20 MG TABS tablet Take 1 tablet (20 mg total) by mouth  daily with supper.  . Rivaroxaban 15 & 20 MG TBPK Take as directed on package: Start with one 68m tablet by mouth twice a day with food. On Day 22, switch to one  41m tablet once a day with food.  . rosuvastatin (CRESTOR) 10 MG tablet TAKE 1 TABLET (10 MG TOTAL) BY MOUTH DAILY.   No facility-administered encounter medications on file as of 07/22/2018.     Activities of Daily Living No flowsheet data found.  Patient Care Team: PVivi Barrack MD as PCP - General (Family Medicine) BRonald Lobo MD as Consulting Physician (Gastroenterology) GDerrek Guas Consulting Physician (Dentistry) TMarygrace Drought MD as Consulting Physician (Ophthalmology) JMylinda Latinaas Consulting Physician (Ophthalmology)    Assessment:   This is a routine wellness examination for GTorben  Exercise Activities and Dietary recommendations    Goals   None     Fall Risk Fall Risk  07/21/2017 06/01/2017  Falls in the past year? No No     Depression Screen PHQ 2/9 Scores 07/21/2017 06/01/2017  PHQ - 2 Score 0 0     Cognitive Function     Ad8 score reviewed for issues:  Issues making decisions:  Less interest in hobbies / activities:  Repeats questions, stories (family complaining):  Trouble using ordinary gadgets (microwave, computer, phone):  Forgets the month or year:   Mismanaging finances:   Remembering appts:  Daily problems with thinking and/or memory: Ad8 score is=0 Mother [place] in [place]        Immunization History  Administered Date(s) Administered  . Hep A / Hep B 03/03/2013  . Hepatitis A 02/25/2013, 03/03/2013, 03/10/2013, 03/09/2014  . Hepatitis B 02/25/2013, 03/03/2013, 03/10/2013, 03/09/2014  . Influenza-Unspecified 08/22/2010, 09/09/2011, 09/25/2012, 09/18/2014  . Td 06/22/2002  . Tdap 11/12/2009      Screening Tests Health Maintenance  Topic Date Due  . PNA vac Low Risk Adult (1 of 2 - PCV13) 07/15/2015  . INFLUENZA VACCINE  07/08/2018  .  COLONOSCOPY  05/28/2019  . TETANUS/TDAP  11/13/2019  . Hepatitis C Screening  Completed         Plan:      PCP Notes   Health Maintenance Discussed pneumovax recommendations  Discussed shingrix recommendations  Will discuss with Dr. PJerline Painin lieu of his meds, Lialda   AAA ordered due to smoking hx  Counseling:   Dental- Every 6 months. Dr GDerrek Gu Male:   [facility]-    05/27/2016. Pt goes to Dr BWallis Mart states he goes back in 2 years. States he should have had one this year Had polyps and crohns which the later has been very mild Jan 2020 now scheduled after he is off the blood thinners (changed metric to 3 years but followed by GI outside of epic)    Abnormal Screens  EtOH discussed  with recent ER admit Discussed at length. The patient was taken back by this episode as this is not his norm.  Has considered  whether he should stop drinking all together.  Discussed Alcoholism can become an issue after retirement and to recognize red flags along the way as consequences which start to mount up. Agreed he may consider not drinking as he was a little startled over this incident  If craving exist at any juncture, this would place him at higher risk.  Generally causal use of ETOH  Referrals  none  Patient concerns; Rivaroxaban down to 15 mg daily  Per Mychart has to take this through 07/30/2018 This is noted in Dr. PJerline Painnote  Will discuss vaccinations In lieu of [place] Nurse Concerns; As noted  Next PCP apt       I have personally  reviewed and noted the following in the patient's chart:   . Medical and social history . Use of alcohol, tobacco or illicit drugs  . Current medications and supplements . Functional ability and status . Nutritional status . Physical activity . Advanced directives . List of other physicians . Hospitalizations, surgeries, and ER visits in previous 12 months . Vitals . Screenings to include cognitive, depression, and  falls . Referrals and appointments  In addition, I have reviewed and discussed with patient certain preventive protocols, quality metrics, and best practice recommendations. A written personalized care plan for preventive services as well as general preventive health recommendations were provided to patient.     Wynetta Fines, RN  07/22/2018

## 2018-07-22 ENCOUNTER — Ambulatory Visit (INDEPENDENT_AMBULATORY_CARE_PROVIDER_SITE_OTHER): Payer: PPO

## 2018-07-22 VITALS — BP 138/70 | HR 63 | Ht 69.0 in | Wt 203.0 lb

## 2018-07-22 DIAGNOSIS — Z87891 Personal history of nicotine dependence: Secondary | ICD-10-CM | POA: Diagnosis not present

## 2018-07-22 DIAGNOSIS — Z0000 Encounter for general adult medical examination without abnormal findings: Secondary | ICD-10-CM | POA: Diagnosis not present

## 2018-07-22 NOTE — Patient Instructions (Addendum)
Shaun Armstrong , Thank you for taking time to come for your Medicare Wellness Visit. I appreciate your ongoing commitment to your health goals. Please review the following plan we discussed and let me know if I can assist you in the future.   Can discuss vaccinations with Dr. [name] due to taking Lialda   Will order abd aortic aneurysm check and someone will call for an apt  The Centers for Disease Control are now recommending 2 pneumonia vaccinations after 105. The first is the Prevnar 13. This helps to boost your immunity to community acquired pneumonia as well as some protection from bacterial pneumonia  The 2nd is the pneumovax 23, which offers more broad protection!  Please consider taking these as this is your best protection against pneumonia.  Shingrix is a vaccine for the prevention of Shingles in Adults 50 and older.  If you are on Medicare, the shingrix is covered under your Part D plan, so you will take both of the vaccines in the series at your pharmacy. Please check with your benefits regarding applicable copays or out of pocket expenses.  The Shingrix is given in 2 vaccines approx 8 weeks apart. You must receive the 2nd dose prior to 6 months from receipt of the first. Please have the pharmacist print out you Immunization  dates for our office records      These are the goals we discussed: Goals   None     This is a list of the screening recommended for you and due dates:  Health Maintenance  Topic Date Due  . Pneumonia vaccines (1 of 2 - PCV13) 07/15/2015  . Flu Shot  07/08/2018  . Colon Cancer Screening  05/28/2019  . Tetanus Vaccine  11/13/2019  .  Hepatitis C: One time screening is recommended by Center for Disease Control  (CDC) for  adults born from [birth year] through 1965.   Completed      Fall Prevention in the Home Falls can cause injuries. They can happen to people of all ages. There are many things you can do to make your home safe and to help prevent  falls. What can I do on the outside of my home?  Regularly fix the edges of walkways and driveways and fix any cracks.  Remove anything that might make you trip as you walk through a door, such as a raised step or threshold.  Trim any bushes or trees on the path to your home.  Use bright outdoor lighting.  Clear any walking paths of anything that might make someone trip, such as rocks or tools.  Regularly check to see if handrails are loose or broken. Make sure that both sides of any steps have handrails.  Any raised decks and porches should have guardrails on the edges.  Have any leaves, snow, or ice cleared regularly.  Use sand or salt on walking paths during winter.  Clean up any spills in your garage right away. This includes oil or grease spills. What can I do in the bathroom?  Use night lights.  Install grab bars by the toilet and in the tub and shower. Do not use towel bars as grab bars.  Use non-skid mats or decals in the tub or shower.  If you need to sit down in the shower, use a plastic, non-slip stool.  Keep the floor dry. Clean up any water that spills on the floor as soon as it happens.  Remove soap buildup in the tub or shower regularly.  Attach bath mats securely with double-sided non-slip rug tape.  Do not have throw rugs and other things on the floor that can make you trip. What can I do in the bedroom?  Use night lights.  Make sure that you have a light by your bed that is easy to reach.  Do not use any sheets or blankets that are too big for your bed. They should not hang down onto the floor.  Have a firm chair that has side arms. You can use this for support while you get dressed.  Do not have throw rugs and other things on the floor that can make you trip. What can I do in the kitchen?  Clean up any spills right away.  Avoid walking on wet floors.  Keep items that you use a lot in easy-to-reach places.  If you need to reach something  above you, use a strong step stool that has a grab bar.  Keep electrical cords out of the way.  Do not use floor polish or wax that makes floors slippery. If you must use wax, use non-skid floor wax.  Do not have throw rugs and other things on the floor that can make you trip. What can I do with my stairs?  Do not leave any items on the stairs.  Make sure that there are handrails on both sides of the stairs and use them. Fix handrails that are broken or loose. Make sure that handrails are as long as the stairways.  Check any carpeting to make sure that it is firmly attached to the stairs. Fix any carpet that is loose or worn.  Avoid having throw rugs at the top or bottom of the stairs. If you do have throw rugs, attach them to the floor with carpet tape.  Make sure that you have a light switch at the top of the stairs and the bottom of the stairs. If you do not have them, ask someone to add them for you. What else can I do to help prevent falls?  Wear shoes that: ? Do not have high heels. ? Have rubber bottoms. ? Are comfortable and fit you well. ? Are closed at the toe. Do not wear sandals.  If you use a stepladder: ? Make sure that it is fully opened. Do not climb a closed stepladder. ? Make sure that both sides of the stepladder are locked into place. ? Ask someone to hold it for you, if possible.  Clearly mark and make sure that you can see: ? Any grab bars or handrails. ? First and last steps. ? Where the edge of each step is.  Use tools that help you move around (mobility aids) if they are needed. These include: ? Canes. ? Walkers. ? Scooters. ? Crutches.  Turn on the lights when you go into a dark area. Replace any light bulbs as soon as they burn out.  Set up your furniture so you have a clear path. Avoid moving your furniture around.  If any of your floors are uneven, fix them.  If there are any pets around you, be aware of where they are.  Review your  medicines with your doctor. Some medicines can make you feel dizzy. This can increase your chance of falling. Ask your doctor what other things that you can do to help prevent falls. This information is not intended to replace advice given to you by your health care provider. Make sure you discuss any questions you have  with your health care provider. Document Released: 09/20/2009 Document Revised: 05/01/2016 Document Reviewed: 12/29/2014 Elsevier Interactive Patient Education  2018 [place] Maintenance, Male A healthy lifestyle and preventive care is important for your health and wellness. Ask your health care provider about what schedule of regular examinations is right for you. What should I know about weight and diet? Eat a Healthy Diet  Eat plenty of vegetables, fruits, whole grains, low-fat dairy products, and lean protein.  Do not eat a lot of foods high in solid fats, added sugars, or salt.  Maintain a Healthy Weight Regular exercise can help you achieve or maintain a healthy weight. You should:  Do at least 150 minutes of exercise each week. The exercise should increase your heart rate and make you sweat (moderate-intensity exercise).  Do strength-training exercises at least twice a week.  Watch Your Levels of Cholesterol and Blood Lipids  Have your blood tested for lipids and cholesterol every 5 years starting at 68 years of age. If you are at high risk for heart disease, you should start having your blood tested when you are 68 years old. You may need to have your cholesterol levels checked more often if: ? Your lipid or cholesterol levels are high. ? You are older than 68 years of age. ? You are at high risk for heart disease.  What should I know about cancer screening? Many types of cancers can be detected early and may often be prevented. Lung Cancer  You should be screened every year for lung cancer if: ? You are a current smoker who has smoked for at  least 30 years. ? You are a former smoker who has quit within the past 15 years.  Talk to your health care provider about your screening options, when you should start screening, and how often you should be screened.  Colorectal Cancer  Routine colorectal cancer screening usually begins at 67 years of age and should be repeated every 5-10 years until you are 68 years old. You may need to be screened more often if early forms of precancerous polyps or small growths are found. Your health care provider may recommend screening at an earlier age if you have risk factors for colon cancer.  Your health care provider may recommend using home test kits to check for hidden blood in the stool.  A small camera at the end of a tube can be used to examine your colon (sigmoidoscopy or colonoscopy). This checks for the earliest forms of colorectal cancer.  Prostate and Testicular Cancer  Depending on your age and overall health, your health care provider may do certain tests to screen for prostate and testicular cancer.  Talk to your health care provider about any symptoms or concerns you have about testicular or prostate cancer.  Skin Cancer  Check your skin from head to toe regularly.  Tell your health care provider about any new moles or changes in moles, especially if: ? There is a change in a mole's size, shape, or color. ? You have a mole that is larger than a pencil eraser.  Always use sunscreen. Apply sunscreen liberally and repeat throughout the day.  Protect yourself by wearing long sleeves, pants, a wide-brimmed hat, and sunglasses when outside.  What should I know about heart disease, diabetes, and high blood pressure?  If you are 35-5 years of age, have your blood pressure checked every 3-5 years. If you are 41 years of age or older, have your  blood pressure checked every year. You should have your blood pressure measured twice-once when you are at a hospital or clinic, and once when  you are not at a hospital or clinic. Record the average of the two measurements. To check your blood pressure when you are not at a hospital or clinic, you can use: ? An automated blood pressure machine at a pharmacy. ? A home blood pressure monitor.  Talk to your health care provider about your target blood pressure.  If you are between 46-61 years old, ask your health care provider if you should take aspirin to prevent heart disease.  Have regular diabetes screenings by checking your fasting blood sugar level. ? If you are at a normal weight and have a low risk for diabetes, have this test once every three years after the age of 68. ? If you are overweight and have a high risk for diabetes, consider being tested at a younger age or more often.  A one-time screening for abdominal aortic aneurysm (AAA) by ultrasound is recommended for men aged 71-75 years who are current or former smokers. What should I know about preventing infection? Hepatitis B If you have a higher risk for hepatitis B, you should be screened for this virus. Talk with your health care provider to find out if you are at risk for hepatitis B infection. Hepatitis C Blood testing is recommended for:  Everyone born from [birth year] through 1965.  Anyone with known risk factors for hepatitis C.  Sexually Transmitted Diseases (STDs)  You should be screened each year for STDs including gonorrhea and chlamydia if: ? You are sexually active and are younger than 68 years of age. ? You are older than 68 years of age and your health care provider tells you that you are at risk for this type of infection. ? Your sexual activity has changed since you were last screened and you are at an increased risk for chlamydia or gonorrhea. Ask your health care provider if you are at risk.  Talk with your health care provider about whether you are at high risk of being infected with HIV. Your health care provider may recommend a prescription medicine  to help prevent HIV infection.  What else can I do?  Schedule regular health, dental, and eye exams.  Stay current with your vaccines (immunizations).  Do not use any tobacco products, such as cigarettes, chewing tobacco, and e-cigarettes. If you need help quitting, ask your health care provider.  Limit alcohol intake to no more than 2 drinks per day. One drink equals 12 ounces of beer, 5 ounces of wine, or 1 ounces of hard liquor.  Do not use street drugs.  Do not share needles.  Ask your health care provider for help if you need support or information about quitting drugs.  Tell your health care provider if you often feel depressed.  Tell your health care provider if you have ever been abused or do not feel safe at home. This information is not intended to replace advice given to you by your health care provider. Make sure you discuss any questions you have with your health care provider. Document Released: 05/22/2008 Document Revised: 07/23/2016 Document Reviewed: 08/28/2015 Elsevier Interactive Patient Education  [employer].

## 2018-07-22 NOTE — Progress Notes (Signed)
I have personally reviewed the Medicare Annual Wellness Visit and agree with the assessment and plan.  Algis Greenhouse. [name], MD 07/22/2018 1:23 PM

## 2018-07-27 ENCOUNTER — Encounter: Payer: Self-pay | Admitting: Family Medicine

## 2018-08-08 DIAGNOSIS — I82A21 Chronic embolism and thrombosis of right axillary vein: Secondary | ICD-10-CM | POA: Insufficient documentation

## 2018-08-13 ENCOUNTER — Other Ambulatory Visit: Payer: Self-pay | Admitting: Family Medicine

## 2018-08-13 DIAGNOSIS — E785 Hyperlipidemia, unspecified: Secondary | ICD-10-CM

## 2018-08-13 MED FILL — MESALAMINE 1.2 GM TBEC: 1.2 | 30 days supply | Qty: 60 | Fill #10

## 2018-08-16 IMAGING — CT CT CERVICAL SPINE W/O CM
4 of 7 series · 14 of 33 positions shown, 15 images · non-contrast
Comparison: None.

CLINICAL DATA: Several falls with LOC

EXAM:
CT HEAD WITHOUT CONTRAST
CT CERVICAL SPINE WITHOUT CONTRAST
TECHNIQUE: Multidetector CT imaging of the head and cervical spine was
performed following the standard protocol without intravenous
contrast. Multiplanar CT image reconstructions of the cervical spine
were also generated.

[Series 9: c_spine 2.0 st · axial · 0.33mm/px · z∈[-235,-121]mm · 4 of 97 slices shown, 5 images]
[im 20/97  soft-tissue]
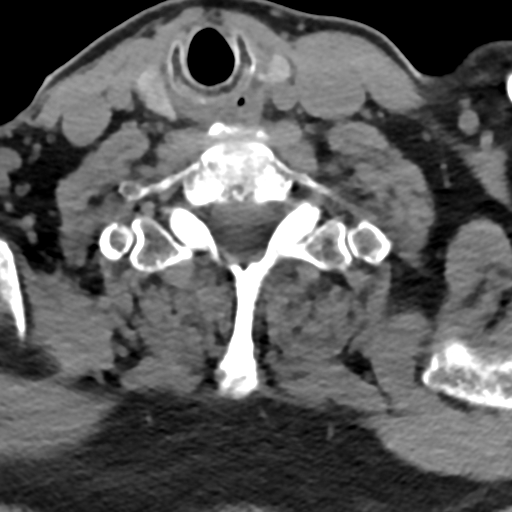
[im 20/97  bone]
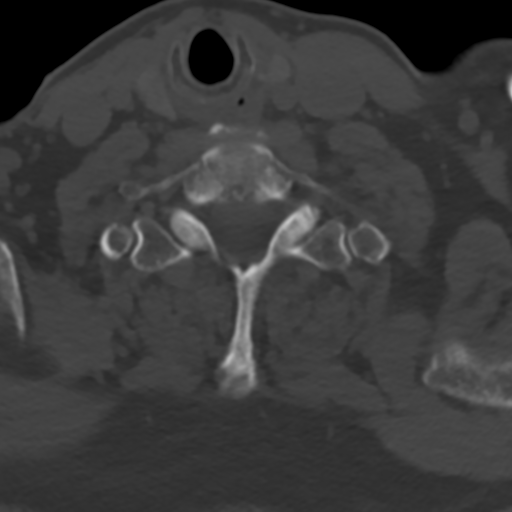
[im 39/97  bone]
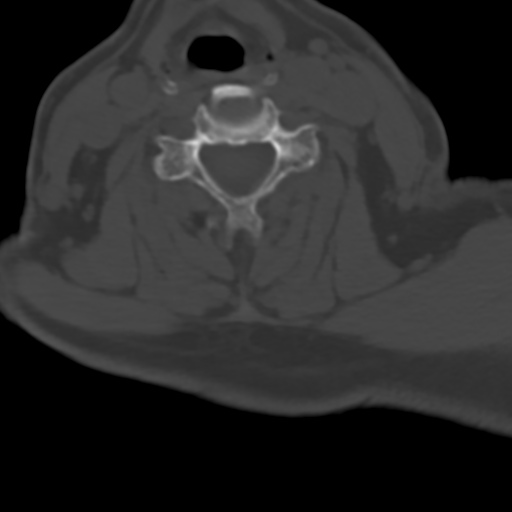
[im 58/97  bone]
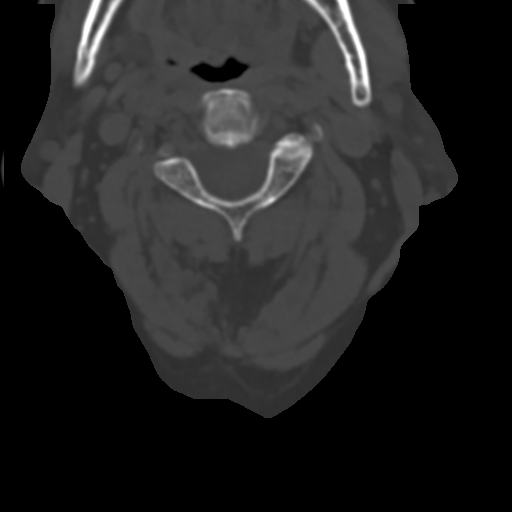
[im 77/97  bone]
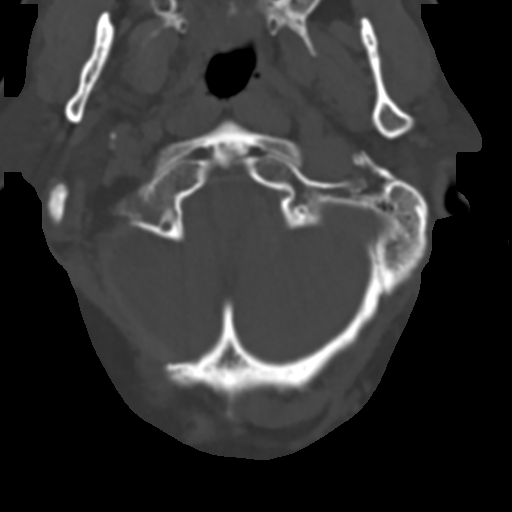

[Series 10: coronal bone · coronal · 0.25mm/px · 1 of 61 slices shown]
[im 31/61  bone]
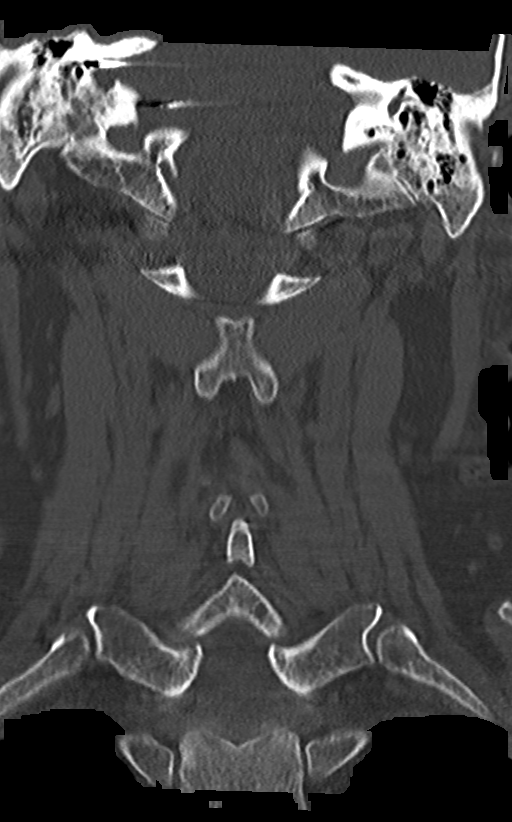

[Series 11: sagittal bone · sagittal · 0.28mm/px · 5 of 61 slices shown]
[im 11/61  bone]
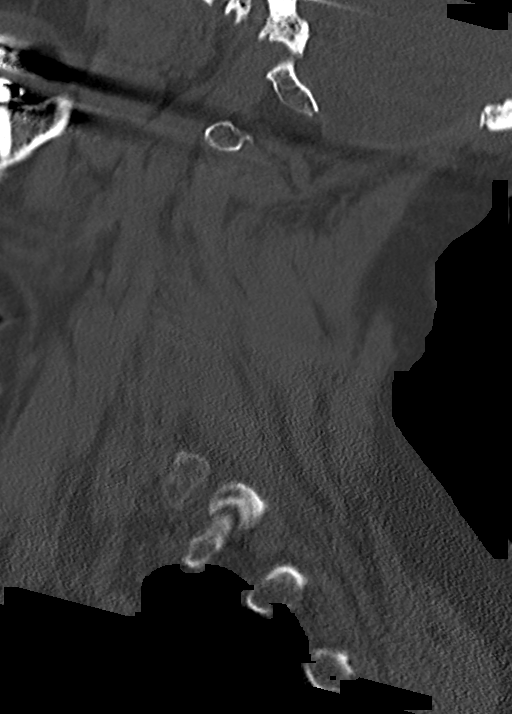
[im 21/61  bone]
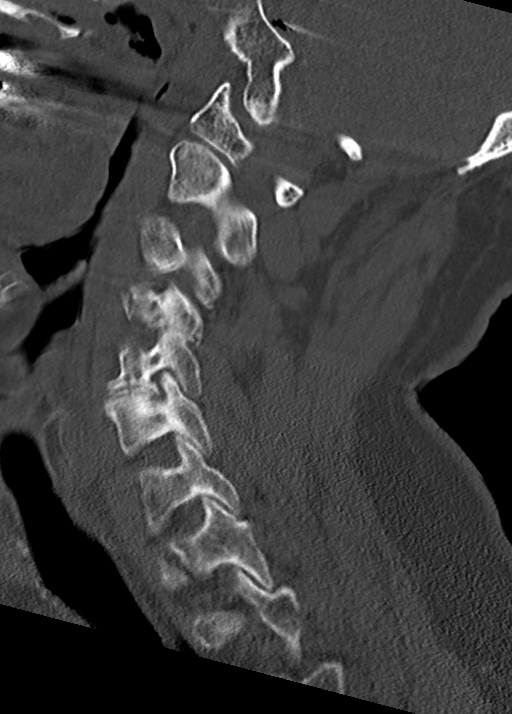
[im 31/61  bone]
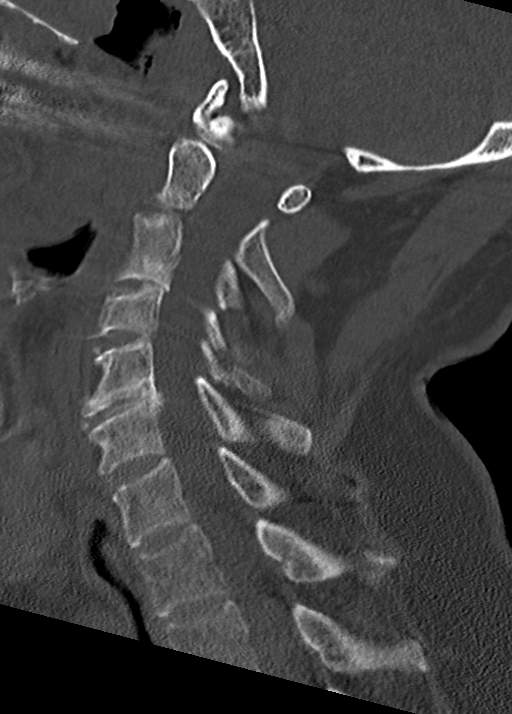
[im 41/61  bone]
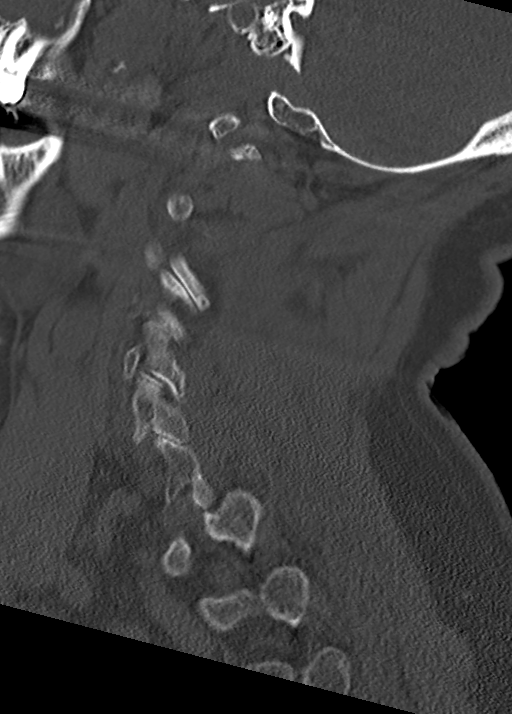
[im 51/61  bone]
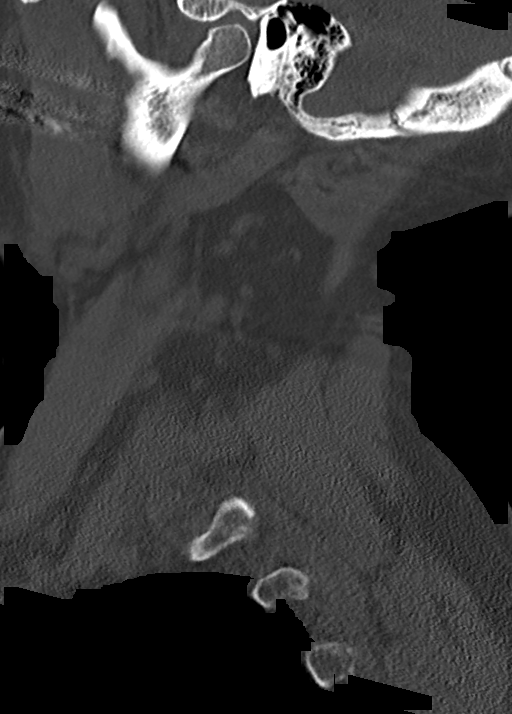

[Series 13: orthogonal axial st · axial · 0.21mm/px · z∈[-264,-152]mm · 4 of 93 slices shown]
[im 19/93  bone]
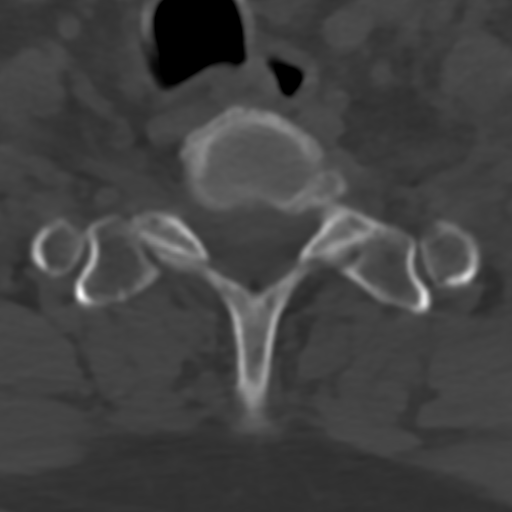
[im 37/93  bone]
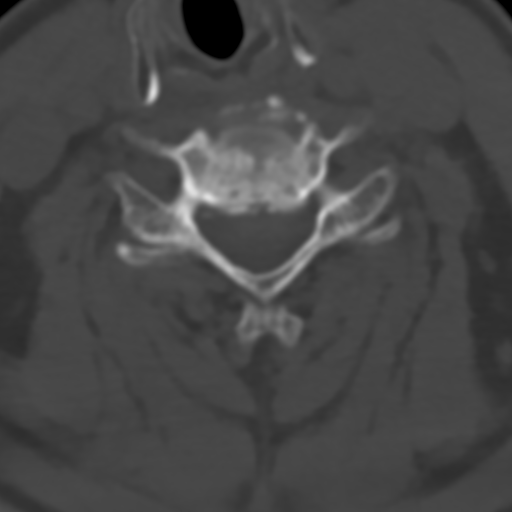
[im 56/93  bone]
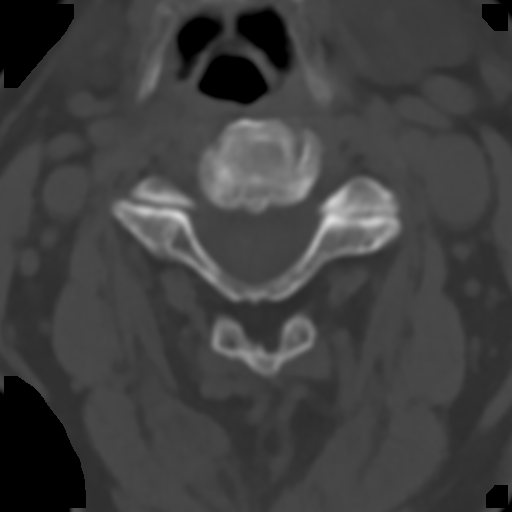
[im 74/93  bone]
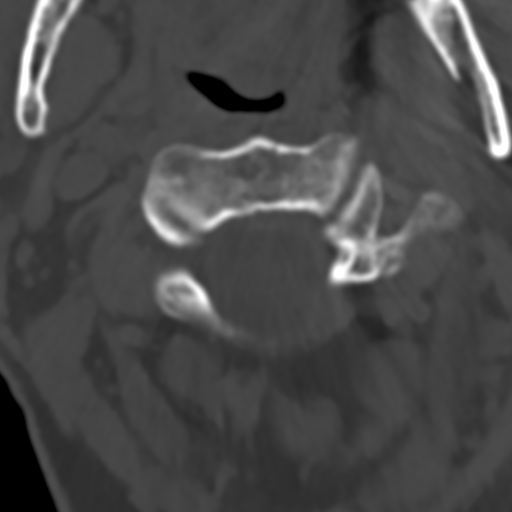

[14 of 33 positions shown; findings below may reference images not displayed]

FINDINGS: CT HEAD FINDINGS

Brain: Motion degraded study. No gross acute territorial infarction
hemorrhage or mass. Mild encephalomalacia in the right cerebellum.
Ventricle size within normal limits. Mild atrophy.

Vascular: No hyperdense vessels.  No unexpected calcification

Skull: Right suboccipital craniectomy.  No gross fracture

Sinuses/Orbits: Opacified left maxillary sinus with moderate mucosal
thickening in the right maxillary sinus.

Other: None

CT CERVICAL SPINE FINDINGS

Alignment: Motion degradation. No subluxation. Facet alignment
within normal limits

Skull base and vertebrae: No acute fracture. No primary bone lesion
or focal pathologic process.

Soft tissues and spinal canal: No prevertebral fluid or swelling. No
visible canal hematoma.

Disc levels:  Degenerative changes at C5-C6.

Upper chest: Negative.

Other: None
IMPRESSION: 1. Motion degraded CT of the brain. No gross acute intracranial
abnormality allowing for patient motion. Prior right suboccipital
craniectomy with encephalomalacia in the right cerebellar hemisphere
2. Motion degraded CT of the cervical spine limits evaluation for
fracture. No gross acute osseous abnormality is seen.

## 2018-08-16 MED FILL — ROSUVASTATIN CALCIUM 10 MG: 10 | 30 days supply | Qty: 30 | Fill #0

## 2018-08-16 NOTE — Telephone Encounter (Signed)
Shaun Armstrong patient.

## 2018-08-17 ENCOUNTER — Other Ambulatory Visit: Payer: Self-pay | Admitting: Family Medicine

## 2018-08-17 NOTE — Telephone Encounter (Signed)
[facility] patient please advise.

## 2018-08-20 ENCOUNTER — Other Ambulatory Visit: Payer: Self-pay

## 2018-08-20 ENCOUNTER — Encounter: Payer: Self-pay | Admitting: Family Medicine

## 2018-08-20 MED ORDER — PROPRANOLOL HCL 20 MG PO TABS: 20.0000 mg | ORAL_TABLET | Freq: Every day | ORAL | 3 refills | Status: DC

## 2018-08-20 MED FILL — PROPRANOLOL 20 MG TABLET: 20 | 90 days supply | Qty: 90 | Fill #0

## 2018-09-09 DIAGNOSIS — L820 Inflamed seborrheic keratosis: Secondary | ICD-10-CM | POA: Diagnosis not present

## 2018-09-09 DIAGNOSIS — L814 Other melanin hyperpigmentation: Secondary | ICD-10-CM | POA: Diagnosis not present

## 2018-09-09 DIAGNOSIS — L570 Actinic keratosis: Secondary | ICD-10-CM | POA: Diagnosis not present

## 2018-09-09 DIAGNOSIS — D229 Melanocytic nevi, unspecified: Secondary | ICD-10-CM | POA: Diagnosis not present

## 2018-09-09 DIAGNOSIS — D1801 Hemangioma of skin and subcutaneous tissue: Secondary | ICD-10-CM | POA: Diagnosis not present

## 2018-09-09 DIAGNOSIS — L819 Disorder of pigmentation, unspecified: Secondary | ICD-10-CM | POA: Diagnosis not present

## 2018-09-09 DIAGNOSIS — D485 Neoplasm of uncertain behavior of skin: Secondary | ICD-10-CM | POA: Diagnosis not present

## 2018-09-09 DIAGNOSIS — L821 Other seborrheic keratosis: Secondary | ICD-10-CM | POA: Diagnosis not present

## 2018-09-09 MED FILL — ROSUVASTATIN CALCIUM 10 MG: 10 | 30 days supply | Qty: 30 | Fill #1

## 2018-09-10 MED FILL — MESALAMINE 1.2 GM TBEC: 1.2 | 30 days supply | Qty: 60 | Fill #11

## 2018-09-15 ENCOUNTER — Encounter: Payer: Self-pay | Admitting: Family Medicine

## 2018-10-11 ENCOUNTER — Encounter: Payer: PPO | Admitting: Family Medicine

## 2018-10-13 MED FILL — ROSUVASTATIN CALCIUM 10 MG: 10 | 30 days supply | Qty: 30 | Fill #2

## 2018-10-13 MED FILL — MESALAMINE DR 1.2 GM TABLET: 1.2 | 60 days supply | Qty: 120 | Fill #0

## 2018-10-13 MED FILL — PEG-3350 SOLUTION: 420 | 1 days supply | Qty: 4000 | Fill #0

## 2018-10-17 ENCOUNTER — Encounter: Payer: Self-pay | Admitting: Family Medicine

## 2018-10-25 ENCOUNTER — Ambulatory Visit (INDEPENDENT_AMBULATORY_CARE_PROVIDER_SITE_OTHER): Payer: PPO | Admitting: Family Medicine

## 2018-10-25 ENCOUNTER — Encounter: Payer: Self-pay | Admitting: Family Medicine

## 2018-10-25 VITALS — BP 134/68 | HR 55 | Temp 98.0°F | Ht 69.0 in | Wt 199.6 lb

## 2018-10-25 DIAGNOSIS — G250 Essential tremor: Secondary | ICD-10-CM | POA: Diagnosis not present

## 2018-10-25 DIAGNOSIS — K5080 Crohn's disease of both small and large intestine without complications: Secondary | ICD-10-CM

## 2018-10-25 DIAGNOSIS — G5711 Meralgia paresthetica, right lower limb: Secondary | ICD-10-CM | POA: Diagnosis not present

## 2018-10-25 DIAGNOSIS — K219 Gastro-esophageal reflux disease without esophagitis: Secondary | ICD-10-CM

## 2018-10-25 DIAGNOSIS — E785 Hyperlipidemia, unspecified: Secondary | ICD-10-CM

## 2018-10-25 DIAGNOSIS — L989 Disorder of the skin and subcutaneous tissue, unspecified: Secondary | ICD-10-CM

## 2018-10-25 DIAGNOSIS — Z0001 Encounter for general adult medical examination with abnormal findings: Secondary | ICD-10-CM | POA: Diagnosis not present

## 2018-10-25 DIAGNOSIS — Z125 Encounter for screening for malignant neoplasm of prostate: Secondary | ICD-10-CM

## 2018-10-25 DIAGNOSIS — R739 Hyperglycemia, unspecified: Secondary | ICD-10-CM

## 2018-10-25 DIAGNOSIS — J3489 Other specified disorders of nose and nasal sinuses: Secondary | ICD-10-CM | POA: Diagnosis not present

## 2018-10-25 DIAGNOSIS — M542 Cervicalgia: Secondary | ICD-10-CM | POA: Diagnosis not present

## 2018-10-25 HISTORY — DX: Gastro-esophageal reflux disease without esophagitis: K21.9

## 2018-10-25 HISTORY — DX: Meralgia paresthetica, right lower limb: G57.11

## 2018-10-25 LAB — COMPREHENSIVE METABOLIC PANEL
ALT: 35 U/L (ref 0–53)
AST: 38 U/L — ABNORMAL HIGH (ref 0–37)
Albumin: 4.7 g/dL (ref 3.5–5.2)
Alkaline Phosphatase: 82 U/L (ref 39–117)
BUN: 18 mg/dL (ref 6–23)
CO2: 30 mEq/L (ref 19–32)
Calcium: 12.1 mg/dL — ABNORMAL HIGH (ref 8.4–10.5)
Chloride: 102 mEq/L (ref 96–112)
Creatinine, Ser: 0.94 mg/dL (ref 0.40–1.50)
GFR: 84.75 mL/min (ref >60.00)
Glucose, Bld: 83 mg/dL (ref 70–99)
Potassium: 4.9 mEq/L (ref 3.5–5.1)
Sodium: 142 mEq/L (ref 135–145)
Total Bilirubin: 0.8 mg/dL (ref 0.2–1.2)
Total Protein: 7 g/dL (ref 6.0–8.3)

## 2018-10-25 LAB — CBC
HCT: 48.4 % (ref 39.0–52.0)
Hemoglobin: 16.1 g/dL (ref 13.0–17.0)
MCHC: 33.2 g/dL (ref 30.0–36.0)
MCV: 90 fl (ref 78.0–100.0)
Platelets: 237 10*3/uL (ref 150.0–400.0)
RBC: 5.38 Mil/uL (ref 4.22–5.81)
RDW: 13.6 % (ref 11.5–15.5)
WBC: 5.9 10*3/uL (ref 4.0–10.5)

## 2018-10-25 LAB — PSA, MEDICARE: PSA: 1.95 ng/ml (ref 0.10–4.00)

## 2018-10-25 LAB — HEMOGLOBIN A1C: Hgb A1c MFr Bld: 5.5 % (ref 4.6–6.5)

## 2018-10-25 NOTE — Patient Instructions (Signed)
It was very nice to see you today!  Keep up with blood work! You have a condition called meralgia paresthetica.  This is due to nerve irritation on your hip.  This is benign.  Please try wearing looser fitting belts.  Your neck pain is due to mild muscle irritation.  This is not a sign of anything more serious.  We froze a spot on your face.  Please let me know if it does not improve.  Please let me know if you like to change medications for your tremor.  We will check blood work today.  Come back to see me in 1 year for your next physical, or sooner as needed  Take care, Dr [name]   Preventive Care 68 Years and Older, Male Preventive care refers to lifestyle choices and visits with your health care provider that can promote health and wellness. What does preventive care include?  A yearly physical exam. This is also called an annual well check.  Dental exams once or twice a year.  Routine eye exams. Ask your health care provider how often you should have your eyes checked.  Personal lifestyle choices, including: ? Daily care of your teeth and gums. ? Regular physical activity. ? Eating a healthy diet. ? Avoiding tobacco and drug use. ? Limiting alcohol use. ? Practicing safe sex. ? Taking low doses of aspirin every day. ? Taking vitamin and mineral supplements as recommended by your health care provider. What happens during an annual well check? The services and screenings done by your health care provider during your annual well check will depend on your age, overall health, lifestyle risk factors, and family history of disease. Counseling Your health care provider may ask you questions about your:  Alcohol use.  Tobacco use.  Drug use.  Emotional well-being.  Home and relationship well-being.  Sexual activity.  Eating habits.  History of falls.  Memory and ability to understand (cognition).  Work and work Statistician.  Screening You may have the  following tests or measurements:  Height, weight, and BMI.  Blood pressure.  Lipid and cholesterol levels. These may be checked every 5 years, or more frequently if you are over 67 years old.  Skin check.  Lung cancer screening. You may have this screening every year starting at age 21 if you have a 30-pack-year history of smoking and currently smoke or have quit within the past 15 years.  Fecal occult blood test (FOBT) of the stool. You may have this test every year starting at age 40.  Flexible sigmoidoscopy or colonoscopy. You may have a sigmoidoscopy every 5 years or a colonoscopy every 10 years starting at age 47.  Prostate cancer screening. Recommendations will vary depending on your family history and other risks.  Hepatitis C blood test.  Hepatitis B blood test.  Sexually transmitted disease (STD) testing.  Diabetes screening. This is done by checking your blood sugar (glucose) after you have not eaten for a while (fasting). You may have this done every 1-3 years.  Abdominal aortic aneurysm (AAA) screening. You may need this if you are a current or former smoker.  Osteoporosis. You may be screened starting at age 33 if you are at high risk.  Talk with your health care provider about your test results, treatment options, and if necessary, the need for more tests. Vaccines Your health care provider may recommend certain vaccines, such as:  Influenza vaccine. This is recommended every year.  Tetanus, diphtheria, and acellular pertussis (Tdap,  Td) vaccine. You may need a Td booster every 10 years.  Varicella vaccine. You may need this if you have not been vaccinated.  Zoster vaccine. You may need this after age 36.  Measles, mumps, and rubella (MMR) vaccine. You may need at least one dose of MMR if you were born in 1957 or later. You may also need a second dose.  Pneumococcal 13-valent conjugate (PCV13) vaccine. One dose is recommended after age 7.  Pneumococcal  polysaccharide (PPSV23) vaccine. One dose is recommended after age 35.  Meningococcal vaccine. You may need this if you have certain conditions.  Hepatitis A vaccine. You may need this if you have certain conditions or if you travel or work in places where you may be exposed to hepatitis A.  Hepatitis B vaccine. You may need this if you have certain conditions or if you travel or work in places where you may be exposed to hepatitis B.  Haemophilus influenzae type b (Hib) vaccine. You may need this if you have certain risk factors.  Talk to your health care provider about which screenings and vaccines you need and how often you need them. This information is not intended to replace advice given to you by your health care provider. Make sure you discuss any questions you have with your health care provider. Document Released: 12/21/2015 Document Revised: 08/13/2016 Document Reviewed: 09/25/2015 Elsevier Interactive Patient Education  [employer].

## 2018-10-25 NOTE — Assessment & Plan Note (Signed)
Check lipid panel.  Continue Crestor 10 mg daily.

## 2018-10-25 NOTE — Progress Notes (Signed)
Subjective:  Shaun Armstrong is a 67 y.o. male who presents today for his annual comprehensive physical exam.    HPI:  He has no acute complaints today.   He has a few minor complaints outlined below:  1.  Right thigh tingling.  Started a few weeks ago.  Occurs a few times a week.  Last for an hour and then subsides.  No obvious precipitating events.  No other weakness or numbness. 2.  Stomach discomfort.  Started 10 to 20 years ago.  Worsened after eating certain foods.  Takes antacids which helps.  No early satiety.  No dysphasia. 3.  Neck pain.  Started a few weeks ago.  Located on right side of his neck.  No weakness or numbness.  No specific treatment.  Occasionally feels stiff. 4.  Facial lesion.  Started about a year ago.  Located on left cheek.  Has become more red lately.  His chronic medical conditions are outlined below:  1. Essential Tremor. Currently on propranolol 64m 2. HLD. On crestor 169mdaily  Lifestyle Diet: Follows the "Little Black Dress" diet.  Exercise: Works out a few times per week. Goes to gym.   [facility] 07/22/2018  Decreased Interest 0  Down, Depressed, Hopeless 0  PHQ - 2 Score 0   ROS: Per HPI, otherwise a complete review of systems was negative.   PMH:  The following were reviewed and entered/updated in epic: Past Medical History:  Diagnosis Date  . 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 Cro[place]'s disease ([place]  . Deafness in right ear, wears a microphone   . Decreased peripheral vision 11/04/2011  . Essential tremor   . HLD (hyperlipidemia)   . Inguinal hernia 07/2016  . Inguinal hernia   . Retinal detachment   . Wears hearing aid, left ear    Patient Active Problem List   Diagnosis Date Noted  . Meralgia paresthetica of right side 10/25/2018  . Gastroesophageal reflux disease without esophagitis 10/25/2018  . Sun-damaged skin 08/03/2017  . Essential tremor 06/05/2016  . 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 Crohn's disease of  both small and large intestine without complication ([facility]0350. Hyperlipidemia 10/05/2012  . Decreased peripheral vision 11/04/2011   Past Surgical History:  Procedure Laterality Date  . ACOUSTIC NEUROMA RESECTION Right   . BROW LIFT AND BLEPHAROPLASTY Bilateral 10/11/2012  . CATARACT EXTRACTION W/ INTRAOCULAR LENS  IMPLANT, BILATERAL Bilateral   . HERNIA REPAIR    . INGUINAL HERNIA REPAIR Right 07/16/2016   Procedure: LAPAROSCOPIC RIGHT INGUINAL HERNIA;  Surgeon: DoCoralie KeensMD;  [place];  Laterality: Right;  . INSERTION OF MESH Right 07/16/2016   Procedure: INSERTION OF MESH;  Surgeon: DoCoralie KeensMD;  [place] Service: General;  Laterality: Right;  . RETINAL DETACHMENT SURGERY Left   . TONSILLECTOMY      Family History  Problem Relation Age of Onset  . Diabetes Mother   . Dementia Mother   . Diabetes Sister     Medications- reviewed and updated Current Outpatient Medications  Medication Sig Dispense Refill  . cholecalciferol (VITAMIN D) 1000 units tablet Take 5,000 Units by mouth every other day.     . Marland Kitchenlucosamine-chondroitin 500-400 MG tablet Take 1 tablet by mouth daily.     . mesalamine (LIALDA) 1.2 g EC tablet Take 2.4 g by mouth daily with breakfast.    . Multiple Vitamin (MULTIVITAMIN) tablet Take  1 tablet by mouth daily.    . Omega-3 Fatty Acids (FISH OIL PO) Take 2,000 mg by mouth daily.     . propranolol (INDERAL) 20 MG tablet Take 1 tablet (20 mg total) by mouth daily. 90 tablet 3  . rosuvastatin (CRESTOR) 10 MG tablet TAKE 1 TABLET (10 MG TOTAL) BY MOUTH DAILY. 30 tablet 5   No current facility-administered medications for this visit.     Allergies-reviewed and updated Allergies  Allergen Reactions  . Other Itching    OCTOPUS  . Ceftin [Cefuroxime Axetil] Other (See Comments)    Social History   Socioeconomic History  . Marital status: Married    Spouse name:  Not on file  . Number of children: Not on file  . Years of education: Not on file  . Highest education level: Not on file  Occupational History  . Not on file  Social Needs  . Financial resource strain: Not on file  . Food insecurity:    Worry: Not on file    Inability: Not on file  . Transportation needs:    Medical: Not on file    Non-medical: Not on file  Tobacco Use  . Smoking status: Former Smoker    Packs/day: 1.00    Years: 4.00    Pack years: 4.00    Types: Cigarettes    Last attempt to quit: 12/09/1979    Years since quitting: 38.9  . Smokeless tobacco: Never Used  . Tobacco comment: Quit 38 years ago  Substance and Sexual Activity  . Alcohol use: Yes    Comment: recent ER visit fall after having a few beers  . Drug use: No  . Sexual activity: Not on file  Lifestyle  . Physical activity:    Days per week: Not on file    Minutes per session: Not on file  . Stress: Not on file  Relationships  . Social connections:    Talks on phone: Not on file    Gets together: Not on file    Attends religious service: Not on file    Active member of club or organization: Not on file    Attends meetings of clubs or organizations: Not on file    Relationship status: Not on file  Other Topics Concern  . Not on file  Social History Narrative  . Not on file    Objective:  Physical Exam: BP 134/68 (BP Location: Left Arm, Patient Position: Sitting, Cuff Size: Normal)   Pulse (!) 55   Temp 98 F (36.7 C) (Oral)   Ht 5' 9"  (1.753 m)   Wt 199 lb 9.6 oz (90.5 kg)   SpO2 95%   BMI 29.48 kg/m   Body mass index is 29.48 kg/m. Wt Readings from Last 3 Encounters:  10/25/18 199 lb 9.6 oz (90.5 kg)  07/22/18 203 lb (92.1 kg)  05/04/18 200 lb (90.7 kg)   Gen: NAD, resting comfortably HEENT: TMs normal bilaterally. OP clear. No thyromegaly noted.  CV: RRR with no murmurs appreciated Pulm: NWOB, CTAB with no crackles, wheezes, or rhonchi GI: Normal bowel sounds present. Soft,  Nontender, Nondistended. MSK:  -Neck: No deformities.  Full range of motion throughout.  Tender to palpation along right paraspinal muscles. Skin: warm, dry.  3 mm hyperkeratotic lesion noted on left cheek. Neuro: CN2-12 grossly intact. Strength 5/5 in upper and lower extremities. Reflexes symmetric and intact bilaterally.  Psych: Normal affect and thought content  Cryotherapy Procedure Note  Pre-operative Diagnosis: Suspicious lesion  Locations: Left cheek  Indications: Therapeutic  Procedure Details  Patient informed of risks (permanent scarring, infection, light or dark discoloration, bleeding, infection, weakness, numbness and recurrence of the lesion) and benefits of the procedure and verbal informed consent obtained.  The areas are treated with liquid nitrogen therapy, frozen until ice ball extended 2 mm beyond lesion, allowed to thaw, and treated again. The patient tolerated procedure well.  The patient was instructed on post-op care, warned that there may be blister formation, redness and pain. Recommend OTC analgesia as needed for pain.  Condition: Stable  Complications: none.  Assessment/Plan:  Meralgia paresthetica of right side Reassured patient.  Normal exam.  Recommended loosening belt to see if this helps with symptoms.  Hyperlipidemia Check lipid panel.  Continue Crestor 10 mg daily.  Gastroesophageal reflux disease without esophagitis Stable.  Continue dietary modifications.  Can use Tums as needed.  Essential tremor Stable.  Continue propranolol 20 mg daily.  Discussed increasing dose versus switching to primidone.  Patient declined for today.  Patient will let me know if he would like to make a change.  Crohn's disease of both small and large intestine without complication ([place]) Stable.  Follows with GI.  Neck pain No red flags.  Normal exam.  Likely mild muscle sprain.  Recommended alternating heat and ice to the area.  Continue anti-inflammatories as  needed.  Discussed reasons return to care.  Rhinorrhea No red flag signs or symptoms.  Likely mild viral URI.  Continue with watchful waiting  Skin lesion Seborrheic keratosis versus AK.  Cryotherapy applied today.  Please see above procedure note.  Patient tolerated well.  Hyperglycemia Check A1c.  Preventative Healthcare: Check PSA and lipid panel.  Patient Counseling(The following topics were reviewed and/or handout was given):  -Nutrition: Stressed importance of moderation in sodium/caffeine intake, saturated fat and cholesterol, caloric balance, sufficient intake of fresh fruits, vegetables, and fiber.  -Stressed the importance of regular exercise.   -Substance Abuse: Discussed cessation/primary prevention of tobacco, alcohol, or other drug use; driving or other dangerous activities under the influence; availability of treatment for abuse.   -Injury prevention: Discussed safety belts, safety helmets, smoke detector, smoking near bedding or upholstery.   -Sexuality: Discussed sexually transmitted diseases, partner selection, use of condoms, avoidance of unintended pregnancy and contraceptive alternatives.   -Dental health: Discussed importance of regular tooth brushing, flossing, and dental visits.  -Health maintenance and immunizations reviewed. Please refer to Health maintenance section.  Return to care in 1 year for next preventative visit.   Algis Greenhouse. [name], MD 10/25/2018 12:38 PM

## 2018-10-25 NOTE — Assessment & Plan Note (Signed)
Stable.  Continue propranolol 20 mg daily.  Discussed increasing dose versus switching to primidone.  Patient declined for today.  Patient will let me know if he would like to make a change.

## 2018-10-25 NOTE — Assessment & Plan Note (Signed)
Stable.  Follows with GI.

## 2018-10-25 NOTE — Assessment & Plan Note (Signed)
Reassured patient.  Normal exam.  Recommended loosening belt to see if this helps with symptoms.

## 2018-10-25 NOTE — Assessment & Plan Note (Signed)
Stable.  Continue dietary modifications.  Can use Tums as needed.

## 2018-10-26 LAB — NMR, LIPOPROFILE
Cholesterol, Total: 176 mg/dL (ref 100–199)
HDL Particle Number: 28.5 umol/L — ABNORMAL LOW (ref >=30.5)
HDL-C: 39 mg/dL — ABNORMAL LOW (ref >39)
LDL Particle Number: 1459 nmol/L — ABNORMAL HIGH (ref <1000)
LDL Size: 20.5 nm — ABNORMAL LOW (ref >20.5)
LDL-C: 113 mg/dL — ABNORMAL HIGH (ref 0–99)
LP-IR Score: 64 — ABNORMAL HIGH (ref <=45)
Small LDL Particle Number: 733 nmol/L — ABNORMAL HIGH (ref <=527)
Triglycerides: 120 mg/dL (ref 0–149)

## 2018-10-26 NOTE — Progress Notes (Signed)
Please inform patient of the following:  His "bad" cholesterol was high and his "good" cholesterol was a bit low. I would like to increase his crestor to 65m daily - please send in a new rx if needed.  The rest of his blood work was normal.  He should continue working on a healthy, balanced diet and regular exercise and we can recheck in 1 year.  CAlgis Greenhouse PJerline Pain MD 10/26/2018 4:50 PM

## 2018-10-28 ENCOUNTER — Other Ambulatory Visit: Payer: Self-pay

## 2018-10-28 DIAGNOSIS — E785 Hyperlipidemia, unspecified: Secondary | ICD-10-CM

## 2018-10-28 MED ORDER — ROSUVASTATIN CALCIUM 20 MG PO TABS: 20.0000 mg | ORAL_TABLET | Freq: Every day | ORAL | 1 refills | Status: DC

## 2018-10-28 MED FILL — ROSUVASTATIN CALCIUM 20 MG: 20 | 90 days supply | Qty: 90 | Fill #0

## 2018-11-03 ENCOUNTER — Encounter: Payer: Self-pay | Admitting: Physical Therapy

## 2018-11-16 DIAGNOSIS — K5010 Crohn's disease of large intestine without complications: Secondary | ICD-10-CM | POA: Diagnosis not present

## 2018-11-16 DIAGNOSIS — K5289 Other specified noninfective gastroenteritis and colitis: Secondary | ICD-10-CM | POA: Diagnosis not present

## 2018-11-19 DIAGNOSIS — K5289 Other specified noninfective gastroenteritis and colitis: Secondary | ICD-10-CM | POA: Diagnosis not present

## 2018-11-19 DIAGNOSIS — K5010 Crohn's disease of large intestine without complications: Secondary | ICD-10-CM | POA: Diagnosis not present

## 2018-12-09 MED FILL — MESALAMINE DR 1.2 GM TABLET: 1.2 | 60 days supply | Qty: 120 | Fill #1

## 2018-12-09 MED FILL — PROPRANOLOL 20 MG TABLET: 20 | 90 days supply | Qty: 90 | Fill #1

## 2019-01-26 ENCOUNTER — Ambulatory Visit (HOSPITAL_COMMUNITY)
Admission: RE | Admit: 2019-01-26 | Discharge: 2019-01-26 | Disposition: A | Discharge status: Home / Self Care | Payer: PPO | Source: Ambulatory Visit | Attending: Internal Medicine | Admitting: Internal Medicine

## 2019-01-26 DIAGNOSIS — Z87891 Personal history of nicotine dependence: Secondary | ICD-10-CM | POA: Insufficient documentation

## 2019-01-27 NOTE — Progress Notes (Signed)
Please inform patient of the following:  His ultrasound was negative with no signs of aneurysm. Do not need to do any further testing.  Shaun Armstrong. [name], MD 01/27/2019 12:47 PM

## 2019-02-09 MED FILL — ROSUVASTATIN CALCIUM 20 MG: 20 | 90 days supply | Qty: 90 | Fill #1

## 2019-02-10 ENCOUNTER — Encounter: Payer: Self-pay | Admitting: Family Medicine

## 2019-02-17 ENCOUNTER — Other Ambulatory Visit: Payer: Self-pay

## 2019-02-17 DIAGNOSIS — H9190 Unspecified hearing loss, unspecified ear: Secondary | ICD-10-CM

## 2019-02-23 MED FILL — MESALAMINE DR 1.2 GM TABLET: 1.2 | 60 days supply | Qty: 120 | Fill #2

## 2019-02-23 MED FILL — PROPRANOLOL 20 MG TABLET: 20 | 90 days supply | Qty: 90 | Fill #2

## 2019-04-19 DIAGNOSIS — K5010 Crohn's disease of large intestine without complications: Secondary | ICD-10-CM | POA: Diagnosis not present

## 2019-05-04 DIAGNOSIS — H906 Mixed conductive and sensorineural hearing loss, bilateral: Secondary | ICD-10-CM | POA: Diagnosis not present

## 2019-05-06 ENCOUNTER — Other Ambulatory Visit: Payer: Self-pay | Admitting: Family Medicine

## 2019-05-06 DIAGNOSIS — E785 Hyperlipidemia, unspecified: Secondary | ICD-10-CM

## 2019-05-06 MED FILL — MESALAMINE 1.2 GM TBEC: 1.2 | 60 days supply | Qty: 120 | Fill #3

## 2019-05-06 MED FILL — ROSUVASTATIN CALCIUM 20 MG: 20 | 90 days supply | Qty: 90 | Fill #0

## 2019-05-18 ENCOUNTER — Other Ambulatory Visit: Payer: Self-pay

## 2019-05-18 NOTE — Patient Outreach (Signed)
[facility]) Care Management  05/18/2019  Shaun Armstrong [birth year] [id number]   Referral Date: 05/18/2019 Referral Source: Nurseline Referral Reason: patient questions if he has lyme disease.  Several recent tick bites.    Outreach Attempt: Spoke with patient. He is able to verify HIPAA.  Patient has since made an appointment with PCP on tomorrow.  Patient states that it will be a virtual visit.  Patient reports that he is taking his temperature daily and has not had a fever.  Patient declines any further needs or questions at this time.      Plan: RN CM will close case.     Jone Baseman, RN, MSN [facility] Care Management Care Management Coordinator Direct Line [phone] Toll Free: [phone]  Fax: [phone]

## 2019-05-19 ENCOUNTER — Other Ambulatory Visit: Payer: Self-pay

## 2019-05-19 ENCOUNTER — Other Ambulatory Visit (INDEPENDENT_AMBULATORY_CARE_PROVIDER_SITE_OTHER): Payer: PPO

## 2019-05-19 ENCOUNTER — Ambulatory Visit (INDEPENDENT_AMBULATORY_CARE_PROVIDER_SITE_OTHER): Payer: PPO | Admitting: Family Medicine

## 2019-05-19 ENCOUNTER — Encounter: Payer: Self-pay | Admitting: Family Medicine

## 2019-05-19 VITALS — Temp 97.6°F

## 2019-05-19 DIAGNOSIS — M2550 Pain in unspecified joint: Secondary | ICD-10-CM

## 2019-05-19 NOTE — Addendum Note (Signed)
Addended by: Vivi Barrack on: 05/19/2019 01:40 PM   Modules accepted: Orders

## 2019-05-19 NOTE — Progress Notes (Signed)
    Chief Complaint:  Shaun Armstrong is a 68 y.o. male who presents today for a virtual office visit with a chief complaint of joint pain.   Assessment/Plan:  Polyarthralgia Unlikely to be Lyme disease given lack of rash and other symptoms.  Additionally Lyme disease is very rare in [place].  Will check titers today per patient request.  Recommended Tylenol and/or Motrin as needed.  If above negative and has persistent pain, will need to in person office visit for further evaluation including possible x-rays.  Gust reasons return to care.     Subjective:  HPI:  Polyarthralgia Symptoms started several weeks ago.  Patient is concerned about possible Lyme disease.  States that he removed 18 takes over 22 days last month.  Some ticks were very small.  He did not have any rashes or fevers.  No chills.  He has had worsening joint aches over the past couple of days.  Predominantly located in his fingers and toes.  No swelling.  No redness.  ROS: Per HPI  PMH: He reports that he quit smoking about 39 years ago. His smoking use included cigarettes. He has a 4.00 pack-year smoking history. He has never used smokeless tobacco. He reports current alcohol use. He reports that he does not use drugs.      Objective/Observations  Physical Exam: Gen: NAD, resting comfortably Pulm: Normal work of breathing Neuro: Grossly normal, moves all extremities Psych: Normal affect and thought content  Virtual Visit via Video   I connected with Shaun Armstrong on 05/19/19 at 10:00 AM EDT by a video enabled telemedicine application and verified that I am speaking with the correct person using two identifiers. I discussed the limitations of evaluation and management by telemedicine and the availability of in person appointments. The patient expressed understanding and agreed to proceed.   Patient location: Home Provider location: [place] participating in the virtual visit: Myself  and Patient     Shaun Armstrong. [name], MD 05/19/2019 11:26 AM

## 2019-05-20 LAB — B. BURGDORFI ANTIBODIES: B burgdorferi Ab IgG+IgM: <0.9 index

## 2019-05-23 NOTE — Progress Notes (Signed)
Please inform patient of the following:  His lyme disease test is NEGATIVE. He does not need treatment. Would like for him to let us know if his joint aches have not improved.  Shaun Armstrong. [name], MD 05/23/2019 12:08 PM

## 2019-06-06 MED FILL — PROPRANOLOL 20 MG TABLET: 20 | 90 days supply | Qty: 90 | Fill #3

## 2019-07-05 MED FILL — MESALAMINE 1.2 GM TBEC: 1.2 | 60 days supply | Qty: 120 | Fill #4

## 2019-08-01 MED FILL — ROSUVASTATIN CALCIUM 20 MG: 20 | 90 days supply | Qty: 90 | Fill #1

## 2019-08-02 MED FILL — FLUAD QUADRIVALENT 0.5 ML P: 0.5 | 1 days supply | Qty: 1 | Fill #0

## 2019-08-04 DIAGNOSIS — M2041 Other hammer toe(s) (acquired), right foot: Secondary | ICD-10-CM | POA: Diagnosis not present

## 2019-08-04 DIAGNOSIS — M79675 Pain in left toe(s): Secondary | ICD-10-CM | POA: Diagnosis not present

## 2019-08-04 DIAGNOSIS — B351 Tinea unguium: Secondary | ICD-10-CM | POA: Diagnosis not present

## 2019-08-04 DIAGNOSIS — M79674 Pain in right toe(s): Secondary | ICD-10-CM | POA: Diagnosis not present

## 2019-08-04 DIAGNOSIS — L600 Ingrowing nail: Secondary | ICD-10-CM | POA: Diagnosis not present

## 2019-08-30 ENCOUNTER — Other Ambulatory Visit: Payer: Self-pay | Admitting: Family Medicine

## 2019-08-30 MED FILL — MESALAMINE 1.2 GM TBEC: 1.2 | 60 days supply | Qty: 120 | Fill #5

## 2019-08-30 MED FILL — PROPRANOLOL 20 MG TABLET: 20 | 90 days supply | Qty: 90 | Fill #0

## 2019-09-30 ENCOUNTER — Other Ambulatory Visit: Payer: Self-pay

## 2019-09-30 ENCOUNTER — Ambulatory Visit (INDEPENDENT_AMBULATORY_CARE_PROVIDER_SITE_OTHER): Payer: PPO | Admitting: Family Medicine

## 2019-09-30 ENCOUNTER — Encounter: Payer: Self-pay | Admitting: Family Medicine

## 2019-09-30 ENCOUNTER — Ambulatory Visit: Payer: PPO | Admitting: Family Medicine

## 2019-09-30 DIAGNOSIS — L249 Irritant contact dermatitis, unspecified cause: Secondary | ICD-10-CM | POA: Diagnosis not present

## 2019-09-30 MED ORDER — PREDNISONE 10 MG PO TABS: ORAL_TABLET | ORAL | 0 refills | Status: DC

## 2019-09-30 MED FILL — predniSONE 10 MG TABS: 10 | 14 days supply | Qty: 53 | Fill #0

## 2019-09-30 NOTE — Progress Notes (Signed)
Virtual Visit via Video Note Pt's with poor internet connection throughout the visit.  I connected with Shaun Armstrong on 09/30/19 at  1:30 PM EDT by a video enabled telemedicine application 2/2 [id number] pandemic and verified that I am speaking with the correct person using two identifiers.  Location patient: home  Location provider:work or home office Persons participating in the virtual visit: patient, provider, and pt's wife.  I discussed the limitations of evaluation and management by telemedicine and the availability of in person appointments. The patient expressed understanding and agreed to proceed.   HPI: Pt is a 68 yo male with pmh sig for essential tremor, HLD,  Crohn's dz, decreased hearing and vision followed by Dimas Chyle, MD.  Seen for acute concern.  Pt with rash on R RUE and abdomen that started Tues. Mildly pruritic with boils.  Similar to when pt got poison ivy yrs ago.  Pt tried calamine lotion, poison ivy spray, and benadryl cream.  Feels like rash is spreading/becoming worse.  Denies pain. Pt can only remember picking up a pile of wood. Pt states he popped a few of the blisters.  Pt states he is not currently around his pets.  Pt's wife does not have any new rash.  Pt denies fever, chills, n/v, cough.  Pt requesting a rx to be sent to [place] where his son is a pharmacist.  ROS: See pertinent positives and negatives per HPI.  Past Medical History:  Diagnosis Date  . 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 Crohn's disease ([place])   . Deafness in right ear, wears a microphone   . Decreased peripheral vision 11/04/2011  . Essential tremor   . HLD (hyperlipidemia)   . Inguinal hernia 07/2016  . Inguinal hernia   . Retinal detachment   . Wears hearing aid, left ear     Past Surgical History:  Procedure Laterality Date  . ACOUSTIC NEUROMA RESECTION Right   . BROW LIFT AND BLEPHAROPLASTY Bilateral 10/11/2012  . CATARACT EXTRACTION W/ INTRAOCULAR LENS  IMPLANT,  BILATERAL Bilateral   . HERNIA REPAIR    . INGUINAL HERNIA REPAIR Right 07/16/2016   Procedure: LAPAROSCOPIC RIGHT INGUINAL HERNIA;  Surgeon: Coralie Keens, MD;  Location: [place];  Service: General;  Laterality: Right;  . INSERTION OF MESH Right 07/16/2016   Procedure: INSERTION OF MESH;  Surgeon: Coralie Keens, MD;  Location: [place];  Service: General;  Laterality: Right;  . RETINAL DETACHMENT SURGERY Left   . TONSILLECTOMY      Family History  Problem Relation Age of Onset  . Diabetes Mother   . Dementia Mother   . Diabetes Sister      Current Outpatient Medications:  .  cholecalciferol (VITAMIN D) 1000 units tablet, Take 5,000 Units by mouth every other day. , Disp: , Rfl:  .  glucosamine-chondroitin 500-400 MG tablet, Take 1 tablet by mouth daily. , Disp: , Rfl:  .  mesalamine (LIALDA) 1.2 g EC tablet, Take 2.4 g by mouth daily with breakfast., Disp: , Rfl:  .  Multiple Vitamin (MULTIVITAMIN) tablet, Take 1 tablet by mouth daily., Disp: , Rfl:  .  Omega-3 Fatty Acids (FISH OIL PO), Take 2,000 mg by mouth daily. , Disp: , Rfl:  .  propranolol (INDERAL) 20 MG tablet, TAKE 1 TABLET (20 MG TOTAL) BY MOUTH DAILY., Disp: 90 tablet, Rfl: 0 .  rosuvastatin (CRESTOR) 20 MG tablet, TAKE 1 TABLET (20 MG TOTAL) BY MOUTH DAILY., Disp: 90 tablet,  Rfl: 1  EXAM:  VITALS per patient if applicable:  RR between 12-20 bpm  GENERAL: alert, oriented, appears well and in no acute distress  HEENT: atraumatic, conjunctiva clear, no obvious abnormalities on inspection of external nose and ears  NECK: normal movements of the head and neck  LUNGS: on inspection no signs of respiratory distress, breathing rate appears normal, no obvious gross SOB, gasping or wheezing  CV: no obvious cyanosis  MS: moves all visible extremities without noticeable abnormality  PSYCH/NEURO: pleasant and cooperative, no obvious depression or anxiety, speech and thought processing  grossly intact  ASSESSMENT AND PLAN:  Discussed the following assessment and plan:  Irritant contact dermatitis, unspecified trigger -pt advised to keep areas clean and dry.  Avoid popping blisters -ok to continue supportive care- calamine lotion, etc. -will start prednisone taper -Plan: predniSONE (DELTASONE) 10 MG tablet  F/u with pcp prn   I discussed the assessment and treatment plan with the patient. The patient was provided an opportunity to ask questions and all were answered. The patient agreed with the plan and demonstrated an understanding of the instructions.   The patient was advised to call back or seek an in-person evaluation if the symptoms worsen or if the condition fails to improve as anticipated.   Billie Ruddy, MD

## 2019-10-24 ENCOUNTER — Other Ambulatory Visit: Payer: Self-pay | Admitting: Family Medicine

## 2019-10-24 DIAGNOSIS — E785 Hyperlipidemia, unspecified: Secondary | ICD-10-CM

## 2019-10-24 MED FILL — ROSUVASTATIN CALCIUM 20 MG: 20 | 90 days supply | Qty: 90 | Fill #0

## 2019-11-09 ENCOUNTER — Encounter: Payer: Self-pay | Admitting: Family Medicine

## 2019-11-10 MED FILL — MESALAMINE 1.2 GM TBEC: 1.2 | 60 days supply | Qty: 120 | Fill #0

## 2019-11-22 ENCOUNTER — Other Ambulatory Visit: Payer: Self-pay

## 2019-11-23 ENCOUNTER — Ambulatory Visit (INDEPENDENT_AMBULATORY_CARE_PROVIDER_SITE_OTHER): Payer: PPO | Admitting: Family Medicine

## 2019-11-23 ENCOUNTER — Encounter: Payer: Self-pay | Admitting: Family Medicine

## 2019-11-23 VITALS — BP 138/79 | HR 59 | Temp 97.7°F | Ht 69.0 in | Wt 198.2 lb

## 2019-11-23 DIAGNOSIS — K5080 Crohn's disease of both small and large intestine without complications: Secondary | ICD-10-CM | POA: Diagnosis not present

## 2019-11-23 DIAGNOSIS — L578 Other skin changes due to chronic exposure to nonionizing radiation: Secondary | ICD-10-CM

## 2019-11-23 DIAGNOSIS — Z125 Encounter for screening for malignant neoplasm of prostate: Secondary | ICD-10-CM

## 2019-11-23 DIAGNOSIS — L570 Actinic keratosis: Secondary | ICD-10-CM | POA: Diagnosis not present

## 2019-11-23 DIAGNOSIS — E785 Hyperlipidemia, unspecified: Secondary | ICD-10-CM | POA: Diagnosis not present

## 2019-11-23 DIAGNOSIS — K219 Gastro-esophageal reflux disease without esophagitis: Secondary | ICD-10-CM

## 2019-11-23 DIAGNOSIS — R739 Hyperglycemia, unspecified: Secondary | ICD-10-CM

## 2019-11-23 DIAGNOSIS — Z0001 Encounter for general adult medical examination with abnormal findings: Secondary | ICD-10-CM | POA: Diagnosis not present

## 2019-11-23 DIAGNOSIS — G250 Essential tremor: Secondary | ICD-10-CM | POA: Diagnosis not present

## 2019-11-23 LAB — COMPREHENSIVE METABOLIC PANEL
ALT: 23 U/L (ref 0–53)
AST: 29 U/L (ref 0–37)
Albumin: 4.2 g/dL (ref 3.5–5.2)
Alkaline Phosphatase: 60 U/L (ref 39–117)
BUN: 21 mg/dL (ref 6–23)
CO2: 31 mEq/L (ref 19–32)
Calcium: 11 mg/dL — ABNORMAL HIGH (ref 8.4–10.5)
Chloride: 107 mEq/L (ref 96–112)
Creatinine, Ser: 0.9 mg/dL (ref 0.40–1.50)
GFR: 83.58 mL/min (ref >60.00)
Glucose, Bld: 87 mg/dL (ref 70–99)
Potassium: 4.6 mEq/L (ref 3.5–5.1)
Sodium: 141 mEq/L (ref 135–145)
Total Bilirubin: 0.7 mg/dL (ref 0.2–1.2)
Total Protein: 6.1 g/dL (ref 6.0–8.3)

## 2019-11-23 LAB — CBC
HCT: 46.8 % (ref 39.0–52.0)
Hemoglobin: 15.6 g/dL (ref 13.0–17.0)
MCHC: 33.4 g/dL (ref 30.0–36.0)
MCV: 90.3 fl (ref 78.0–100.0)
Platelets: 183 10*3/uL (ref 150.0–400.0)
RBC: 5.19 Mil/uL (ref 4.22–5.81)
RDW: 13.4 % (ref 11.5–15.5)
WBC: 5.7 10*3/uL (ref 4.0–10.5)

## 2019-11-23 LAB — HEMOGLOBIN A1C: Hgb A1c MFr Bld: 5.7 % (ref 4.6–6.5)

## 2019-11-23 LAB — LIPID PANEL
Cholesterol: 167 mg/dL (ref 0–200)
HDL: 31 mg/dL — ABNORMAL LOW (ref >39.00)
LDL Cholesterol: 112 mg/dL — ABNORMAL HIGH (ref 0–99)
NonHDL: 136.01
Total CHOL/HDL Ratio: 5
Triglycerides: 120 mg/dL (ref 0.0–149.0)
VLDL: 24 mg/dL (ref 0.0–40.0)

## 2019-11-23 LAB — PSA: PSA: 2.01 ng/mL (ref 0.10–4.00)

## 2019-11-23 LAB — TSH: TSH: 2.42 u[IU]/mL (ref 0.35–4.50)

## 2019-11-23 MED ORDER — PROPRANOLOL HCL 20 MG PO TABS: 40.0000 mg | ORAL_TABLET | Freq: Every day | ORAL | 3 refills | Status: DC

## 2019-11-23 MED FILL — PROPRANOLOL 20 MG TABLET: 20 | 90 days supply | Qty: 180 | Fill #0

## 2019-11-23 NOTE — Assessment & Plan Note (Addendum)
Will increase to 20m daily. Discussed potential side effects. Will increase to 671mif needed and tolerating 4049mose well.

## 2019-11-23 NOTE — Assessment & Plan Note (Signed)
Continue management per GI.

## 2019-11-23 NOTE — Assessment & Plan Note (Signed)
Several AK's noted.  Treated with cryotherapy-see below procedure note.  He tolerated well.

## 2019-11-23 NOTE — Patient Instructions (Signed)
It was very nice to see you today!  We will check blood work today.  We froze spots on your arms, face and back today.  We will increase her propranolol to 40 mg daily.  Come back to see me in 1 year for your next physical, or sooner if needed.  Take care, Dr [name]  Please try these tips to maintain a healthy lifestyle:   Eat at least 3 REAL meals and 1-2 snacks per day.  Aim for no more than 5 hours between eating.  If you eat breakfast, please do so within one hour of getting up.    Each meal should contain half fruits/vegetables, one quarter protein, and one quarter carbs (no bigger than a computer mouse)   Cut down on sweet beverages. This includes juice, soda, and sweet tea.     Drink at least 1 glass of water with each meal and aim for at least 8 glasses per day   Exercise at least 150 minutes every week.    Preventive Care 63 Years and Older, Male Preventive care refers to lifestyle choices and visits with your health care provider that can promote health and wellness. This includes:  A yearly physical exam. This is also called an annual well check.  Regular dental and eye exams.  Immunizations.  Screening for certain conditions.  Healthy lifestyle choices, such as diet and exercise. What can I expect for my preventive care visit? Physical exam Your health care provider will check:  Height and weight. These may be used to calculate body mass index (BMI), which is a measurement that tells if you are at a healthy weight.  Heart rate and blood pressure.  Your skin for abnormal spots. Counseling Your health care provider may ask you questions about:  Alcohol, tobacco, and drug use.  Emotional well-being.  Home and relationship well-being.  Sexual activity.  Eating habits.  History of falls.  Memory and ability to understand (cognition).  Work and work Statistician. What immunizations do I need?  Influenza (flu) vaccine  This is recommended  every year. Tetanus, diphtheria, and pertussis (Tdap) vaccine  You may need a Td booster every 10 years. Varicella (chickenpox) vaccine  You may need this vaccine if you have not already been vaccinated. Zoster (shingles) vaccine  You may need this after age 86. Pneumococcal conjugate (PCV13) vaccine  One dose is recommended after age 100. Pneumococcal polysaccharide (PPSV23) vaccine  One dose is recommended after age 45. Measles, mumps, and rubella (MMR) vaccine  You may need at least one dose of MMR if you were born in 1957 or later. You may also need a second dose. Meningococcal conjugate (MenACWY) vaccine  You may need this if you have certain conditions. Hepatitis A vaccine  You may need this if you have certain conditions or if you travel or work in places where you may be exposed to hepatitis A. Hepatitis B vaccine  You may need this if you have certain conditions or if you travel or work in places where you may be exposed to hepatitis B. Haemophilus influenzae type b (Hib) vaccine  You may need this if you have certain conditions. You may receive vaccines as individual doses or as more than one vaccine together in one shot (combination vaccines). Talk with your health care provider about the risks and benefits of combination vaccines. What tests do I need? Blood tests  Lipid and cholesterol levels. These may be checked every 5 years, or more frequently depending on  your overall health.  Hepatitis C test.  Hepatitis B test. Screening  Lung cancer screening. You may have this screening every year starting at age 77 if you have a 30-pack-year history of smoking and currently smoke or have quit within the past 15 years.  Colorectal cancer screening. All adults should have this screening starting at age 33 and continuing until age 18. Your health care provider may recommend screening at age 68 if you are at increased risk. You will have tests every 1-10 years, depending  on your results and the type of screening test.  Prostate cancer screening. Recommendations will vary depending on your family history and other risks.  Diabetes screening. This is done by checking your blood sugar (glucose) after you have not eaten for a while (fasting). You may have this done every 1-3 years.  Abdominal aortic aneurysm (AAA) screening. You may need this if you are a current or former smoker.  Sexually transmitted disease (STD) testing. Follow these instructions at home: Eating and drinking  Eat a diet that includes fresh fruits and vegetables, whole grains, lean protein, and low-fat dairy products. Limit your intake of foods with high amounts of sugar, saturated fats, and salt.  Take vitamin and mineral supplements as recommended by your health care provider.  Do not drink alcohol if your health care provider tells you not to drink.  If you drink alcohol: ? Limit how much you have to 0-2 drinks a day. ? Be aware of how much alcohol is in your drink. In the [place] one drink equals one 12 oz bottle of beer (355 mL), one 5 oz glass of wine (148 mL), or one 1 oz glass of hard liquor (44 mL). Lifestyle  Take daily care of your teeth and gums.  Stay active. Exercise for at least 30 minutes on 5 or more days each week.  Do not use any products that contain nicotine or tobacco, such as cigarettes, e-cigarettes, and chewing tobacco. If you need help quitting, ask your health care provider.  If you are sexually active, practice safe sex. Use a condom or other form of protection to prevent STIs (sexually transmitted infections).  Talk with your health care provider about taking a low-dose aspirin or statin. What's next?  Visit your health care provider once a year for a well check visit.  Ask your health care provider how often you should have your eyes and teeth checked.  Stay up to date on all vaccines. This information is not intended to replace advice given to you  by your health care provider. Make sure you discuss any questions you have with your health care provider. Document Released: 12/21/2015 Document Revised: 11/18/2018 Document Reviewed: 11/18/2018 Elsevier Patient Education  2020 [employer].

## 2019-11-23 NOTE — Progress Notes (Addendum)
Chief Complaint:  Shaun Armstrong is a 68 y.o. male who presents today for his annual comprehensive physical exam and subsequent medicare annual wellness visit.    Assessment/Plan:  Essential tremor Will increase to 73m daily. Discussed potential side effects. Will increase to 656mif needed and tolerating 4024mose well.   Sun-damaged skin Several AK's noted.  Treated with cryotherapy-see below procedure note.  He tolerated well.  Hyperlipidemia Check lipid panel.  Continue Crestor 20 mg daily.  Gastroesophageal reflux disease without esophagitis Continue management per GI.   Crohn's disease of both small and large intestine without complication ([place] management per GI.    Preventative Healthcare: Check CBC, C met, TSH, lipid panel.  Screen for prostate cancer with PSA.  Declined pneumonia vaccine.  Had colonoscopy done last year and will have a couple of years.  Had flu vaccine earlier this year.  Patient Counseling(The following topics were reviewed and/or handout was given):  -Nutrition: Stressed importance of moderation in sodium/caffeine intake, saturated fat and cholesterol, caloric balance, sufficient intake of fresh fruits, vegetables, and fiber.  -Stressed the importance of regular exercise.   -Substance Abuse: Discussed cessation/primary prevention of tobacco, alcohol, or other drug use; driving or other dangerous activities under the influence; availability of treatment for abuse.   -Injury prevention: Discussed safety belts, safety helmets, smoke detector, smoking near bedding or upholstery.   -Sexuality: Discussed sexually transmitted diseases, partner selection, use of condoms, avoidance of unintended pregnancy and contraceptive alternatives.   -Dental health: Discussed importance of regular tooth brushing, flossing, and dental visits.  -Health maintenance and immunizations reviewed. Please refer to Health maintenance section.  During the course of the visit  the patient was educated and counseled about appropriate screening and preventive services including:        Fall prevention   Nutrition Physical Activity Weight Management Cognition  Return to care in 1 year for next preventative visit.     Subjective:  HPI:  Health Risk Assessment: Patient considers his overall health to be good. He has no difficulty performing the following: . Preparing food and eating . Bathing  . Getting dressed . Using the toilet . Shopping . Managing Finances . Moving around from place to place  Had a mechanical fall within the last year. No injuries. Stated that he tripped over rock.   Depression screen PHQ 2/9 11/23/2019  Decreased Interest 0  Down, Depressed, Hopeless 0  PHQ - 2 Score 0   Lifestyle Factors: Diet: None.  Exercise: Likes to walk and play golf. Plays basketball with grandson.   Patient Care Team: ParVivi BarrackD as PCP - General (Family Medicine) BucRonald LoboD as Consulting Physician (Gastroenterology) GeoDerrek Gu Consulting Physician (Dentistry) TanMarygrace DroughtD as Consulting Physician (Ophthalmology) JacMylinda Latina Consulting Physician (Ophthalmology) FryJohna RolesD as Consulting Physician   His chronic medical conditions are outlined below:  # Essential Tremor - On propranolol 20 mg daily and tolerating well - Would like to try higher dose  #Dyslipidemia - On Crestor 10 mg daily tolerating well - ROS: No reported myalgias.  # GERD - Uses Tums as needed  # Crohn's Disease - Follows with GI - On lialda 2.4g daily - Gets colonoscopy every 2 years  ROS: Per HPI, otherwise a complete review of systems was negative.   PMH:  The following were reviewed and entered/updated in epic: Past Medical History:  Diagnosis Date  . 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 Crohn's disease ([facility] .  Deafness in right ear, wears a microphone   . Decreased peripheral vision 11/04/2011  . Essential tremor   .  HLD (hyperlipidemia)   . Inguinal hernia 07/2016  . Inguinal hernia   . Retinal detachment   . Wears hearing aid, left ear    Patient Active Problem List   Diagnosis Date Noted  . Meralgia paresthetica of right side 10/25/2018  . Gastroesophageal reflux disease without esophagitis 10/25/2018  . Sun-damaged skin 08/03/2017  . Essential tremor 06/05/2016  . 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 Crohn's disease of both small and large intestine without complication ([place]) 46/50/3546  . Hyperlipidemia 10/05/2012  . Decreased peripheral vision 11/04/2011   Past Surgical History:  Procedure Laterality Date  . ACOUSTIC NEUROMA RESECTION Right   . BROW LIFT AND BLEPHAROPLASTY Bilateral 10/11/2012  . CATARACT EXTRACTION W/ INTRAOCULAR LENS  IMPLANT, BILATERAL Bilateral   . HERNIA REPAIR    . INGUINAL HERNIA REPAIR Right 07/16/2016   Procedure: LAPAROSCOPIC RIGHT INGUINAL HERNIA;  Surgeon: Coralie Keens, MD;  Location: [facility];  Service: General;  Laterality: Right;  . INSERTION OF MESH Right 07/16/2016   Procedure: INSERTION OF MESH;  Surgeon: Coralie Keens, MD;  Location: [facility];  Service: General;  Laterality: Right;  . RETINAL DETACHMENT SURGERY Left   . TONSILLECTOMY      Family History  Problem Relation Age of Onset  . Diabetes Mother   . Dementia Mother   . Diabetes Sister     Medications- reviewed and updated Current Outpatient Medications  Medication Sig Dispense Refill  . cholecalciferol (VITAMIN D) 1000 units tablet Take 5,000 Units by mouth every other day.     Marland Kitchen glucosamine-chondroitin 500-400 MG tablet Take 1 tablet by mouth daily.     . mesalamine (LIALDA) 1.2 g EC tablet Take 2.4 g by mouth daily with breakfast.    . Multiple Vitamin (MULTIVITAMIN) tablet Take 1 tablet by mouth daily.    . Omega-3 Fatty Acids (FISH OIL PO) Take 2,000 mg by mouth daily.     . propranolol (INDERAL) 20 MG tablet Take 2 tablets (40 mg total) by mouth  daily. 180 tablet 3  . rosuvastatin (CRESTOR) 20 MG tablet TAKE 1 TABLET (20 MG TOTAL) BY MOUTH DAILY. 90 tablet 1   No current facility-administered medications for this visit.    Allergies-reviewed and updated Allergies  Allergen Reactions  . Other Itching    OCTOPUS  . Ceftin [Cefuroxime Axetil] Other (See Comments)    Social History   Socioeconomic History  . Marital status: Married    Spouse name: Not on file  . Number of children: Not on file  . Years of education: Not on file  . Highest education level: Not on file  Occupational History  . Not on file  Tobacco Use  . Smoking status: Former Smoker    Packs/day: 1.00    Years: 4.00    Pack years: 4.00    Types: Cigarettes    Quit date: 12/09/1979    Years since quitting: 39.9  . Smokeless tobacco: Never Used  . Tobacco comment: Quit 38 years ago  Substance and Sexual Activity  . Alcohol use: Yes    Comment: recent ER visit fall after having a few beers  . Drug use: No  . Sexual activity: Not on file  Other Topics Concern  . Not on file  Social History Narrative  . Not on file   Social Determinants of Health  Financial Resource Strain:   . Difficulty of Paying Living Expenses: Not on file  Food Insecurity:   . Worried About Charity fundraiser in the Last Year: Not on file  . Ran Out of Food in the Last Year: Not on file  Transportation Needs:   . Lack of Transportation (Medical): Not on file  . Lack of Transportation (Non-Medical): Not on file  Physical Activity:   . Days of Exercise per Week: Not on file  . Minutes of Exercise per Session: Not on file  Stress:   . Feeling of Stress : Not on file  Social Connections:   . Frequency of Communication with Friends and Family: Not on file  . Frequency of Social Gatherings with Friends and Family: Not on file  . Attends Religious Services: Not on file  . Active Member of Clubs or Organizations: Not on file  . Attends Archivist Meetings: Not on  file  . Marital Status: Not on file        Objective:  Physical Exam: BP 138/79   Pulse (!) 59   Temp 97.7 F (36.5 C)   Ht 5' 9"  (1.753 m)   Wt 198 lb 4 oz (89.9 kg)   SpO2 96%   BMI 29.28 kg/m   Body mass index is 29.28 kg/m. Wt Readings from Last 3 Encounters:  11/23/19 198 lb 4 oz (89.9 kg)  10/25/18 199 lb 9.6 oz (90.5 kg)  07/22/18 203 lb (92.1 kg)   Gen: NAD, resting comfortably HEENT: TMs normal bilaterally. OP clear. No thyromegaly noted.  CV: RRR with no murmurs appreciated Pulm: NWOB, CTAB with no crackles, wheezes, or rhonchi GI: Normal bowel sounds present. Soft, Nontender, Nondistended. MSK: no edema, cyanosis, or clubbing noted Skin: warm, dry. Several hyperkeratotic lesions noted scattered across face, scalp, arms.  Neuro: CN2-12 grossly intact. Strength 5/5 in upper and lower extremities. Reflexes symmetric and intact bilaterally. Normal minicog.  Psych: Normal affect and thought content  Cryotherapy Procedure Note  Pre-operative Diagnosis: Actinic keratosis  Locations: Face, scalp, bilateral arms  Indications: Therapeutic  Procedure Details  Patient informed of risks (permanent scarring, infection, light or dark discoloration, bleeding, infection, weakness, numbness and recurrence of the lesion) and benefits of the procedure and verbal informed consent obtained.  The areas are treated with liquid nitrogen therapy, frozen until ice ball extended 3 mm beyond lesion, allowed to thaw, and treated again. A total of 12 lesions were treated. The patient tolerated procedure well.  The patient was instructed on post-op care, warned that there may be blister formation, redness and pain. Recommend OTC analgesia as needed for pain.  Condition: Stable  Complications: none.       Algis Greenhouse. [name], MD 11/23/2019 10:32 AM

## 2019-11-23 NOTE — Assessment & Plan Note (Signed)
Check lipid panel.  Continue Crestor 20 mg daily.

## 2019-11-24 NOTE — Progress Notes (Signed)
Please inform patient of the following:  Overall his labs are stable. His "bad" cholesterol went up slightly, but everything else is NORMAL. Would like for him to keep up the good work with diet and exercise and we can recheck in a year or so.

## 2019-11-28 ENCOUNTER — Telehealth: Payer: Self-pay | Admitting: Family Medicine

## 2019-11-28 NOTE — Telephone Encounter (Signed)
Patient called and was read lab note of Dr [name] 11/24/2019. He verbalized understanding.

## 2020-01-04 ENCOUNTER — Ambulatory Visit: Payer: PPO

## 2020-01-04 DIAGNOSIS — H524 Presbyopia: Secondary | ICD-10-CM | POA: Diagnosis not present

## 2020-01-04 DIAGNOSIS — H43813 Vitreous degeneration, bilateral: Secondary | ICD-10-CM | POA: Diagnosis not present

## 2020-01-04 DIAGNOSIS — H26492 Other secondary cataract, left eye: Secondary | ICD-10-CM | POA: Diagnosis not present

## 2020-01-04 DIAGNOSIS — H35372 Puckering of macula, left eye: Secondary | ICD-10-CM | POA: Diagnosis not present

## 2020-01-25 ENCOUNTER — Ambulatory Visit: Payer: PPO

## 2020-02-02 MED FILL — MESALAMINE 1.2 GM TBEC: 1.2 | 60 days supply | Qty: 120 | Fill #1

## 2020-02-02 MED FILL — ROSUVASTATIN CALCIUM 20 MG: 20 | 90 days supply | Qty: 90 | Fill #1

## 2020-03-05 MED FILL — PROPRANOLOL 20 MG TABLET: 20 | 90 days supply | Qty: 180 | Fill #1

## 2020-04-02 MED FILL — MESALAMINE 1.2 GM TBEC: 1.2 | 60 days supply | Qty: 120 | Fill #2

## 2020-04-16 ENCOUNTER — Encounter: Payer: Self-pay | Admitting: Family Medicine

## 2020-04-16 NOTE — Telephone Encounter (Signed)
Patient is requesting to see Dr [name] in office.Was offered an appointment with Morene Rankins and he declined. Please advise.

## 2020-04-17 ENCOUNTER — Other Ambulatory Visit: Payer: Self-pay

## 2020-04-17 ENCOUNTER — Encounter: Payer: Self-pay | Admitting: Family Medicine

## 2020-04-17 ENCOUNTER — Ambulatory Visit (INDEPENDENT_AMBULATORY_CARE_PROVIDER_SITE_OTHER): Payer: PPO | Admitting: Family Medicine

## 2020-04-17 VITALS — BP 122/70 | HR 54 | Temp 97.5°F | Ht 69.0 in | Wt 198.2 lb

## 2020-04-17 DIAGNOSIS — E785 Hyperlipidemia, unspecified: Secondary | ICD-10-CM

## 2020-04-17 DIAGNOSIS — K5080 Crohn's disease of both small and large intestine without complications: Secondary | ICD-10-CM

## 2020-04-17 DIAGNOSIS — R5383 Other fatigue: Secondary | ICD-10-CM

## 2020-04-17 NOTE — Telephone Encounter (Signed)
Appointment schedule

## 2020-04-17 NOTE — Progress Notes (Signed)
   Shaun Armstrong is a 69 y.o. male who presents today for an office visit.  Assessment/Plan:  New/Acute Problems: Fatigue Unclear etiology.  Will check labs including CBC, C met, TSH, and B12.  We will also check Lyme disease antibodies per patient request.  If above labs are negative will need referral for sleep study..  Bilateral calf pain History concerning for claudication however has good distal pulses.  Will check labs as above.  If above labs are normal will need referral for lower extremity Dopplers.  Chronic Problems Addressed Today: Crohn's disease of both small and large intestine without complication ([place]) Very mild however increases risk for B12 deficiency.  Will check labs as above.  He will continue Lialda 2.4 g daily per GI.  Hyperlipidemia Given exertional nature of calf pain doubt that this represents reaction to Crestor.  We will continue to milligrams daily.  Check CMET as above.     Subjective:  HPI:  Patient with increased fatigue for the past 3 to 4 months.  Has been sleeping about 8 to 12 hours nightly.  This occurred suddenly.  Was previously okay with 7 hours of sleep.  Feels very tired in the afternoon and evening.  Wife says that he has a light snore.  No Paroxysmal dyspnea.  For the last few weeks has also had worsening bilateral calf pain.  This occurs almost always with walking for a short distance.  Subsides after resting.       Objective:  Physical Exam: BP 122/70 (BP Location: Left Arm, Patient Position: Sitting, Cuff Size: Normal)   Pulse (!) 54   Temp (!) 97.5 F (36.4 C) (Temporal)   Ht 5' 9"  (1.753 m)   Wt 198 lb 3.2 oz (89.9 kg)   SpO2 98%   BMI 29.27 kg/m   Gen: No acute distress, resting comfortably CV: Regular rate and rhythm with no murmurs appreciated Pulm: Normal work of breathing, clear to auscultation bilaterally with no crackles, wheezes, or rhonchi MSK: Bilateral lower extremities with 2+ DP and PT pulses.  Neurovascular  intact distally.  Nontender to palpation.  Bilateral knees with crepitus. Neuro: Grossly normal, moves all extremities Psych: Normal affect and thought content      Rosezetta Balderston M. [name], MD 04/17/2020 4:18 PM

## 2020-04-17 NOTE — Assessment & Plan Note (Signed)
Very mild however increases risk for B12 deficiency.  Will check labs as above.  He will continue Lialda 2.4 g daily per GI.

## 2020-04-17 NOTE — Patient Instructions (Signed)
It was very nice to see you today!  We will check blood work today.  I am concerned that you may have a iron or B12 deficiency.  Depending on the results we may need to have an ultrasound of your leg and a sleep study.  We will contact you in the next day or 2 once the results come back.  Take care, Dr [name]  Please try these tips to maintain a healthy lifestyle:   Eat at least 3 REAL meals and 1-2 snacks per day.  Aim for no more than 5 hours between eating.  If you eat breakfast, please do so within one hour of getting up.    Each meal should contain half fruits/vegetables, one quarter protein, and one quarter carbs (no bigger than a computer mouse)   Cut down on sweet beverages. This includes juice, soda, and sweet tea.     Drink at least 1 glass of water with each meal and aim for at least 8 glasses per day   Exercise at least 150 minutes every week.

## 2020-04-17 NOTE — Assessment & Plan Note (Signed)
Given exertional nature of calf pain doubt that this represents reaction to Crestor.  We will continue to milligrams daily.  Check CMET as above.

## 2020-04-18 LAB — COMPREHENSIVE METABOLIC PANEL
ALT: 31 U/L (ref 0–53)
AST: 34 U/L (ref 0–37)
Albumin: 4.3 g/dL (ref 3.5–5.2)
Alkaline Phosphatase: 65 U/L (ref 39–117)
BUN: 30 mg/dL — ABNORMAL HIGH (ref 6–23)
CO2: 29 mEq/L (ref 19–32)
Calcium: 11.9 mg/dL — ABNORMAL HIGH (ref 8.4–10.5)
Chloride: 108 mEq/L (ref 96–112)
Creatinine, Ser: 1.13 mg/dL (ref 0.40–1.50)
GFR: 64.2 mL/min (ref >60.00)
Glucose, Bld: 98 mg/dL (ref 70–99)
Potassium: 5.1 mEq/L (ref 3.5–5.1)
Sodium: 143 mEq/L (ref 135–145)
Total Bilirubin: 0.4 mg/dL (ref 0.2–1.2)
Total Protein: 6.1 g/dL (ref 6.0–8.3)

## 2020-04-18 LAB — CBC
HCT: 46.4 % (ref 39.0–52.0)
Hemoglobin: 15.4 g/dL (ref 13.0–17.0)
MCHC: 33.1 g/dL (ref 30.0–36.0)
MCV: 91.2 fl (ref 78.0–100.0)
Platelets: 217 10*3/uL (ref 150.0–400.0)
RBC: 5.09 Mil/uL (ref 4.22–5.81)
RDW: 13.6 % (ref 11.5–15.5)
WBC: 7.6 10*3/uL (ref 4.0–10.5)

## 2020-04-18 LAB — B. BURGDORFI ANTIBODIES: B burgdorferi Ab IgG+IgM: <0.9 index

## 2020-04-18 LAB — VITAMIN B12: Vitamin B-12: 466 pg/mL (ref 211–911)

## 2020-04-18 LAB — TSH: TSH: 3.03 u[IU]/mL (ref 0.35–4.50)

## 2020-04-19 ENCOUNTER — Other Ambulatory Visit: Payer: Self-pay

## 2020-04-19 ENCOUNTER — Telehealth: Payer: Self-pay | Admitting: Family Medicine

## 2020-04-19 NOTE — Telephone Encounter (Signed)
Left message to return call to our office at their convenience.  

## 2020-04-19 NOTE — Progress Notes (Signed)
Please inform patient of the following:  His calcium level is high but everything else is NORMAL. Would like for him to come back to recheck a few labs to see if we can figure out what's causing his calcium to be elevated. Please place future order for ionized calcium, PTH, and vitamin D levels.   Shaun Armstrong. [name], MD 04/19/2020 9:32 AM

## 2020-04-19 NOTE — Telephone Encounter (Signed)
.  smg

## 2020-04-19 NOTE — Telephone Encounter (Signed)
Pt returned your call regarding lab work. Pt requested for you to call his cell phone number.

## 2020-04-20 ENCOUNTER — Other Ambulatory Visit (INDEPENDENT_AMBULATORY_CARE_PROVIDER_SITE_OTHER): Payer: PPO

## 2020-04-20 ENCOUNTER — Other Ambulatory Visit: Payer: Self-pay

## 2020-04-20 LAB — VITAMIN D 25 HYDROXY (VIT D DEFICIENCY, FRACTURES): VITD: 53.99 ng/mL (ref 30.00–100.00)

## 2020-04-23 LAB — CALCIUM, IONIZED: Calcium, Ion: 5.9 mg/dL — ABNORMAL HIGH (ref 4.8–5.6)

## 2020-04-23 LAB — PTH, INTACT AND CALCIUM
Calcium: 10.6 mg/dL — ABNORMAL HIGH (ref 8.6–10.3)
PTH: 103 pg/mL — ABNORMAL HIGH (ref 14–64)

## 2020-04-23 NOTE — Progress Notes (Signed)
Please inform patient of the following:  Patient's parathyroid hormone is elevated. This could explain some of his symptoms. Recommend referral to endocrinology for further evaluation.  Shaun Armstrong. [name], MD 04/23/2020 12:59 PM

## 2020-04-25 ENCOUNTER — Other Ambulatory Visit: Payer: Self-pay | Admitting: *Deleted

## 2020-04-25 DIAGNOSIS — E349 Endocrine disorder, unspecified: Secondary | ICD-10-CM

## 2020-04-25 NOTE — Progress Notes (Signed)
Referral placed for Endocrinology in [place].

## 2020-04-27 ENCOUNTER — Telehealth: Payer: Self-pay | Admitting: Family Medicine

## 2020-04-27 NOTE — Telephone Encounter (Signed)
Patient returning call, says he cant understand Shaun Armstrong could someone else call him back. Please adviseJk

## 2020-04-27 NOTE — Telephone Encounter (Signed)
Spoke to pt told him I left message about Parathyroid hormone is elevated per Dr. [name] and could explain some of your symptoms. We have placed a referral for you to see Endocrinology and someone will contact you to schedule an appt. Pt verbalized understanding and said they have been out of town that is why they did not call back. Told him that is okay.

## 2020-06-06 ENCOUNTER — Other Ambulatory Visit: Payer: Self-pay | Admitting: Family Medicine

## 2020-06-06 DIAGNOSIS — E785 Hyperlipidemia, unspecified: Secondary | ICD-10-CM

## 2020-06-06 MED FILL — MESALAMINE 1.2 GM TBEC: 1.2 | 60 days supply | Qty: 120 | Fill #3

## 2020-06-06 MED FILL — ROSUVASTATIN CALCIUM 20 MG: 20 | 90 days supply | Qty: 90 | Fill #0

## 2020-06-06 MED FILL — PROPRANOLOL 20 MG TABLET: 20 | 90 days supply | Qty: 180 | Fill #2

## 2020-06-07 ENCOUNTER — Encounter: Payer: Self-pay | Admitting: Endocrinology

## 2020-06-07 ENCOUNTER — Other Ambulatory Visit: Payer: Self-pay

## 2020-06-07 ENCOUNTER — Ambulatory Visit: Payer: PPO | Admitting: Endocrinology

## 2020-06-07 DIAGNOSIS — E210 Primary hyperparathyroidism: Secondary | ICD-10-CM

## 2020-06-07 HISTORY — DX: Primary hyperparathyroidism: E21.0

## 2020-06-07 NOTE — Progress Notes (Signed)
Subjective:    Patient ID: Shaun Armstrong, male    DOB: 1951-06-03, 69 y.o.   MRN: [id number]  HPI Pt is referred by Dr [name], for hypercalcemia.  Pt says he has had hypercalcemia since 2006.  He has never had osteoporosis, urolithiasis, thyroid probs, sarcoidosis, cancer, PUD, pancreatitis, depression.  Only bony fracture was right little finger (1970's).  He does not take vitamin-A supplement.  Pt denies taking antacids, Li++, or HCTZ. He takes vit-D, 5000 units every 3 days.  He reports fatigue.   Past Medical History:  Diagnosis Date  . 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 Crohn's disease ([place])   . Deafness in right ear, wears a microphone   . Decreased peripheral vision 11/04/2011  . Essential tremor   . HLD (hyperlipidemia)   . Inguinal hernia 07/2016  . Inguinal hernia   . Retinal detachment   . Wears hearing aid, left ear     Past Surgical History:  Procedure Laterality Date  . ACOUSTIC NEUROMA RESECTION Right   . BROW LIFT AND BLEPHAROPLASTY Bilateral 10/11/2012  . CATARACT EXTRACTION W/ INTRAOCULAR LENS  IMPLANT, BILATERAL Bilateral   . HERNIA REPAIR    . INGUINAL HERNIA REPAIR Right 07/16/2016   Procedure: LAPAROSCOPIC RIGHT INGUINAL HERNIA;  Surgeon: Coralie Keens, MD;  Location: [place];  Service: General;  Laterality: Right;  . INSERTION OF MESH Right 07/16/2016   Procedure: INSERTION OF MESH;  Surgeon: Coralie Keens, MD;  Location: [place];  Service: General;  Laterality: Right;  . RETINAL DETACHMENT SURGERY Left   . TONSILLECTOMY      Social History   Socioeconomic History  . Marital status: Married    Spouse name: Not on file  . Number of children: Not on file  . Years of education: Not on file  . Highest education level: Not on file  Occupational History  . Not on file  Tobacco Use  . Smoking status: Former Smoker    Packs/day: 1.00    Years: 4.00    Pack years: 4.00    Types: Cigarettes    Quit date: 12/09/1979     Years since quitting: 40.5  . Smokeless tobacco: Never Used  . Tobacco comment: Quit 38 years ago  Vaping Use  . Vaping Use: Never used  Substance and Sexual Activity  . Alcohol use: Yes    Comment: recent ER visit fall after having a few beers  . Drug use: No  . Sexual activity: Not on file  Other Topics Concern  . Not on file  Social History Narrative  . Not on file   Social Determinants of Health   Financial Resource Strain:   . Difficulty of Paying Living Expenses:   Food Insecurity:   . Worried About Charity fundraiser in the Last Year:   . Arboriculturist in the Last Year:   Transportation Needs:   . Film/video editor (Medical):   Marland Kitchen Lack of Transportation (Non-Medical):   Physical Activity:   . Days of Exercise per Week:   . Minutes of Exercise per Session:   Stress:   . Feeling of Stress :   Social Connections:   . Frequency of Communication with Friends and Family:   . Frequency of Social Gatherings with Friends and Family:   . Attends Religious Services:   . Active Member of Clubs or Organizations:   . Attends Archivist Meetings:   Marland Kitchen Marital Status:  Intimate Partner Violence:   . Fear of Current or Ex-Partner:   . Emotionally Abused:   Marland Kitchen Physically Abused:   . Sexually Abused:     Current Outpatient Medications on File Prior to Visit  Medication Sig Dispense Refill  . Cholecalciferol (VITAMIN D) 125 MCG (5000 UT) CAPS Take 1 capsule by mouth every 3 (three) days.    Marland Kitchen glucosamine-chondroitin 500-400 MG tablet Take 1 tablet by mouth daily.     . mesalamine (LIALDA) 1.2 g EC tablet Take 2.4 g by mouth daily with breakfast.    . Multiple Vitamin (MULTIVITAMIN) tablet Take 1 tablet by mouth daily.    . Omega-3 Fatty Acids (OMEGA 3 PO) Take 2 capsules by mouth daily.    . propranolol (INDERAL) 20 MG tablet Take 2 tablets (40 mg total) by mouth daily. 180 tablet 3  . rosuvastatin (CRESTOR) 20 MG tablet TAKE 1 TABLET (20 MG TOTAL) BY MOUTH  DAILY. 90 tablet 0   No current facility-administered medications on file prior to visit.    Allergies  Allergen Reactions  . Ceftin [Cefuroxime]   . Poison Ivy Extract Rash    Family History  Problem Relation Age of Onset  . Diabetes Mother   . Dementia Mother   . Diabetes Sister   . Hyperparathyroidism Neg Hx     BP 130/80   Pulse 64   Ht 5' 9"  (1.753 m)   Wt 199 lb 12.8 oz (90.6 kg)   SpO2 97%   BMI 29.51 kg/m     Review of Systems Denies weight loss, visual loss, diarrhea, polyuria, hematuria, numbness, and back pain.       Objective:   Physical Exam VS: see vs page GEN: no distress HEAD: head: no deformity eyes: no periorbital swelling, no proptosis external nose and ears are normal NECK: supple, thyroid is not enlarged CHEST WALL: no deformity.  No kyphosis LUNGS: clear to auscultation CV: reg rate and rhythm, no murmur.  MUSCULOSKELETAL: muscle bulk and strength are grossly normal.  no obvious joint swelling.  gait is normal and steady EXTEMITIES: no deformity.  no leg edema NEURO:  cn 2-12 grossly intact.   readily moves all 4's.  sensation is intact to touch on all 4's SKIN:  Normal texture and temperature.  No rash or suspicious lesion is visible.   NODES:  None palpable at the neck PSYCH: alert, well-oriented.  Does not appear anxious nor depressed.   Lab Results  Component Value Date   PTH 103 (H) 04/20/2020   CALCIUM 10.6 (H) 04/20/2020   CAION 5.90 (H) 04/20/2020   25-OH vit-D=54  Lab Results  Component Value Date   TSH 3.03 04/17/2020   Lab Results  Component Value Date   ALT 31 04/17/2020   AST 34 04/17/2020   ALKPHOS 65 04/17/2020   BILITOT 0.4 04/17/2020    Lab Results  Component Value Date   PTH 94 (H) 06/07/2020   CALCIUM 12.1 (H) 06/07/2020   CAION 5.90 (H) 04/20/2020   I have reviewed outside records, and summarized: Pt was noted to have elevated A1c, and referred here.      Assessment & Plan:  Primary  hyperparathyroidism, new to me.  he declines ref surgery Hypercalcemia: as Ca++ is level out of proportion to PTH, we'll check for other causes.  Patient Instructions  Please reduce the vitamin-D to 1000 units per day. Blood tests are requested for you today.  We'll let you know about the results.  Let's  check the bone density. If these tests are the same, please come back for a follow-up appointment in 1 year.

## 2020-06-07 NOTE — Patient Instructions (Addendum)
Please reduce the vitamin-D to 1000 units per day. Blood tests are requested for you today.  We'll let you know about the results.  Let's check the bone density. If these tests are the same, please come back for a follow-up appointment in 1 year.

## 2020-06-13 ENCOUNTER — Other Ambulatory Visit: Payer: Self-pay | Admitting: Endocrinology

## 2020-06-13 DIAGNOSIS — E210 Primary hyperparathyroidism: Secondary | ICD-10-CM

## 2020-06-13 LAB — PROTEIN ELECTROPHORESIS, SERUM
Albumin ELP: 4.1 g/dL (ref 3.8–4.8)
Alpha 1: 0.3 g/dL (ref 0.2–0.3)
Alpha 2: 0.7 g/dL (ref 0.5–0.9)
Beta 2: 0.4 g/dL (ref 0.2–0.5)
Beta Globulin: 0.4 g/dL (ref 0.4–0.6)
Gamma Globulin: 0.6 g/dL — ABNORMAL LOW (ref 0.8–1.7)
Total Protein: 6.5 g/dL (ref 6.1–8.1)

## 2020-06-13 LAB — PTH, INTACT AND CALCIUM
Calcium: 12.1 mg/dL — ABNORMAL HIGH (ref 8.6–10.3)
PTH: 94 pg/mL — ABNORMAL HIGH (ref 14–64)

## 2020-06-13 LAB — VITAMIN A: Vitamin A (Retinoic Acid): 88 ug/dL (ref 38–98)

## 2020-06-13 LAB — VITAMIN D 1,25 DIHYDROXY
Vitamin D 1, 25 (OH)2 Total: 59 pg/mL (ref 18–72)
Vitamin D2 1, 25 (OH)2: <8 pg/mL
Vitamin D3 1, 25 (OH)2: 59 pg/mL

## 2020-06-13 LAB — PTH-RELATED PEPTIDE: PTH-Related Protein (PTH-RP): 15 pg/mL (ref 14–27)

## 2020-06-19 ENCOUNTER — Encounter: Payer: Self-pay | Admitting: Endocrinology

## 2020-06-21 ENCOUNTER — Other Ambulatory Visit: Payer: Self-pay

## 2020-06-21 ENCOUNTER — Ambulatory Visit (INDEPENDENT_AMBULATORY_CARE_PROVIDER_SITE_OTHER)
Admission: RE | Admit: 2020-06-21 | Discharge: 2020-06-21 | Disposition: A | Discharge status: Home / Self Care | Payer: PPO | Source: Ambulatory Visit | Attending: Endocrinology | Admitting: Endocrinology

## 2020-06-21 ENCOUNTER — Other Ambulatory Visit: Payer: PPO

## 2020-06-21 DIAGNOSIS — E210 Primary hyperparathyroidism: Secondary | ICD-10-CM | POA: Diagnosis not present

## 2020-06-25 ENCOUNTER — Other Ambulatory Visit: Payer: Self-pay

## 2020-06-25 ENCOUNTER — Other Ambulatory Visit: Payer: PPO

## 2020-06-25 DIAGNOSIS — E210 Primary hyperparathyroidism: Secondary | ICD-10-CM

## 2020-06-26 LAB — CALCIUM, URINE, 24 HOUR: Calcium, 24H Urine: 266 mg/24 h

## 2020-07-17 DIAGNOSIS — D225 Melanocytic nevi of trunk: Secondary | ICD-10-CM | POA: Diagnosis not present

## 2020-07-17 DIAGNOSIS — L570 Actinic keratosis: Secondary | ICD-10-CM | POA: Diagnosis not present

## 2020-07-17 DIAGNOSIS — L814 Other melanin hyperpigmentation: Secondary | ICD-10-CM | POA: Diagnosis not present

## 2020-07-17 DIAGNOSIS — L820 Inflamed seborrheic keratosis: Secondary | ICD-10-CM | POA: Diagnosis not present

## 2020-07-17 DIAGNOSIS — D485 Neoplasm of uncertain behavior of skin: Secondary | ICD-10-CM | POA: Diagnosis not present

## 2020-07-17 DIAGNOSIS — L821 Other seborrheic keratosis: Secondary | ICD-10-CM | POA: Diagnosis not present

## 2020-07-17 DIAGNOSIS — D1801 Hemangioma of skin and subcutaneous tissue: Secondary | ICD-10-CM | POA: Diagnosis not present

## 2020-08-06 MED FILL — MESALAMINE 1.2 GM TBEC: 1.2 | 60 days supply | Qty: 120 | Fill #4

## 2020-08-20 DIAGNOSIS — L988 Other specified disorders of the skin and subcutaneous tissue: Secondary | ICD-10-CM | POA: Diagnosis not present

## 2020-08-20 DIAGNOSIS — D485 Neoplasm of uncertain behavior of skin: Secondary | ICD-10-CM | POA: Diagnosis not present

## 2020-08-28 DIAGNOSIS — L738 Other specified follicular disorders: Secondary | ICD-10-CM | POA: Diagnosis not present

## 2020-08-28 DIAGNOSIS — L570 Actinic keratosis: Secondary | ICD-10-CM | POA: Diagnosis not present

## 2020-08-28 DIAGNOSIS — L820 Inflamed seborrheic keratosis: Secondary | ICD-10-CM | POA: Diagnosis not present

## 2020-09-03 MED FILL — FLUAD QUADRIVALENT 0.5 ML P: 0.5 | 1 days supply | Qty: 1 | Fill #0

## 2020-09-19 ENCOUNTER — Other Ambulatory Visit: Payer: Self-pay | Admitting: Family Medicine

## 2020-09-19 DIAGNOSIS — E785 Hyperlipidemia, unspecified: Secondary | ICD-10-CM

## 2020-09-19 MED FILL — ROSUVASTATIN CALCIUM 20 MG: 20 | 90 days supply | Qty: 90 | Fill #0

## 2020-09-19 MED FILL — PROPRANOLOL 20 MG TABLET: 20 | 90 days supply | Qty: 180 | Fill #3

## 2020-10-02 ENCOUNTER — Other Ambulatory Visit: Payer: Self-pay

## 2020-10-02 ENCOUNTER — Ambulatory Visit (INDEPENDENT_AMBULATORY_CARE_PROVIDER_SITE_OTHER): Payer: PPO | Admitting: Physician Assistant

## 2020-10-02 ENCOUNTER — Encounter: Payer: Self-pay | Admitting: Physician Assistant

## 2020-10-02 VITALS — BP 130/72 | HR 52 | Temp 97.7°F | Ht 69.0 in | Wt 204.0 lb

## 2020-10-02 DIAGNOSIS — R0602 Shortness of breath: Secondary | ICD-10-CM

## 2020-10-02 DIAGNOSIS — R600 Localized edema: Secondary | ICD-10-CM

## 2020-10-02 MED ORDER — RIVAROXABAN 15 MG PO TABS: 15.0000 mg | ORAL_TABLET | Freq: Two times a day (BID) | ORAL | 0 refills | Status: DC

## 2020-10-02 NOTE — Progress Notes (Signed)
Shaun Armstrong is a 69 y.o. male here for a new problem.  I acted as a Education administrator for [employer], PA-C Anselmo Pickler, LPN   History of Present Illness:   Chief Complaint  Patient presents with  . Leg Swelling  . Shortness of Breath    HPI   Leg swelling Drove up to [facility] (12-14 hour drive), was there for 8 days or so, and then came back Sunday. Pt c/o right leg swelling, x 4 days, painful with weight bearing. Pt has not tried any medication, just elevated. Symptoms significantly worse in AM.  May 2019 had RIGHT DVT -- took Xarelto for 3 months and then discontinued this per PCP instruction.  SOB Pt c/o SOB since on vacation last week in [place], did have a slight cough for about 12 hours, non-productive. Denies fever or chills.  When laying down at night, had to breath through his mouth. Symptoms started around the same time that his leg swelling did. Did experience worsening SOB when he came home and carried luggage upstairs, but none significant since.  Denies: chest pain, palpitations, lightheadedness, dizziness    Past Medical History:  Diagnosis Date  . 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 Crohn's disease ([place])   . Deafness in right ear, wears a microphone   . Decreased peripheral vision 11/04/2011  . Essential tremor   . HLD (hyperlipidemia)   . Inguinal hernia 07/2016  . Inguinal hernia   . Retinal detachment   . Wears hearing aid, left ear      Social History   Tobacco Use  . Smoking status: Former Smoker    Packs/day: 1.00    Years: 4.00    Pack years: 4.00    Types: Cigarettes    Quit date: 12/09/1979    Years since quitting: 40.8  . Smokeless tobacco: Never Used  . Tobacco comment: Quit 38 years ago  Vaping Use  . Vaping Use: Never used  Substance Use Topics  . Alcohol use: Yes    Comment: recent ER visit fall after having a few beers  . Drug use: No    Past Surgical History:  Procedure Laterality Date  . ACOUSTIC NEUROMA RESECTION Right   .  BROW LIFT AND BLEPHAROPLASTY Bilateral 10/11/2012  . CATARACT EXTRACTION W/ INTRAOCULAR LENS  IMPLANT, BILATERAL Bilateral   . HERNIA REPAIR    . INGUINAL HERNIA REPAIR Right 07/16/2016   Procedure: LAPAROSCOPIC RIGHT INGUINAL HERNIA;  Surgeon: Coralie Keens, MD;  Location: [place];  Service: General;  Laterality: Right;  . INSERTION OF MESH Right 07/16/2016   Procedure: INSERTION OF MESH;  Surgeon: Coralie Keens, MD;  Location: [place];  Service: General;  Laterality: Right;  . RETINAL DETACHMENT SURGERY Left   . TONSILLECTOMY      Family History  Problem Relation Age of Onset  . Diabetes Mother   . Dementia Mother   . Diabetes Sister   . Hyperparathyroidism Neg Hx     Allergies  Allergen Reactions  . Ceftin [Cefuroxime]   . Poison Ivy Extract Rash    Current Medications:   Current Outpatient Medications:  .  Cholecalciferol (VITAMIN D) 125 MCG (5000 UT) CAPS, Take 1 capsule by mouth every 3 (three) days., Disp: , Rfl:  .  glucosamine-chondroitin 500-400 MG tablet, Take 1 tablet by mouth daily. , Disp: , Rfl:  .  mesalamine (LIALDA) 1.2 g EC tablet, Take 2.4 g by mouth daily with breakfast., Disp: ,  Rfl:  .  Multiple Vitamin (MULTIVITAMIN) tablet, Take 1 tablet by mouth daily., Disp: , Rfl:  .  Omega-3 Fatty Acids (OMEGA 3 PO), Take 2 capsules by mouth daily., Disp: , Rfl:  .  propranolol (INDERAL) 20 MG tablet, Take 2 tablets (40 mg total) by mouth daily., Disp: 180 tablet, Rfl: 3 .  rosuvastatin (CRESTOR) 20 MG tablet, TAKE 1 TABLET (20 MG TOTAL) BY MOUTH DAILY. *NEED APPT*, Disp: 90 tablet, Rfl: 0 .  Rivaroxaban (XARELTO) 15 MG TABS tablet, Take 1 tablet (15 mg total) by mouth 2 (two) times daily with a meal., Disp: 14 tablet, Rfl: 0   Review of Systems:   ROS  Negative unless otherwise specified per HPI.  Vitals:   Vitals:   10/02/20 1541  BP: 130/72  Pulse: (!) 52  Temp: 97.7 F (36.5 C)  TempSrc: Temporal  SpO2: 97%   Weight: 204 lb (92.5 kg)  Height: 5' 9"  (1.753 m)     Body mass index is 30.13 kg/m.  Physical Exam:   Physical Exam Vitals and nursing note reviewed.  Constitutional:      General: He is not in acute distress.    Appearance: He is well-developed. He is not ill-appearing or toxic-appearing.  Cardiovascular:     Rate and Rhythm: Normal rate and regular rhythm.     Pulses: Normal pulses.          Dorsalis pedis pulses are 2+ on the right side and 2+ on the left side.       Posterior tibial pulses are 2+ on the right side and 2+ on the left side.     Heart sounds: Normal heart sounds, S1 normal and S2 normal.  Pulmonary:     Effort: Pulmonary effort is normal.     Breath sounds: Normal breath sounds.  Musculoskeletal:     Comments: RLE swelling, approximately 41 cm in diameter (LLE approximately 37.5 cm) No significant erythema TTP to calf area  Skin:    General: Skin is warm and dry.  Neurological:     Mental Status: He is alert.     GCS: GCS eye subscore is 4. GCS verbal subscore is 5. GCS motor subscore is 6.  Psychiatric:        Speech: Speech normal.        Behavior: Behavior normal. Behavior is cooperative.       Assessment and Plan:   Koy was seen today for leg swelling and shortness of breath.  Diagnoses and all orders for this visit:  Leg edema Reviewed case with PCP, Dr. [name]. Concerns for DVT, provoked from recent travel. Stat venous doppler U/S. Will empirically start Xarelto 15 mg BID until U/S results given prior history of DVT and suspicious presentation. Strict ER precautions advised as outlined in AVS. -     VAS [place] LOWER EXTREMITY VENOUS (DVT); Future  SOB (shortness of breath) EKG tracing is personally reviewed.  EKG notes NSR.  No acute changes.  Continue to monitor. Symptoms improving with time. If worsening, or new symptoms develop, recommend return visit for further evaluation and management. Vitals stable in office and patient is in  NAD. -     EKG 12-Lead  Other orders -     Rivaroxaban (XARELTO) 15 MG TABS tablet; Take 1 tablet (15 mg total) by mouth 2 (two) times daily with a meal.  CMA or LPN served as scribe during this visit. History, Physical, and Plan performed by medical provider. The above documentation  has been reviewed and is accurate and complete.   Inda Coke, PA-C

## 2020-10-02 NOTE — Patient Instructions (Addendum)
It was great to see you!  Start 15 mg xarelto twice daily. I will send this in if needed (we will know after the ultrasound.)  EKG today -- EKG looks good per Dr. [name].  Please get your ultrasound as scheduled -- we will call you later today or first thing tomorrow to get this scheduled.  Get help right away if: 1. You have: ? New or increased pain, swelling, warmth, or redness in an arm or leg. ? Numbness or tingling in an arm or leg. ? Shortness of breath during activity or at rest. ? A fever. ? Chest pain. ? A rapid or irregular heartbeat. ? A severe headache. ? Vision changes. ? A serious fall or accident, or you hit your head. ? Stomach (abdominal) pain. ? Blood in your vomit, stool, or urine. ? A cut that will not stop bleeding. 2. You cough up blood. 3. You feel light-headed or dizzy. 4. You cannot move your arms or legs. 5. You are confused or have memory loss.   Take care,  Inda Coke PA-C

## 2020-10-03 ENCOUNTER — Encounter: Payer: Self-pay | Admitting: Physician Assistant

## 2020-10-03 NOTE — Telephone Encounter (Signed)
Shaun Armstrong, was this not scheduled yet?

## 2020-10-04 ENCOUNTER — Other Ambulatory Visit: Payer: Self-pay

## 2020-10-04 ENCOUNTER — Ambulatory Visit (INDEPENDENT_AMBULATORY_CARE_PROVIDER_SITE_OTHER)
Admission: RE | Admit: 2020-10-04 | Discharge: 2020-10-04 | Disposition: A | Discharge status: Home / Self Care | Payer: PPO | Source: Ambulatory Visit | Attending: Physician Assistant | Admitting: Physician Assistant

## 2020-10-04 ENCOUNTER — Emergency Department (HOSPITAL_BASED_OUTPATIENT_CLINIC_OR_DEPARTMENT_OTHER): Payer: PPO

## 2020-10-04 ENCOUNTER — Encounter (HOSPITAL_BASED_OUTPATIENT_CLINIC_OR_DEPARTMENT_OTHER): Payer: Self-pay | Admitting: *Deleted

## 2020-10-04 ENCOUNTER — Inpatient Hospital Stay (HOSPITAL_BASED_OUTPATIENT_CLINIC_OR_DEPARTMENT_OTHER)
Admission: EM | Admit: 2020-10-04 | Discharge: 2020-10-07 | DRG: 176 | Disposition: A | Discharge status: Home / Self Care | Payer: PPO | Attending: Internal Medicine | Admitting: Internal Medicine

## 2020-10-04 ENCOUNTER — Encounter: Payer: Self-pay | Admitting: Physician Assistant

## 2020-10-04 ENCOUNTER — Other Ambulatory Visit: Payer: Self-pay | Admitting: *Deleted

## 2020-10-04 ENCOUNTER — Telehealth: Payer: Self-pay | Admitting: *Deleted

## 2020-10-04 DIAGNOSIS — R600 Localized edema: Secondary | ICD-10-CM

## 2020-10-04 DIAGNOSIS — K5090 Crohn's disease, unspecified, without complications: Secondary | ICD-10-CM | POA: Diagnosis not present

## 2020-10-04 DIAGNOSIS — K219 Gastro-esophageal reflux disease without esophagitis: Secondary | ICD-10-CM | POA: Diagnosis present

## 2020-10-04 DIAGNOSIS — I10 Essential (primary) hypertension: Secondary | ICD-10-CM | POA: Diagnosis present

## 2020-10-04 DIAGNOSIS — E210 Primary hyperparathyroidism: Secondary | ICD-10-CM | POA: Diagnosis not present

## 2020-10-04 DIAGNOSIS — K5080 Crohn's disease of both small and large intestine without complications: Secondary | ICD-10-CM | POA: Diagnosis not present

## 2020-10-04 DIAGNOSIS — I824Y1 Acute embolism and thrombosis of unspecified deep veins of right proximal lower extremity: Secondary | ICD-10-CM

## 2020-10-04 DIAGNOSIS — I2699 Other pulmonary embolism without acute cor pulmonale: Secondary | ICD-10-CM | POA: Diagnosis not present

## 2020-10-04 DIAGNOSIS — Z79899 Other long term (current) drug therapy: Secondary | ICD-10-CM

## 2020-10-04 DIAGNOSIS — Z833 Family history of diabetes mellitus: Secondary | ICD-10-CM

## 2020-10-04 DIAGNOSIS — Z20822 Contact with and (suspected) exposure to covid-19: Secondary | ICD-10-CM | POA: Diagnosis not present

## 2020-10-04 DIAGNOSIS — E785 Hyperlipidemia, unspecified: Secondary | ICD-10-CM | POA: Diagnosis present

## 2020-10-04 DIAGNOSIS — Z86718 Personal history of other venous thrombosis and embolism: Secondary | ICD-10-CM | POA: Diagnosis not present

## 2020-10-04 DIAGNOSIS — I82411 Acute embolism and thrombosis of right femoral vein: Secondary | ICD-10-CM | POA: Diagnosis present

## 2020-10-04 DIAGNOSIS — Z881 Allergy status to other antibiotic agents status: Secondary | ICD-10-CM | POA: Diagnosis not present

## 2020-10-04 DIAGNOSIS — R0602 Shortness of breath: Secondary | ICD-10-CM | POA: Diagnosis not present

## 2020-10-04 DIAGNOSIS — R778 Other specified abnormalities of plasma proteins: Secondary | ICD-10-CM | POA: Diagnosis present

## 2020-10-04 DIAGNOSIS — Z87891 Personal history of nicotine dependence: Secondary | ICD-10-CM | POA: Diagnosis not present

## 2020-10-04 DIAGNOSIS — E213 Hyperparathyroidism, unspecified: Secondary | ICD-10-CM | POA: Diagnosis not present

## 2020-10-04 LAB — BASIC METABOLIC PANEL
Anion gap: 11 (ref 5–15)
BUN: 20 mg/dL (ref 8–23)
CO2: 24 mmol/L (ref 22–32)
Calcium: 10.9 mg/dL — ABNORMAL HIGH (ref 8.9–10.3)
Chloride: 105 mmol/L (ref 98–111)
Creatinine, Ser: 1.21 mg/dL (ref 0.61–1.24)
GFR, Estimated: >60 mL/min (ref >60)
Glucose, Bld: 90 mg/dL (ref 70–99)
Potassium: 3.9 mmol/L (ref 3.5–5.1)
Sodium: 140 mmol/L (ref 135–145)

## 2020-10-04 LAB — CBC WITH DIFFERENTIAL/PLATELET
Abs Immature Granulocytes: 0.03 10*3/uL (ref 0.00–0.07)
Basophils Absolute: 0.1 10*3/uL (ref 0.0–0.1)
Basophils Relative: 1 %
Eosinophils Absolute: 0.3 10*3/uL (ref 0.0–0.5)
Eosinophils Relative: 4 %
HCT: 44 % (ref 39.0–52.0)
Hemoglobin: 14.4 g/dL (ref 13.0–17.0)
Immature Granulocytes: 0 %
Lymphocytes Relative: 24 %
Lymphs Abs: 1.9 10*3/uL (ref 0.7–4.0)
MCH: 29.9 pg (ref 26.0–34.0)
MCHC: 32.7 g/dL (ref 30.0–36.0)
MCV: 91.3 fL (ref 80.0–100.0)
Monocytes Absolute: 0.8 10*3/uL (ref 0.1–1.0)
Monocytes Relative: 10 %
Neutro Abs: 5 10*3/uL (ref 1.7–7.7)
Neutrophils Relative %: 61 %
Platelets: 204 10*3/uL (ref 150–400)
RBC: 4.82 MIL/uL (ref 4.22–5.81)
RDW: 12.9 % (ref 11.5–15.5)
WBC: 8.1 10*3/uL (ref 4.0–10.5)
nRBC: 0 % (ref 0.0–0.2)

## 2020-10-04 LAB — TROPONIN I (HIGH SENSITIVITY): Troponin I (High Sensitivity): 26 ng/L — ABNORMAL HIGH (ref <18)

## 2020-10-04 MED ORDER — IOHEXOL 350 MG/ML SOLN
100.0000 mL | Freq: Once | INTRAVENOUS | Status: AC | PRN
  Administered 2020-10-04: 100 mL via INTRAVENOUS

## 2020-10-04 MED ORDER — IOHEXOL 350 MG/ML SOLN: 75.0000 mL | Freq: Once | INTRAVENOUS | Status: DC | PRN

## 2020-10-04 NOTE — Telephone Encounter (Signed)
Noted  

## 2020-10-04 NOTE — Telephone Encounter (Signed)
Critical Results positive right leg DVT  Per Dr [name] patient on Xarelto  need to schedule OV with PCP.   Pt complain of SHOB Dr. [name]  instructed patient to go to the ED  Pt called stated was feeling better, and refused to go to emergency room . Patient was advise if [facility] continue needs to go.

## 2020-10-04 NOTE — Progress Notes (Signed)
  Cardiovascular Imaging at [facility].      Preliminary report of positive right leg DVT with shortness of breath given to Dr. Dimas Chyle. Patient instructed to go to the ED.   Winola Drum, RVT

## 2020-10-04 NOTE — ED Triage Notes (Signed)
Right leg pain x 4 days after driving in the car to [place]. Hx of DVT.

## 2020-10-04 NOTE — Telephone Encounter (Signed)
Left message on voicemail going to send you a MyChart message with phone number to call and schedule.

## 2020-10-04 NOTE — ED Provider Notes (Signed)
[facility] EMERGENCY DEPARTMENT Provider Note   CSN: [id number] Arrival date & time: 10/04/20  2001     History Chief Complaint  Patient presents with  . Leg Pain    Shaun Armstrong is a 69 y.o. male.  HPI Patient presents with DVT.  Recently drove to [place].  States he has had some swelling of the leg and shortness of breath.  Went to see his PCP and had an outpatient ultrasound done that showed a clot in his right leg.  No pain in the leg.  States he had shortness of breath of the last couple days but is actually much better today.  States he had trouble laying down previously.  No real chest pain.  States he had previously had a blood clot in the right knee area.  No fevers.  No coughing.  No chest pain.  States he had trouble sleeping because he had trouble laying down with the breathing.  Xarelto ordered by PCP and had been started.He has had 5 doses of it.  States breathing last night was much better than it was a night or 2 before that.    Past Medical History:  Diagnosis Date  . 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 Crohn's disease ([place])   . Deafness in right ear, wears a microphone   . Decreased peripheral vision 11/04/2011  . Essential tremor   . HLD (hyperlipidemia)   . Inguinal hernia 07/2016  . Inguinal hernia   . Retinal detachment   . Wears hearing aid, left ear     Patient Active Problem List   Diagnosis Date Noted  . Hyperparathyroidism, primary ([place]) 06/07/2020  . Hypercalcemia 06/07/2020  . Meralgia paresthetica of right side 10/25/2018  . Gastroesophageal reflux disease without esophagitis 10/25/2018  . Sun-damaged skin 08/03/2017  . Essential tremor 06/05/2016  . 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 Crohn's disease of both small and large intestine without complication ([place]) 85/01/7740  . Hyperlipidemia 10/05/2012  . Decreased peripheral vision 11/04/2011    Past Surgical History:  Procedure Laterality Date  . ACOUSTIC NEUROMA RESECTION Right   .  BROW LIFT AND BLEPHAROPLASTY Bilateral 10/11/2012  . CATARACT EXTRACTION W/ INTRAOCULAR LENS  IMPLANT, BILATERAL Bilateral   . HERNIA REPAIR    . INGUINAL HERNIA REPAIR Right 07/16/2016   Procedure: LAPAROSCOPIC RIGHT INGUINAL HERNIA;  Surgeon: Coralie Keens, MD;  Location: [facility];  Service: General;  Laterality: Right;  . INSERTION OF MESH Right 07/16/2016   Procedure: INSERTION OF MESH;  Surgeon: Coralie Keens, MD;  Location: [facility];  Service: General;  Laterality: Right;  . RETINAL DETACHMENT SURGERY Left   . TONSILLECTOMY         Family History  Problem Relation Age of Onset  . Diabetes Mother   . Dementia Mother   . Diabetes Sister   . Hyperparathyroidism Neg Hx     Social History   Tobacco Use  . Smoking status: Former Smoker    Packs/day: 1.00    Years: 4.00    Pack years: 4.00    Types: Cigarettes    Quit date: 12/09/1979    Years since quitting: 40.8  . Smokeless tobacco: Never Used  . Tobacco comment: Quit 38 years ago  Vaping Use  . Vaping Use: Never used  Substance Use Topics  . Alcohol use: Yes    Comment: recent ER visit fall after having a few beers  . Drug use: No  Home Medications Prior to Admission medications   Medication Sig Start Date End Date Taking? Authorizing Provider  Cholecalciferol (VITAMIN D) 125 MCG (5000 UT) CAPS Take 1 capsule by mouth every 3 (three) days.    [provider]  glucosamine-chondroitin 500-400 MG tablet Take 1 tablet by mouth daily.     [provider]  mesalamine (LIALDA) 1.2 g EC tablet Take 2.4 g by mouth daily with breakfast.    [provider]  Multiple Vitamin (MULTIVITAMIN) tablet Take 1 tablet by mouth daily.    [provider]  Omega-3 Fatty Acids (OMEGA 3 PO) Take 2 capsules by mouth daily.    [provider]  propranolol (INDERAL) 20 MG tablet Take 2 tablets (40 mg total) by mouth daily. 11/23/19   Vivi Barrack, MD   Rivaroxaban (XARELTO) 15 MG TABS tablet Take 1 tablet (15 mg total) by mouth 2 (two) times daily with a meal. 10/02/20   Inda Coke, PA  rosuvastatin (CRESTOR) 20 MG tablet TAKE 1 TABLET (20 MG TOTAL) BY MOUTH DAILY. *NEED APPT* 09/19/20   Vivi Barrack, MD    Allergies    Ceftin [cefuroxime] and Poison ivy extract  Review of Systems   Review of Systems  Constitutional: Negative for appetite change and fever.  HENT: Negative for dental problem.   Respiratory: Positive for chest tightness and shortness of breath.   Cardiovascular: Positive for leg swelling.  Gastrointestinal: Negative for abdominal pain.  Genitourinary: Negative for flank pain.  Musculoskeletal: Negative for back pain.  Skin: Negative for rash.  Neurological: Negative for weakness.  Psychiatric/Behavioral: Negative for confusion.    Physical Exam Updated Vital Signs BP (!) 155/90   Pulse 70   Temp 98.8 F (37.1 C) (Oral)   Resp 20   Ht 5' 9"  (1.753 m)   Wt 92.5 kg   SpO2 100%   BMI 30.11 kg/m   Physical Exam Vitals and nursing note reviewed.  HENT:     Head: Atraumatic.     Mouth/Throat:     Mouth: Mucous membranes are moist.  Eyes:     Pupils: Pupils are equal, round, and reactive to light.  Cardiovascular:     Rate and Rhythm: Normal rate and regular rhythm.  Pulmonary:     Breath sounds: No wheezing, rhonchi or rales.  Abdominal:     Tenderness: There is no abdominal tenderness.  Musculoskeletal:     Comments: Edema right lower extremity.  Skin:    General: Skin is warm.     Capillary Refill: Capillary refill takes less than 2 seconds.  Neurological:     Mental Status: He is alert and oriented to person, place, and time.  Psychiatric:        Mood and Affect: Mood normal.     ED Results / Procedures / Treatments   Labs (all labs ordered are listed, but only abnormal results are displayed) Labs Reviewed  BASIC METABOLIC PANEL - Abnormal; Notable for the following components:       Result Value   Calcium 10.9 (*)    All other components within normal limits  TROPONIN I (HIGH SENSITIVITY) - Abnormal; Notable for the following components:   Troponin I (High Sensitivity) 26 (*)    All other components within normal limits  RESP PANEL BY RT PCR (RSV, FLU A&B, COVID)  CBC WITH DIFFERENTIAL/PLATELET  TROPONIN I (HIGH SENSITIVITY)    EKG EKG Interpretation  Date/Time:  Thursday October 04 2020 23:00:55 EDT Ventricular Rate:  61 PR Interval:    QRS Duration: 133 QT Interval:  428 QTC Calculation: 432 R Axis:   -34 Text Interpretation: Sinus rhythm Left bundle branch block Baseline wander in lead(s) I II III aVR aVL aVF V3 Confirmed by Davonna Belling [phone]) on 10/04/2020 11:14:07 PM   Radiology CT Angio Chest PE W and/or Wo Contrast  Result Date: 10/04/2020 CLINICAL DATA:  Right leg pain x4 days.  History of DVT. EXAM: CT ANGIOGRAPHY CHEST WITH CONTRAST TECHNIQUE: Multidetector CT imaging of the chest was performed using the standard protocol during bolus administration of intravenous contrast. Multiplanar CT image reconstructions and MIPs were obtained to evaluate the vascular anatomy. CONTRAST:  139m OMNIPAQUE IOHEXOL 350 MG/ML SOLN COMPARISON:  None. FINDINGS: Cardiovascular: An extensive amount of intraluminal low attenuation is seen involving the bilateral pulmonary arteries as well as numerous bilateral upper lobe, right middle lobe and bilateral lower lobe branches. There is no evidence of saddle embolus. Normal heart size without evidence of heart strain. No pericardial effusion. Mediastinum/Nodes: No enlarged mediastinal, hilar, or axillary lymph nodes. Thyroid gland, trachea, and esophagus demonstrate no significant findings. Lungs/Pleura: Lungs are clear. No pleural effusion or pneumothorax. Upper Abdomen: No acute abnormality. Musculoskeletal: No chest wall abnormality. No acute or significant osseous findings. Review of the MIP images confirms the above  findings. IMPRESSION: Extensive bilateral pulmonary embolism, without evidence of saddle embolus or heart strain. Electronically Signed   By: TVirgina NorfolkM.D.   On: 10/04/2020 23:31   VAS [place]OWER EXTREMITY VENOUS (DVT)  Result Date: 10/04/2020  Lower Venous DVTStudy Indications: DVT, and SOB.  Risk Factors: DVT 2019 right distal FA, popliteal v. Performing Technologist: HRalene CorkRVT  Examination Guidelines: A complete evaluation includes B-mode imaging, spectral Doppler, color Doppler, and power Doppler as needed of all accessible portions of each vessel. Bilateral testing is considered an integral part of a complete examination. Limited examinations for reoccurring indications may be performed as noted. The reflux portion of the exam is performed with the patient in reverse Trendelenburg.  +---------+---------------+---------+-----------+----------+-----------------+ RIGHT    CompressibilityPhasicitySpontaneityPropertiesThrombus Aging    +---------+---------------+---------+-----------+----------+-----------------+ CFV      Partial        No       Yes                  Acute             +---------+---------------+---------+-----------+----------+-----------------+ SFJ      Full                    Yes                                    +---------+---------------+---------+-----------+----------+-----------------+ FV Prox  None           No       No                   Acute             +---------+---------------+---------+-----------+----------+-----------------+ FV Mid   None           No       No                   Acute             +---------+---------------+---------+-----------+----------+-----------------+ FV DistalNone           No  No                   Acute             +---------+---------------+---------+-----------+----------+-----------------+ POP      Partial        Yes      Yes                  Age Indeterminate  +---------+---------------+---------+-----------+----------+-----------------+ PTV      Full                    Yes                                    +---------+---------------+---------+-----------+----------+-----------------+ PERO     Full                    Yes                                    +---------+---------------+---------+-----------+----------+-----------------+ Gastroc  None                    No                                     +---------+---------------+---------+-----------+----------+-----------------+ GSV      Full           Yes      Yes                  Acute             +---------+---------------+---------+-----------+----------+-----------------+  Findings reported to Dr [name] via [place] at 4:37 pm.  Summary: RIGHT: - There is no evidence of superficial venous thrombosis. - Deep vein thrombosis in the CFV, FV, and gastrocnemious veins. - Common femoral vein obstruction doesn't appear to extend above inguinal ligament.   *See table(s) above for measurements and observations.    Preliminary     Procedures Procedures (including critical care time)  Medications Ordered in ED Medications  iohexol (OMNIPAQUE) 350 MG/ML injection 100 mL (100 mLs Intravenous Contrast Given 10/04/20 2320)    ED Course  I have reviewed the triage vital signs and the nursing notes.  Pertinent labs & imaging results that were available during my care of the patient were reviewed by me and considered in my medical decision making (see chart for details).    MDM Rules/Calculators/A&P                          Patient with leg pain and known DVT on outpatient ultrasound.  However has had severe shortness of breath.  Worse 2 days ago and improved today but had been rather bad 2 days ago.  Does have elevated troponin.  Has had 5 doses of Xarelto.  CT scan does show large clot however.  With elevated troponin and large clot on CTA feels the patient benefit from admission to  the hospital.  Will likely need echocardiogram and potentially more anticoagulation.  Will discuss with hospitalist.  I reviewed imaging blood work and previous records.  CRITICAL CARE Performed by: Davonna Belling Total critical care time: 30 minutes Critical care time was exclusive of separately billable procedures  and treating other patients. Critical care was necessary to treat or prevent imminent or life-threatening deterioration. Critical care was time spent personally by me on the following activities: development of treatment plan with patient and/or surrogate as well as nursing, discussions with consultants, evaluation of patient's response to treatment, examination of patient, obtaining history from patient or surrogate, ordering and performing treatments and interventions, ordering and review of laboratory studies, ordering and review of radiographic studies, pulse oximetry and re-evaluation of patient's condition.  Final Clinical Impression(s) / ED Diagnoses Final diagnoses:  Acute deep vein thrombosis (DVT) of proximal vein of right lower extremity (HCC)  Acute pulmonary embolism, unspecified pulmonary embolism type, unspecified whether acute cor pulmonale present [facility])    Rx / DC Orders ED Discharge Orders    None       Davonna Belling, MD 10/04/20 2359

## 2020-10-05 ENCOUNTER — Other Ambulatory Visit: Payer: Self-pay | Admitting: *Deleted

## 2020-10-05 ENCOUNTER — Ambulatory Visit: Payer: PPO

## 2020-10-05 ENCOUNTER — Inpatient Hospital Stay (HOSPITAL_COMMUNITY): Payer: PPO

## 2020-10-05 DIAGNOSIS — Z20822 Contact with and (suspected) exposure to covid-19: Secondary | ICD-10-CM | POA: Diagnosis present

## 2020-10-05 DIAGNOSIS — K5090 Crohn's disease, unspecified, without complications: Secondary | ICD-10-CM | POA: Diagnosis not present

## 2020-10-05 DIAGNOSIS — I824Y1 Acute embolism and thrombosis of unspecified deep veins of right proximal lower extremity: Secondary | ICD-10-CM | POA: Diagnosis not present

## 2020-10-05 DIAGNOSIS — Z86718 Personal history of other venous thrombosis and embolism: Secondary | ICD-10-CM | POA: Diagnosis not present

## 2020-10-05 DIAGNOSIS — Z79899 Other long term (current) drug therapy: Secondary | ICD-10-CM | POA: Diagnosis not present

## 2020-10-05 DIAGNOSIS — K5080 Crohn's disease of both small and large intestine without complications: Secondary | ICD-10-CM | POA: Diagnosis present

## 2020-10-05 DIAGNOSIS — I10 Essential (primary) hypertension: Secondary | ICD-10-CM | POA: Diagnosis present

## 2020-10-05 DIAGNOSIS — I82411 Acute embolism and thrombosis of right femoral vein: Secondary | ICD-10-CM | POA: Diagnosis present

## 2020-10-05 DIAGNOSIS — E785 Hyperlipidemia, unspecified: Secondary | ICD-10-CM

## 2020-10-05 DIAGNOSIS — I2699 Other pulmonary embolism without acute cor pulmonale: Secondary | ICD-10-CM

## 2020-10-05 DIAGNOSIS — E210 Primary hyperparathyroidism: Secondary | ICD-10-CM | POA: Diagnosis present

## 2020-10-05 DIAGNOSIS — R0602 Shortness of breath: Secondary | ICD-10-CM | POA: Diagnosis not present

## 2020-10-05 DIAGNOSIS — Z881 Allergy status to other antibiotic agents status: Secondary | ICD-10-CM | POA: Diagnosis not present

## 2020-10-05 DIAGNOSIS — Z833 Family history of diabetes mellitus: Secondary | ICD-10-CM | POA: Diagnosis not present

## 2020-10-05 DIAGNOSIS — E213 Hyperparathyroidism, unspecified: Secondary | ICD-10-CM

## 2020-10-05 DIAGNOSIS — R778 Other specified abnormalities of plasma proteins: Secondary | ICD-10-CM | POA: Diagnosis present

## 2020-10-05 DIAGNOSIS — Z87891 Personal history of nicotine dependence: Secondary | ICD-10-CM | POA: Diagnosis not present

## 2020-10-05 DIAGNOSIS — K219 Gastro-esophageal reflux disease without esophagitis: Secondary | ICD-10-CM

## 2020-10-05 HISTORY — DX: Other pulmonary embolism without acute cor pulmonale: I26.99

## 2020-10-05 HISTORY — DX: Acute embolism and thrombosis of unspecified deep veins of right proximal lower extremity: I82.4Y1

## 2020-10-05 LAB — RESP PANEL BY RT PCR (RSV, FLU A&B, COVID)
Influenza A by PCR: NEGATIVE
Influenza B by PCR: NEGATIVE
Respiratory Syncytial Virus by PCR: NEGATIVE
SARS Coronavirus 2 by RT PCR: NEGATIVE

## 2020-10-05 LAB — ECHOCARDIOGRAM COMPLETE
Area-P 1/2: 3.53 cm2
Calc EF: 48.6 %
Height: 69 in
P 1/2 time: 1128 msec
S' Lateral: 4.4 cm
Single Plane A2C EF: 52.3 %
Single Plane A4C EF: 48.1 %
Weight: 3243.2 oz

## 2020-10-05 LAB — HEPARIN LEVEL (UNFRACTIONATED): Heparin Unfractionated: >2.2 IU/mL — ABNORMAL HIGH (ref 0.30–0.70)

## 2020-10-05 LAB — HIV ANTIBODY (ROUTINE TESTING W REFLEX): HIV Screen 4th Generation wRfx: NONREACTIVE

## 2020-10-05 LAB — APTT: aPTT: 170 seconds (ref 24–36)

## 2020-10-05 LAB — TROPONIN I (HIGH SENSITIVITY): Troponin I (High Sensitivity): 24 ng/L — ABNORMAL HIGH

## 2020-10-05 MED ORDER — HEPARIN (PORCINE) 25000 UT/250ML-% IV SOLN
12.0000 [IU]/kg/h | INTRAVENOUS | Status: DC
Start: 2020-10-05 — End: 2020-10-05

## 2020-10-05 MED ORDER — ROSUVASTATIN CALCIUM 20 MG PO TABS
20.0000 mg | ORAL_TABLET | Freq: Every day | ORAL | Status: DC
  Administered 2020-10-05 – 2020-10-07 (×3): 20 mg via ORAL
  Filled 2020-10-05 (×3): qty 1

## 2020-10-05 MED ORDER — RIVAROXABAN 20 MG PO TABS: 20.0000 mg | ORAL_TABLET | Freq: Every day | ORAL | 0 refills | Status: DC

## 2020-10-05 MED ORDER — HEPARIN (PORCINE) 25000 UT/250ML-% IV SOLN
1500.0000 [IU]/h | INTRAVENOUS | Status: DC
  Administered 2020-10-05: 1500 [IU]/h via INTRAVENOUS
  Filled 2020-10-05: qty 250

## 2020-10-05 MED ORDER — ACETAMINOPHEN 650 MG RE SUPP: 650.0000 mg | Freq: Four times a day (QID) | RECTAL | Status: DC | PRN

## 2020-10-05 MED ORDER — RIVAROXABAN 15 MG PO TABS: ORAL_TABLET | ORAL | 0 refills | Status: DC

## 2020-10-05 MED ORDER — TRAZODONE HCL 50 MG PO TABS: 25.0000 mg | ORAL_TABLET | Freq: Every evening | ORAL | Status: DC | PRN

## 2020-10-05 MED ORDER — METOPROLOL TARTRATE 5 MG/5ML IV SOLN: 5.0000 mg | Freq: Four times a day (QID) | INTRAVENOUS | Status: DC | PRN

## 2020-10-05 MED ORDER — POLYETHYLENE GLYCOL 3350 17 G PO PACK: 17.0000 g | PACK | Freq: Every day | ORAL | Status: DC | PRN

## 2020-10-05 MED ORDER — HEPARIN (PORCINE) 25000 UT/250ML-% IV SOLN
1150.0000 [IU]/h | INTRAVENOUS | Status: AC
  Administered 2020-10-05 (×2): 1300 [IU]/h via INTRAVENOUS
  Filled 2020-10-05 (×2): qty 250

## 2020-10-05 MED ORDER — SODIUM CHLORIDE 0.9% FLUSH: 3.0000 mL | Freq: Two times a day (BID) | INTRAVENOUS | Status: DC

## 2020-10-05 MED ORDER — SODIUM CHLORIDE 0.9% FLUSH: 3.0000 mL | INTRAVENOUS | Status: DC | PRN

## 2020-10-05 MED ORDER — GLUCOSAMINE-CHONDROITIN 500-400 MG PO TABS: 1.0000 | ORAL_TABLET | Freq: Every day | ORAL | Status: DC

## 2020-10-05 MED ORDER — SODIUM CHLORIDE 0.9 % IV SOLN: 250.0000 mL | INTRAVENOUS | Status: DC | PRN

## 2020-10-05 MED ORDER — PROPRANOLOL HCL 40 MG PO TABS
40.0000 mg | ORAL_TABLET | Freq: Every day | ORAL | Status: DC
  Administered 2020-10-05: 40 mg via ORAL
  Filled 2020-10-05 (×3): qty 1

## 2020-10-05 MED ORDER — ACETAMINOPHEN 325 MG PO TABS: 650.0000 mg | ORAL_TABLET | Freq: Four times a day (QID) | ORAL | Status: DC | PRN

## 2020-10-05 MED ORDER — MESALAMINE 1.2 G PO TBEC
2.4000 g | DELAYED_RELEASE_TABLET | Freq: Every day | ORAL | Status: DC
  Administered 2020-10-05 – 2020-10-07 (×3): 2.4 g via ORAL
  Filled 2020-10-05 (×4): qty 2

## 2020-10-05 NOTE — Progress Notes (Signed)
ANTICOAGULATION CONSULT NOTE - Initial Consult  Pharmacy Consult for Heparin Indication: PE and DVT  Allergies  Allergen Reactions  . Ceftin [Cefuroxime]   . Poison Ivy Extract Rash    Patient Measurements: Height: 5' 9"  (175.3 cm) Weight: 92.5 kg (203 lb 14.8 oz) IBW/kg (Calculated) : 70.7 Heparin Dosing Weight: 92 kg  Vital Signs: Temp: 98.8 F (37.1 C) (10/28 2008) Temp Source: Oral (10/28 2008) BP: 105/60 (10/29 0200) Pulse Rate: 49 (10/29 0200)  Labs: Recent Labs    10/04/20 2142 10/04/20 2354  HGB 14.4  --   HCT 44.0  --   PLT 204  --   CREATININE 1.21  --   TROPONINIHS 26* 24*    Estimated Creatinine Clearance: 63.8 mL/min (by C-G formula based on SCr of 1.21 mg/dL).   Medical History: Past Medical History:  Diagnosis Date  . 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 Crohn's disease ([place])   . Deafness in right ear, wears a microphone   . Decreased peripheral vision 11/04/2011  . Essential tremor   . HLD (hyperlipidemia)   . Inguinal hernia 07/2016  . Inguinal hernia   . Retinal detachment   . Wears hearing aid, left ear     Medications:  See electronic med rec  Assessment: 69 y.o. M presents with DVT - diagnosed a few days ago at PCP - started on Xarelto 10/26 - last dose taken 10/28 ~1430. Pt now at [facility] with SOB over past 2 days. CT shows extensive b/l PE. To begin heparin. Xarelto likely affecting heparin levels so will utilize PTT for monitoring.  Goal of Therapy:  Heparin level 0.3-0.7 units/ml; PTT 66-102 sec Monitor platelets by anticoagulation protocol: Yes   Plan:  Heparin gtt at 1500 units/hr. No bolus. Will f/u heparin level and PTT in 8 hours Daily heparin level and CBC  Sherlon Handing, PharmD, BCPS Please see amion for complete clinical pharmacist phone list 10/05/2020,2:39 AM

## 2020-10-05 NOTE — Progress Notes (Signed)
Pt arrived from [facility] via [place]. VS obtained, pt oriented to room, with call bell in reach. TRH Admitting paged to Day RN, Michelle's, phone.

## 2020-10-05 NOTE — Progress Notes (Signed)
  Echocardiogram 2D Echocardiogram has been performed.  Shaun Armstrong 10/05/2020, 11:31 AM

## 2020-10-05 NOTE — ED Notes (Signed)
Pt departs ED with [facility] at this time.

## 2020-10-05 NOTE — ED Notes (Signed)
Assumed care of patient from [place], [place]

## 2020-10-05 NOTE — H&P (Addendum)
History and Physical    VAISHNAV DEMARTIN OYD:[id number] DOB: 07-Aug-1951 DOA: 10/04/2020  PCP: Vivi Barrack, MD  Patient coming from: Home  I have personally briefly reviewed patient's old medical records in [place]  Chief Complaint: Leg pain and shortness of breath  HPI: Shaun Armstrong is a 69 y.o. male with medical history significant of Crohn's disease, hyperparathyroidism, GERD, hyperlipidemia, elevated calcium, and previous DVT approximately 2 years ago that was unprovoked.  He is transferred from the [facility] for acute PE.  He and his wife took a trip to [place] starting on October 4 and took their time going up driving approximately 4 hours/day staying in hotels when they drove home they did in 2 days.  During the day of arrival at home he developed leg swelling and pain as well as some shortness of breath.  He reports trying to take to suitcases into the house and having really increased work of breathing.  He called his primary care physician and was seen there with concern for DVT and they ordered a stat lower extremity ultrasound and started him on Xarelto 15 mg twice daily at that time.  For some reason this was not done for a couple of days and then he was found to have a DVT in the right common femoral vein, femoral vein, gastrocnemius veins.  At that time he was told to go to the ER, and he eventually made his way to the [facility].  Imaging at the med center revealed a PE as well.  The patient reports clinical improvement every day since his arrival back home.  He reports his pain is much less and his breathing feels improved.  He does feel weak and fatigued. ED Course: Found to have extensive bilateral pulmonary embolism without evidence of saddle or heart strain.  He was started on heparin drip.  He was found to have mildly elevated troponins which were flat.  Review of Systems: As per HPI otherwise 10 point review of systems negative.   Past Medical  History:  Diagnosis Date  . 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 Crohn's disease ([place])   . Deafness in right ear, wears a microphone   . Decreased peripheral vision 11/04/2011  . Essential tremor   . HLD (hyperlipidemia)   . Inguinal hernia 07/2016  . Inguinal hernia   . Retinal detachment   . Wears hearing aid, left ear     Past Surgical History:  Procedure Laterality Date  . ACOUSTIC NEUROMA RESECTION Right   . BROW LIFT AND BLEPHAROPLASTY Bilateral 10/11/2012  . CATARACT EXTRACTION W/ INTRAOCULAR LENS  IMPLANT, BILATERAL Bilateral   . HERNIA REPAIR    . INGUINAL HERNIA REPAIR Right 07/16/2016   Procedure: LAPAROSCOPIC RIGHT INGUINAL HERNIA;  Surgeon: Coralie Keens, MD;  Location: [facility];  Service: General;  Laterality: Right;  . INSERTION OF MESH Right 07/16/2016   Procedure: INSERTION OF MESH;  Surgeon: Coralie Keens, MD;  Location: [facility];  Service: General;  Laterality: Right;  . RETINAL DETACHMENT SURGERY Left   . TONSILLECTOMY       reports that he quit smoking about 40 years ago. His smoking use included cigarettes. He has a 4.00 pack-year smoking history. He has never used smokeless tobacco. He reports current alcohol use. He reports that he does not use drugs.  Allergies  Allergen Reactions  . Ceftin [Cefuroxime]   . Poison Ivy Extract Rash  Family History  Problem Relation Age of Onset  . Diabetes Mother   . Dementia Mother   . Diabetes Sister   . Hyperparathyroidism Neg Hx     Prior to Admission medications   Medication Sig Start Date End Date Taking? Authorizing Provider  Cholecalciferol (VITAMIN D) 125 MCG (5000 UT) CAPS Take 1 capsule by mouth every 3 (three) days.    [provider]  glucosamine-chondroitin 500-400 MG tablet Take 1 tablet by mouth daily.     [provider]  mesalamine (LIALDA) 1.2 g EC tablet Take 2.4 g by mouth daily with breakfast.    [provider]  Multiple Vitamin  (MULTIVITAMIN) tablet Take 1 tablet by mouth daily.    [provider]  Omega-3 Fatty Acids (OMEGA 3 PO) Take 2 capsules by mouth daily.    [provider]  propranolol (INDERAL) 20 MG tablet Take 2 tablets (40 mg total) by mouth daily. 11/23/19   Vivi Barrack, MD  Rivaroxaban (XARELTO) 15 MG TABS tablet Take 1 tablet (15 mg total) by mouth 2 (two) times daily with a meal. 10/02/20   Inda Coke, PA  rosuvastatin (CRESTOR) 20 MG tablet TAKE 1 TABLET (20 MG TOTAL) BY MOUTH DAILY. *NEED APPT* 09/19/20   Vivi Barrack, MD    Physical Exam: Vitals:   10/05/20 0500 10/05/20 0600 10/05/20 0652 10/05/20 0808  BP: 90/62 105/62 134/83 115/73  Pulse: (!) 51 (!) 50 66 (!) 57  Resp: 16 18 13 18   Temp:   98 F (36.7 C) 98.3 F (36.8 C)  TempSrc:   Oral Oral  SpO2: 95% 96% 100% 96%  Weight:   91.9 kg   Height:        Constitutional: NAD, calm, comfortable Eyes:  lids and conjunctivae normal ENMT: Mucous membranes are moist. Normal dentition.  Neck: normal, supple, no masses, no thyromegaly Respiratory: clear to auscultation bilaterally, no wheezing, no crackles. Normal respiratory effort. No accessory muscle use.  Cardiovascular: Regular rate and rhythm, no murmurs / rubs / gallops.  Right lower extremity edema, with mild color change, 2+ pedal pulses. No carotid bruits.  Abdomen: no tenderness, no masses palpated. No hepatosplenomegaly. Bowel sounds positive.  Musculoskeletal: no clubbing / cyanosis. No joint deformity upper and lower extremities. Good ROM, no contractures. Normal muscle tone.  Skin: no rashes, lesions, ulcers. No induration Neurologic: CN 2-12 grossly intact. Sensation intact Psychiatric: Normal judgment and insight. Alert and oriented x 3. Normal mood.   Labs on Admission: I have personally reviewed following labs and imaging studies  CBC: Recent Labs  Lab 10/04/20 2142  WBC 8.1  NEUTROABS 5.0  HGB 14.4  HCT 44.0  MCV 91.3  PLT 161    Basic Metabolic Panel: Recent Labs  Lab 10/04/20 2142  NA 140  K 3.9  CL 105  CO2 24  GLUCOSE 90  BUN 20  CREATININE 1.21  CALCIUM 10.9*   GFR: Estimated Creatinine Clearance: 63.6 mL/min (by C-G formula based on SCr of 1.21 mg/dL).  Radiological Exams on Admission: CT Angio Chest PE W and/or Wo Contrast  Addendum Date: 10/05/2020   ADDENDUM REPORT: 10/05/2020 03:45 ADDENDUM: Results were discussed with Dr. Alvino Chapel at 11:47 p.m. [place] on October 04, 2020. Electronically Signed   By: Virgina Norfolk M.D.   On: 10/05/2020 03:45   Result Date: 10/05/2020 CLINICAL DATA:  Right leg pain x4 days.  History of DVT. EXAM: CT ANGIOGRAPHY CHEST WITH CONTRAST TECHNIQUE: Multidetector CT imaging of the chest  was performed using the standard protocol during bolus administration of intravenous contrast. Multiplanar CT image reconstructions and MIPs were obtained to evaluate the vascular anatomy. CONTRAST:  159m OMNIPAQUE IOHEXOL 350 MG/ML SOLN COMPARISON:  None. FINDINGS: Cardiovascular: An extensive amount of intraluminal low attenuation is seen involving the bilateral pulmonary arteries as well as numerous bilateral upper lobe, right middle lobe and bilateral lower lobe branches. There is no evidence of saddle embolus. Normal heart size without evidence of heart strain. No pericardial effusion. Mediastinum/Nodes: No enlarged mediastinal, hilar, or axillary lymph nodes. Thyroid gland, trachea, and esophagus demonstrate no significant findings. Lungs/Pleura: Lungs are clear. No pleural effusion or pneumothorax. Upper Abdomen: No acute abnormality. Musculoskeletal: No chest wall abnormality. No acute or significant osseous findings. Review of the MIP images confirms the above findings. IMPRESSION: Extensive bilateral pulmonary embolism, without evidence of saddle embolus or heart strain. Electronically Signed: By: TVirgina NorfolkM.D. On: 10/04/2020 23:31   VAS [place]OWER EXTREMITY VENOUS (DVT)   Result Date: 10/04/2020  Lower Venous DVTStudy Indications: DVT, and SOB.  Risk Factors: DVT 2019 right distal FA, popliteal v. Performing Technologist: HRalene CorkRVT  Examination Guidelines: A complete evaluation includes B-mode imaging, spectral Doppler, color Doppler, and power Doppler as needed of all accessible portions of each vessel. Bilateral testing is considered an integral part of a complete examination. Limited examinations for reoccurring indications may be performed as noted. The reflux portion of the exam is performed with the patient in reverse Trendelenburg.  +---------+---------------+---------+-----------+----------+-----------------+ RIGHT    CompressibilityPhasicitySpontaneityPropertiesThrombus Aging    +---------+---------------+---------+-----------+----------+-----------------+ CFV      Partial        No       Yes                  Acute             +---------+---------------+---------+-----------+----------+-----------------+ SFJ      Full                    Yes                                    +---------+---------------+---------+-----------+----------+-----------------+ FV Prox  None           No       No                   Acute             +---------+---------------+---------+-----------+----------+-----------------+ FV Mid   None           No       No                   Acute             +---------+---------------+---------+-----------+----------+-----------------+ FV DistalNone           No       No                   Acute             +---------+---------------+---------+-----------+----------+-----------------+ POP      Partial        Yes      Yes                  Age Indeterminate +---------+---------------+---------+-----------+----------+-----------------+ PTV      Full  Yes                                    +---------+---------------+---------+-----------+----------+-----------------+ PERO     Full                     Yes                                    +---------+---------------+---------+-----------+----------+-----------------+ Gastroc  None                    No                                     +---------+---------------+---------+-----------+----------+-----------------+ GSV      Full           Yes      Yes                  Acute             +---------+---------------+---------+-----------+----------+-----------------+  Findings reported to Dr [name] via [place] at 4:37 pm.  Summary: RIGHT: - There is no evidence of superficial venous thrombosis. - Deep vein thrombosis in the CFV, FV, and gastrocnemious veins. - Common femoral vein obstruction doesn't appear to extend above inguinal ligament.   *See table(s) above for measurements and observations.    Preliminary     EKG: Independently reviewed.  Shows sinus rhythm, left bundle branch block, wandering baseline.  Assessment/Plan Principal Problem:   Bilateral pulmonary embolism (HCC) Active Problems:   Crohn's disease of both small and large intestine without complication (HCC)   Hyperlipidemia   Gastroesophageal reflux disease without esophagitis   Hyperparathyroidism, primary (HCC)   Hypercalcemia   Acute deep vein thrombosis (DVT) of proximal vein of right lower extremity ([place])   Pulmonary embolism (HCC)  Bilateral pulmonary embolism Patient is anxious to go home, however given how large the PE is, will await cardiac echo to rule out right heart strain.  Continue heparin GTT at least until tomorrow morning. Currently patient is normotensive, his heart rate is in the 50s, his respiratory rate is 18, and his oxygen saturation is 93 to 100% on room air.  Might consider thrombolysis should anything become less stable. If remains stable would consider home on Xarelto following an a.m. dose and transition off of heparin drip.  Acute DVT Appears provoked from recent travel.  Will need to continue anticoagulation.   This is a second DVT.  Would consider for extended or even lifelong anticoagulation.  I do not see that he has had a full anticoagulation work-up.  He does not note any family history of DVT or PE.  Crohn's disease Continue mesalamine  Hyperlipidemia Continue Crestor  GERD Dietary treatment only  Hyperparathyroidism Currently on no treatment for this, has seen endocrinologist, continued outpatient follow-up.  Hypercalcemia Due to hyperparathyroidism no symptoms at present.  DVT prophylaxis: XQ:JJHERDE GTT Code Status: Full code  Family Communication: Patient at bedside Disposition Plan: Home in 1 to 2 days Consults called: None Admission status: Inpatient patient placed in inpatient status as he will likely cross 2 midnights into tomorrow.  Additionally he is on IV heparin drip.  Continued work-up prohibits discharge at this time.   Donnamae Jude  MD [facility] [phone]  If 7PM-7AM, please contact night-coverage 10/05/2020, 8:22 AM

## 2020-10-05 NOTE — Progress Notes (Signed)
[facility] for Heparin Indication: PE and DVT  Allergies  Allergen Reactions  . Ceftin [Cefuroxime] Other (See Comments)    Severe dehydration  . Poison Ivy Extract Rash    Patient Measurements: Height: 5' 9"  (175.3 cm) Weight: 91.9 kg (202 lb 11.2 oz) IBW/kg (Calculated) : 70.7 Heparin Dosing Weight: 92 kg  Vital Signs: Temp: 98.3 F (36.8 C) (10/29 0808) Temp Source: Oral (10/29 0808) BP: 115/73 (10/29 0808) Pulse Rate: 57 (10/29 0808)  Labs: Recent Labs    10/04/20 2142 10/04/20 2354 10/05/20 1026  HGB 14.4  --   --   HCT 44.0  --   --   PLT 204  --   --   APTT  --   --  170*  HEPARINUNFRC  --   --  >2.20*  CREATININE 1.21  --   --   TROPONINIHS 26* 24*  --     Estimated Creatinine Clearance: 63.6 mL/min (by C-G formula based on SCr of 1.21 mg/dL).   Medical History: Past Medical History:  Diagnosis Date  . 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 Crohn's disease ([place])   . Deafness in right ear, wears a microphone   . Decreased peripheral vision 11/04/2011  . Essential tremor   . HLD (hyperlipidemia)   . Inguinal hernia 07/2016  . Inguinal hernia   . Retinal detachment   . Wears hearing aid, left ear      Assessment: 69 y.o. M presents with DVT - diagnosed a few days ago at PCP - started on Xarelto 10/26 - last dose taken 10/28 ~1430. Pt now at [facility] with SOB over past 2 days. CT shows extensive b/l now on heparin. Xarelto likely affecting heparin levels so will utilize PTT for monitoring. -aPTT= 170  Goal of Therapy:  Heparin level 0.3-0.7 units/ml; PTT 66-102 sec Monitor platelets by anticoagulation protocol: Yes   Plan:  --hold heparin for 30 minutes then decrease to 1300 units/hr -Heparin level in 6 hours and daily wth CBC daily  Hildred Laser, PharmD Clinical Pharmacist **Pharmacist phone directory can now be found on [email or web address] ([email or web address]).  Listed under [place].

## 2020-10-05 NOTE — Telephone Encounter (Signed)
Patient currently admitted with PE.  Shaun Armstrong. [name], MD 10/05/2020 9:31 AM

## 2020-10-06 LAB — CBC
HCT: 45.7 % (ref 39.0–52.0)
Hemoglobin: 14.7 g/dL (ref 13.0–17.0)
MCH: 29.7 pg (ref 26.0–34.0)
MCHC: 32.2 g/dL (ref 30.0–36.0)
MCV: 92.3 fL (ref 80.0–100.0)
Platelets: 259 10*3/uL (ref 150–400)
RBC: 4.95 MIL/uL (ref 4.22–5.81)
RDW: 13.2 % (ref 11.5–15.5)
WBC: 7.7 10*3/uL (ref 4.0–10.5)
nRBC: 0 % (ref 0.0–0.2)

## 2020-10-06 LAB — HEPARIN LEVEL (UNFRACTIONATED)
Heparin Unfractionated: 0.87 IU/mL — ABNORMAL HIGH (ref 0.30–0.70)
Heparin Unfractionated: 0.72 IU/mL — ABNORMAL HIGH (ref 0.30–0.70)

## 2020-10-06 LAB — APTT
aPTT: 158 seconds — ABNORMAL HIGH (ref 24–36)
aPTT: 59 seconds — ABNORMAL HIGH (ref 24–36)
aPTT: 65 seconds — ABNORMAL HIGH (ref 24–36)

## 2020-10-06 LAB — TSH: TSH: 3.99 u[IU]/mL (ref 0.350–4.500)

## 2020-10-06 LAB — D-DIMER, QUANTITATIVE: D-Dimer, Quant: 2.79 ug/mL-FEU — ABNORMAL HIGH (ref 0.00–0.50)

## 2020-10-06 LAB — MAGNESIUM: Magnesium: 2.3 mg/dL (ref 1.7–2.4)

## 2020-10-06 MED ORDER — PROPRANOLOL HCL 10 MG PO TABS
10.0000 mg | ORAL_TABLET | Freq: Every day | ORAL | Status: DC
  Administered 2020-10-07: 10 mg via ORAL
  Filled 2020-10-06: qty 1

## 2020-10-06 MED ORDER — RIVAROXABAN 15 MG PO TABS
15.0000 mg | ORAL_TABLET | Freq: Two times a day (BID) | ORAL | Status: DC
  Administered 2020-10-07: 15 mg via ORAL
  Filled 2020-10-06: qty 1

## 2020-10-06 MED ORDER — RIVAROXABAN 20 MG PO TABS: 20.0000 mg | ORAL_TABLET | Freq: Every day | ORAL | Status: DC

## 2020-10-06 NOTE — Plan of Care (Signed)
  Problem: Health Behavior/Discharge Planning: Goal: Ability to manage health-related needs will improve Outcome: Progressing   Problem: Clinical Measurements: Goal: Diagnostic test results will improve Outcome: Progressing Goal: Respiratory complications will improve Outcome: Progressing Goal: Cardiovascular complication will be avoided Outcome: Progressing

## 2020-10-06 NOTE — Progress Notes (Signed)
ANTICOAGULATION CONSULT NOTE - Follow Up Consult  Pharmacy Consult for heparin Indication: PE/DVT  Labs: Recent Labs    10/04/20 2142 10/04/20 2354 10/05/20 1026 10/05/20 2318  HGB 14.4  --   --   --   HCT 44.0  --   --   --   PLT 204  --   --   --   APTT  --   --  170* 158*  HEPARINUNFRC  --   --  >2.20*  --   CREATININE 1.21  --   --   --   TROPONINIHS 26* 24*  --   --     Assessment: 69yo male remains supratherapeutic on heparin after rate change; no gtt issues or signs of bleeding per RN.  Goal of Therapy:  aPTT 66-102 seconds   Plan:  Will decrease heparin gtt by 3 units/kg/hr to 1000 units/hr and check PTT in 6 hours.    Wynona Neat, PharmD, BCPS  10/06/2020,1:06 AM

## 2020-10-06 NOTE — Progress Notes (Addendum)
ANTICOAGULATION CONSULT NOTE - Follow Up Consult  Pharmacy Consult for IV heparin and Xarelto Indication: DVT and PE  Allergies  Allergen Reactions  . Ceftin [Cefuroxime] Other (See Comments)    Severe dehydration  . Poison Ivy Extract Rash    Patient Measurements: Height: 5' 9"  (175.3 cm) Weight: 91.9 kg (202 lb 11.2 oz) IBW/kg (Calculated) : 70.7 Heparin Dosing Weight: 92kg  Vital Signs: Temp: 97.6 F (36.4 C) (10/30 0803) Temp Source: Oral (10/30 0803) BP: 120/76 (10/30 0803) Pulse Rate: 60 (10/30 0803)  Labs: Recent Labs    10/04/20 2142 10/04/20 2354 10/05/20 1026 10/05/20 2318 10/06/20 1039  HGB 14.4  --   --   --  14.7  HCT 44.0  --   --   --  45.7  PLT 204  --   --   --  259  APTT  --   --  170* 158* 59*  HEPARINUNFRC  --   --  >2.20*  --  0.87*  CREATININE 1.21  --   --   --   --   TROPONINIHS 26* 24*  --   --   --     Estimated Creatinine Clearance: 63.6 mL/min (by C-G formula based on SCr of 1.21 mg/dL).   Assessment: 69 y.o. M presents with DVT diagnosed PTA at PCP - started on Xarelto 10/26 - last dose taken 10/28 ~1430. Pt now at [facility] with SOB over past 2 days and was found to have acute bilateral PE.   Level was supratherapeutic on heparin 1,300units/hr so rate was decreased to 1,000u/hr. APTT dropped to 59, slightly subtherapeutic. HL 0.87, levels not correlated yet. CBC WNL, no s/sx bleeding.   Plan is to restart Xarelto 10/31 AM, end time entered for heparin.   Goal of Therapy:  Heparin level 0.3-0.7 units/ml aPTT 66-102 seconds Monitor platelets by anticoagulation protocol: Yes   Plan:   Increase heparin gtt by 1u/kg/hr to 1,100u/hr Check anti-Xa level in 6 hours Continue to monitor H&H and platelets  F/u with restarting Xarelto tomorrow 10/31  Mercy Riding, PharmD PGY1 Acute Care Pharmacy Resident Please refer to [facility] for unit-specific pharmacist

## 2020-10-06 NOTE — Progress Notes (Signed)
ANTICOAGULATION CONSULT NOTE - Follow Up Consult  Pharmacy Consult for IV heparin and Xarelto Indication: DVT and PE  Allergies  Allergen Reactions  . Ceftin [Cefuroxime] Other (See Comments)    Severe dehydration  . Poison Ivy Extract Rash    Patient Measurements: Height: 5' 9"  (175.3 cm) Weight: 91.9 kg (202 lb 11.2 oz) IBW/kg (Calculated) : 70.7 Heparin Dosing Weight: 92kg  Vital Signs: Temp: 97.6 F (36.4 C) (10/30 1946) Temp Source: Oral (10/30 1946) BP: 150/79 (10/30 1946) Pulse Rate: 56 (10/30 1946)  Labs: Recent Labs    10/04/20 2142 10/04/20 2354 10/05/20 1026 10/05/20 1026 10/05/20 2318 10/06/20 1039 10/06/20 1905  HGB 14.4  --   --   --   --  14.7  --   HCT 44.0  --   --   --   --  45.7  --   PLT 204  --   --   --   --  259  --   APTT  --   --  170*   < > 158* 59* 65*  HEPARINUNFRC  --   --  >2.20*  --   --  0.87* 0.72*  CREATININE 1.21  --   --   --   --   --   --   TROPONINIHS 26* 24*  --   --   --   --   --    < > = values in this interval not displayed.    Estimated Creatinine Clearance: 63.6 mL/min (by C-G formula based on SCr of 1.21 mg/dL).   Assessment: 69 y.o. M presents with DVT diagnosed PTA at PCP - started on Xarelto 10/26 - last dose taken 10/28 ~1430. Pt now at [facility] with SOB over past 2 days and was found to have acute bilateral PE.   APTT slightly subtherapeutic s/p rate increase to 1100 units/hr  Plan is to restart Xarelto 10/31 AM, end time entered for heparin and Xarelto orders in place   Goal of Therapy:  Heparin level 0.3-0.7 units/ml aPTT 66-102 seconds Monitor platelets by anticoagulation protocol: Yes   Plan:   Slight increase of heparin gtt to 1150 units/hr F/u transition to Xarelto @0800  as ordered tomorrow   Bertis Ruddy, PharmD Clinical Pharmacist ED Pharmacist Phone # [phone] 10/06/2020 8:01 PM

## 2020-10-06 NOTE — Progress Notes (Signed)
PROGRESS NOTE                                                                                                                                                                                                             Patient Demographics:    Shaun Armstrong, is a 69 y.o. male, DOB - 01-04-1951, GGY:[id number]  Outpatient Primary MD for the patient is Vivi Barrack, MD    LOS - 1  Admit date - 10/04/2020    Chief Complaint  Patient presents with  . Leg Pain       Brief Narrative (HPI from H&P)  - Shaun Armstrong is a 69 y.o. male with medical history significant of Crohn's disease, hyperparathyroidism, GERD, hyperlipidemia, elevated calcium, and previous DVT approximately 2 years ago that was unprovoked.  He is transferred from the [facility] for acute PE.  He and his wife took a trip to [place] starting on October 4 and took their time going up driving approximately 4 hours/day staying in hotels when they drove home they did in 2 days.  During the day of arrival at home he developed leg swelling and pain as well as some shortness of breath.  He was started on Xarelto and arranged to have outpatient ultrasound and CTA which were both positive for DVT and PE, once he had positive tests he was sent to the hospital for admission.   Subjective:    Shaun Armstrong today has, No headache, No chest pain, No abdominal pain - No Nausea, No new weakness tingling or numbness, no SOB.   Assessment  & Plan :     1. Acute bilateral PE and acute right leg DVT with some exertional shortness of breath - he has had DVT 2 years ago as well, likely hypercoagulable and now needs to be on lifelong anticoagulation.  He was already started on Xarelto outpatient and currently on IV heparin, will trend D-dimer, since clot burden is high will continue heparin for another 24 hours.  If stable then switch back to Xarelto discharge home likely 10/07/2020.   I do not think he has failed Xarelto at all, it was started and then imaging tests were ordered which came back positive and he was admitted based on those test results.   SpO2: 100 %  Recent Labs  Lab  10/04/20 2142 10/04/20 2354  WBC 8.1  --   HGB 14.4  --   HCT 44.0  --   PLT 204  --   SARSCOV2NAA  --  NEGATIVE    2.  Dyslipidemia.  On statin.  3.  History of mild Crohn's.  Continue outpatient regimen.  4.  Hypertension.  On beta-blocker.     Condition - Fair  Family Communication  :  None  Code Status :  Full  Consults  :  None  Procedures  :    [place]  -  RIGHT: - There is no evidence of superficial venous thrombosis. - Deep vein thrombosis in the CFV, FV, and gastrocnemious veins. - Common femoral vein obstruction doesn't appear to extend above inguinal ligament  CTA - Extensive bilateral pulmonary embolism, without evidence of saddle embolus or heart strain  TTE - 1. Left ventricular ejection fraction, by estimation, is 60 to 65%. The left ventricle has normal function. The left ventricle has no regional wall motion abnormalities. Left ventricular diastolic parameters were normal.  2. Right ventricular systolic function is normal. The right ventricular size is normal. Tricuspid regurgitation signal is inadequate for assessing PA pressure.  3. The mitral valve is grossly normal. Trivial mitral valve regurgitation. No evidence of mitral stenosis.  4. The aortic valve is tricuspid. Aortic valve regurgitation is mild. No aortic stenosis is present.  5. The inferior vena cava is normal in size with greater than 50% respiratory variability, suggesting right atrial pressure of 3 mmHg.   PUD Prophylaxis : None  Disposition Plan  :    Status is: Inpatient  Remains inpatient appropriate because:IV treatments appropriate due to intensity of illness or inability to take PO   Dispo: The patient is from: Home              Anticipated d/c is to: Home              Anticipated  d/c date is: 3 days              Patient currently is not medically stable to d/c.   DVT Prophylaxis  :   Heparin    Lab Results  Component Value Date   PLT 204 10/04/2020    Diet :  Diet Order            Diet Heart Room service appropriate? Yes; Fluid consistency: Thin  Diet effective now                  Inpatient Medications  Scheduled Meds: . mesalamine  2.4 g Oral Q breakfast  . [START ON 10/07/2020] propranolol  10 mg Oral Daily  . rosuvastatin  20 mg Oral Daily  . sodium chloride flush  3 mL Intravenous Q12H   Continuous Infusions: . sodium chloride    . heparin 1,000 Units/hr (10/06/20 0137)   PRN Meds:.sodium chloride, acetaminophen **OR** [DISCONTINUED] acetaminophen, metoprolol tartrate, polyethylene glycol, sodium chloride flush, traZODone  Antibiotics  :    Anti-infectives (From admission, onward)   None       Time Spent in minutes  30   Shaun Armstrong M.D on 10/06/2020 at 11:03 AM  To page go to [email or web address] - password [email or web address]  [facility] -  Office  [phone]    See all Orders from today for further details    Objective:   Vitals:   10/05/20 1952 10/06/20 0011 10/06/20 0455 10/06/20 0803  BP:  111/79 109/63 120/76  Pulse: (!) 57  64 61 60  Resp:  16 17 16   Temp:  97.9 F (36.6 C) 97.9 F (36.6 C) 97.6 F (36.4 C)  TempSrc:  Oral Oral Oral  SpO2: 97% 93% 93% 100%  Weight:      Height:        Wt Readings from Last 3 Encounters:  10/05/20 91.9 kg  10/02/20 92.5 kg  06/07/20 90.6 kg     Intake/Output Summary (Last 24 hours) at 10/06/2020 1103 Last data filed at 10/06/2020 1012 Gross per 24 hour  Intake 815.55 ml  Output 2 ml  Net 813.55 ml     Physical Exam  Awake Alert, No new F.N deficits, chr. R facial droop [place].AT,PERRAL Supple Neck,No JVD, No cervical lymphadenopathy appriciated.  Symmetrical Chest wall movement, Good air movement bilaterally, CTAB RRR,No Gallops,Rubs or new Murmurs, No Parasternal  Heave +ve B.Sounds, Abd Soft, No tenderness, No organomegaly appriciated, No rebound - guarding or rigidity. No Cyanosis, mild R leg swelling    Data Review:    CBC Recent Labs  Lab 10/04/20 2142  WBC 8.1  HGB 14.4  HCT 44.0  PLT 204  MCV 91.3  MCH 29.9  MCHC 32.7  RDW 12.9  LYMPHSABS 1.9  MONOABS 0.8  EOSABS 0.3  BASOSABS 0.1    Recent Labs  Lab 10/04/20 2142  NA 140  K 3.9  CL 105  CO2 24  GLUCOSE 90  BUN 20  CREATININE 1.21  CALCIUM 10.9*    ------------------------------------------------------------------------------------------------------------------ No results for input(s): CHOL, HDL, LDLCALC, TRIG, CHOLHDL, LDLDIRECT in the last 72 hours.  Lab Results  Component Value Date   HGBA1C 5.7 11/23/2019   ------------------------------------------------------------------------------------------------------------------ No results for input(s): TSH, T4TOTAL, T3FREE, THYROIDAB in the last 72 hours.  Invalid input(s): FREET3  Cardiac Enzymes No results for input(s): CKMB, TROPONINI, MYOGLOBIN in the last 168 hours.  Invalid input(s): CK ------------------------------------------------------------------------------------------------------------------ No results found for: BNP  Micro Results Recent Results (from the past 240 hour(s))  Resp Panel by RT PCR (RSV, Flu A&B, Covid) - Nasopharyngeal Swab     Status: None   Collection Time: 10/04/20 11:54 PM   Specimen: Nasopharyngeal Swab  Result Value Ref Range Status   SARS Coronavirus 2 by RT PCR NEGATIVE NEGATIVE Final    Comment: (NOTE) SARS-CoV-2 target nucleic acids are NOT DETECTED.  The SARS-CoV-2 RNA is generally detectable in upper respiratoy specimens during the acute phase of infection. The lowest concentration of SARS-CoV-2 viral copies this assay can detect is 131 copies/mL. A negative result does not preclude SARS-Cov-2 infection and should not be used as the sole basis for treatment or  other patient management decisions. A negative result may occur with  improper specimen collection/handling, submission of specimen other than nasopharyngeal swab, presence of viral mutation(s) within the areas targeted by this assay, and inadequate number of viral copies (<131 copies/mL). A negative result must be combined with clinical observations, patient history, and epidemiological information. The expected result is Negative.  Fact Sheet for Patients:  PinkCheek.be  Fact Sheet for Healthcare Providers:  GravelBags.it  This test is no t yet approved or cleared by the [place] FDA and  has been authorized for detection and/or diagnosis of SARS-CoV-2 by FDA under an Emergency Use Authorization (EUA). This EUA will remain  in effect (meaning this test can be used) for the duration of the COVID-19 declaration under Section 564(b)(1) of the Act, 21 U.S.C. section 360bbb-3(b)(1), unless the authorization is terminated or revoked sooner.  Influenza A by PCR NEGATIVE NEGATIVE Final   Influenza B by PCR NEGATIVE NEGATIVE Final    Comment: (NOTE) The Xpert Xpress SARS-CoV-2/FLU/RSV assay is intended as an aid in  the diagnosis of influenza from Nasopharyngeal swab specimens and  should not be used as a sole basis for treatment. Nasal washings and  aspirates are unacceptable for Xpert Xpress SARS-CoV-2/FLU/RSV  testing.  Fact Sheet for Patients: PinkCheek.be  Fact Sheet for Healthcare Providers: GravelBags.it  This test is not yet approved or cleared by the [place] FDA and  has been authorized for detection and/or diagnosis of SARS-CoV-2 by  FDA under an Emergency Use Authorization (EUA). This EUA will remain  in effect (meaning this test can be used) for the duration of the  Covid-19 declaration under Section 564(b)(1) of the Act, 21  U.S.C. section  360bbb-3(b)(1), unless the authorization is  terminated or revoked.    Respiratory Syncytial Virus by PCR NEGATIVE NEGATIVE Final    Comment: (NOTE) Fact Sheet for Patients: PinkCheek.be  Fact Sheet for Healthcare Providers: GravelBags.it  This test is not yet approved or cleared by the [place] FDA and  has been authorized for detection and/or diagnosis of SARS-CoV-2 by  FDA under an Emergency Use Authorization (EUA). This EUA will remain  in effect (meaning this test can be used) for the duration of the  COVID-19 declaration under Section 564(b)(1) of the Act, 21 U.S.C.  section 360bbb-3(b)(1), unless the authorization is terminated or  revoked. Performed at [facility], [address], [place], [place] [address]     Radiology Reports CT Angio Chest PE W and/or Wo Contrast  Addendum Date: 10/05/2020   ADDENDUM REPORT: 10/05/2020 03:45 ADDENDUM: Results were discussed with Dr. Alvino Chapel at 11:47 p.m. [place] on October 04, 2020. Electronically Signed   By: Virgina Norfolk M.D.   On: 10/05/2020 03:45   Result Date: 10/05/2020 CLINICAL DATA:  Right leg pain x4 days.  History of DVT. EXAM: CT ANGIOGRAPHY CHEST WITH CONTRAST TECHNIQUE: Multidetector CT imaging of the chest was performed using the standard protocol during bolus administration of intravenous contrast. Multiplanar CT image reconstructions and MIPs were obtained to evaluate the vascular anatomy. CONTRAST:  142m OMNIPAQUE IOHEXOL 350 MG/ML SOLN COMPARISON:  None. FINDINGS: Cardiovascular: An extensive amount of intraluminal low attenuation is seen involving the bilateral pulmonary arteries as well as numerous bilateral upper lobe, right middle lobe and bilateral lower lobe branches. There is no evidence of saddle embolus. Normal heart size without evidence of heart strain. No pericardial effusion. Mediastinum/Nodes: No enlarged mediastinal, hilar, or  axillary lymph nodes. Thyroid gland, trachea, and esophagus demonstrate no significant findings. Lungs/Pleura: Lungs are clear. No pleural effusion or pneumothorax. Upper Abdomen: No acute abnormality. Musculoskeletal: No chest wall abnormality. No acute or significant osseous findings. Review of the MIP images confirms the above findings. IMPRESSION: Extensive bilateral pulmonary embolism, without evidence of saddle embolus or heart strain. Electronically Signed: By: TVirgina NorfolkM.D. On: 10/04/2020 23:31   ECHOCARDIOGRAM COMPLETE  Result Date: 10/05/2020    ECHOCARDIOGRAM REPORT   Patient Name:   Shaun SHOWERSDate of Exam: 10/05/2020 Medical Rec #:  [id number]   Height:       69.0 in Accession #:    [id number]  Weight:       202.7 lb Date of Birth:  801/15/1952    BSA:          2.078 m Patient Age:  69 years     BP:           115/73 mmHg Patient Gender: M            HR:           61 bpm. Exam Location:  Inpatient Procedure: 2D Echo, Cardiac Doppler and Color Doppler Indications:    Pulmonary Embolus 415.19 / I26.99  History:        Patient has prior history of Echocardiogram examinations, most                 recent 06/16/2017. Risk Factors:Dyslipidemia and Former Smoker.                 Pulmonary embolism.  Sonographer:    Vickie Epley RDCS Referring Phys: [place]  1. Left ventricular ejection fraction, by estimation, is 60 to 65%. The left ventricle has normal function. The left ventricle has no regional wall motion abnormalities. Left ventricular diastolic parameters were normal.  2. Right ventricular systolic function is normal. The right ventricular size is normal. Tricuspid regurgitation signal is inadequate for assessing PA pressure.  3. The mitral valve is grossly normal. Trivial mitral valve regurgitation. No evidence of mitral stenosis.  4. The aortic valve is tricuspid. Aortic valve regurgitation is mild. No aortic stenosis is present.  5. The inferior vena cava is  normal in size with greater than 50% respiratory variability, suggesting right atrial pressure of 3 mmHg. FINDINGS  Left Ventricle: Left ventricular ejection fraction, by estimation, is 60 to 65%. The left ventricle has normal function. The left ventricle has no regional wall motion abnormalities. The left ventricular internal cavity size was normal in size. There is  no left ventricular hypertrophy. Left ventricular diastolic parameters were normal. Right Ventricle: The right ventricular size is normal. No increase in right ventricular wall thickness. Right ventricular systolic function is normal. Tricuspid regurgitation signal is inadequate for assessing PA pressure. Left Atrium: Left atrial size was normal in size. Right Atrium: Right atrial size was normal in size. Pericardium: There is no evidence of pericardial effusion. Mitral Valve: The mitral valve is grossly normal. Trivial mitral valve regurgitation. No evidence of mitral valve stenosis. Tricuspid Valve: The tricuspid valve is grossly normal. Tricuspid valve regurgitation is trivial. No evidence of tricuspid stenosis. Aortic Valve: The aortic valve is tricuspid. Aortic valve regurgitation is mild. Aortic regurgitation PHT measures 1128 msec. No aortic stenosis is present. Pulmonic Valve: The pulmonic valve was grossly normal. Pulmonic valve regurgitation is not visualized. No evidence of pulmonic stenosis. Aorta: The aortic root is normal in size and structure. Venous: The inferior vena cava is normal in size with greater than 50% respiratory variability, suggesting right atrial pressure of 3 mmHg. IAS/Shunts: The interatrial septum appears to be lipomatous. The atrial septum is grossly normal.  LEFT VENTRICLE PLAX 2D LVIDd:         5.60 cm      Diastology LVIDs:         4.40 cm      LV e' medial:    7.72 cm/s LV PW:         0.90 cm      LV E/e' medial:  7.6 LV IVS:        0.90 cm      LV e' lateral:   9.25 cm/s LVOT diam:     2.10 cm      LV E/e'  lateral: 6.3 LV SV:  67 LV SV Index:   32 LVOT Area:     3.46 cm  LV Volumes (MOD) LV vol d, MOD A2C: 106.0 ml LV vol d, MOD A4C: 77.0 ml LV vol s, MOD A2C: 50.6 ml LV vol s, MOD A4C: 40.0 ml LV SV MOD A2C:     55.4 ml LV SV MOD A4C:     77.0 ml LV SV MOD BP:      45.1 ml RIGHT VENTRICLE RV S prime:     11.40 cm/s TAPSE (M-mode): 2.3 cm LEFT ATRIUM             Index       RIGHT ATRIUM           Index LA diam:        4.80 cm 2.31 cm/m  RA Area:     12.00 cm LA Vol (A2C):   47.1 ml 22.67 ml/m RA Volume:   27.60 ml  13.28 ml/m LA Vol (A4C):   38.0 ml 18.29 ml/m LA Biplane Vol: 45.3 ml 21.80 ml/m  AORTIC VALVE LVOT Vmax:   96.00 cm/s LVOT Vmean:  64.800 cm/s LVOT VTI:    0.192 m AI PHT:      1128 msec  AORTA Ao Root diam: 3.00 cm MITRAL VALVE MV Area (PHT): 3.53 cm    SHUNTS MV Decel Time: 215 msec    Systemic VTI:  0.19 m MV E velocity: 58.70 cm/s  Systemic Diam: 2.10 cm MV A velocity: 43.30 cm/s MV E/A ratio:  1.36 Eleonore Chiquito MD Electronically signed by Eleonore Chiquito MD Signature Date/Time: 10/05/2020/3:06:56 PM    Final    VAS [place] LOWER EXTREMITY VENOUS (DVT)  Result Date: 10/04/2020  Lower Venous DVTStudy Indications: DVT, and SOB.  Risk Factors: DVT 2019 right distal FA, popliteal v. Performing Technologist: Ralene Cork RVT  Examination Guidelines: A complete evaluation includes B-mode imaging, spectral Doppler, color Doppler, and power Doppler as needed of all accessible portions of each vessel. Bilateral testing is considered an integral part of a complete examination. Limited examinations for reoccurring indications may be performed as noted. The reflux portion of the exam is performed with the patient in reverse Trendelenburg.  +---------+---------------+---------+-----------+----------+-----------------+ RIGHT    CompressibilityPhasicitySpontaneityPropertiesThrombus Aging    +---------+---------------+---------+-----------+----------+-----------------+ CFV      Partial         No       Yes                  Acute             +---------+---------------+---------+-----------+----------+-----------------+ SFJ      Full                    Yes                                    +---------+---------------+---------+-----------+----------+-----------------+ FV Prox  None           No       No                   Acute             +---------+---------------+---------+-----------+----------+-----------------+ FV Mid   None           No       No  Acute             +---------+---------------+---------+-----------+----------+-----------------+ FV DistalNone           No       No                   Acute             +---------+---------------+---------+-----------+----------+-----------------+ POP      Partial        Yes      Yes                  Age Indeterminate +---------+---------------+---------+-----------+----------+-----------------+ PTV      Full                    Yes                                    +---------+---------------+---------+-----------+----------+-----------------+ PERO     Full                    Yes                                    +---------+---------------+---------+-----------+----------+-----------------+ Gastroc  None                    No                                     +---------+---------------+---------+-----------+----------+-----------------+ GSV      Full           Yes      Yes                  Acute             +---------+---------------+---------+-----------+----------+-----------------+  Findings reported to Dr [name] via [place] at 4:37 pm.  Summary: RIGHT: - There is no evidence of superficial venous thrombosis. - Deep vein thrombosis in the CFV, FV, and gastrocnemious veins. - Common femoral vein obstruction doesn't appear to extend above inguinal ligament.   *See table(s) above for measurements and observations.    Preliminary

## 2020-10-06 NOTE — Progress Notes (Signed)
Attempted to reach phelobotomy for aPTT level due at 0730 unable to reach them. AC aware.

## 2020-10-07 ENCOUNTER — Other Ambulatory Visit (HOSPITAL_COMMUNITY): Payer: Self-pay | Admitting: Internal Medicine

## 2020-10-07 LAB — COMPREHENSIVE METABOLIC PANEL
ALT: 27 U/L (ref 0–44)
AST: 28 U/L (ref 15–41)
Albumin: 3 g/dL — ABNORMAL LOW (ref 3.5–5.0)
Alkaline Phosphatase: 68 U/L (ref 38–126)
Anion gap: 8 (ref 5–15)
BUN: 19 mg/dL (ref 8–23)
CO2: 23 mmol/L (ref 22–32)
Calcium: 11 mg/dL — ABNORMAL HIGH (ref 8.9–10.3)
Chloride: 108 mmol/L (ref 98–111)
Creatinine, Ser: 1.06 mg/dL (ref 0.61–1.24)
GFR, Estimated: >60 mL/min (ref >60)
Glucose, Bld: 98 mg/dL (ref 70–99)
Potassium: 3.9 mmol/L (ref 3.5–5.1)
Sodium: 139 mmol/L (ref 135–145)
Total Bilirubin: 0.6 mg/dL (ref 0.3–1.2)
Total Protein: 5.8 g/dL — ABNORMAL LOW (ref 6.5–8.1)

## 2020-10-07 LAB — CBC
HCT: 40.2 % (ref 39.0–52.0)
Hemoglobin: 12.9 g/dL — ABNORMAL LOW (ref 13.0–17.0)
MCH: 29.3 pg (ref 26.0–34.0)
MCHC: 32.1 g/dL (ref 30.0–36.0)
MCV: 91.2 fL (ref 80.0–100.0)
Platelets: 233 10*3/uL (ref 150–400)
RBC: 4.41 MIL/uL (ref 4.22–5.81)
RDW: 12.8 % (ref 11.5–15.5)
WBC: 7.5 10*3/uL (ref 4.0–10.5)
nRBC: 0 % (ref 0.0–0.2)

## 2020-10-07 LAB — D-DIMER, QUANTITATIVE: D-Dimer, Quant: 3.29 ug/mL-FEU — ABNORMAL HIGH (ref 0.00–0.50)

## 2020-10-07 LAB — MAGNESIUM: Magnesium: 2.1 mg/dL (ref 1.7–2.4)

## 2020-10-07 MED ORDER — RIVAROXABAN 15 MG PO TABS
15.0000 mg | ORAL_TABLET | Freq: Two times a day (BID) | ORAL | 0 refills | Status: DC
Start: 2020-10-07 — End: 2021-03-08

## 2020-10-07 MED ORDER — RIVAROXABAN 20 MG PO TABS
20.0000 mg | ORAL_TABLET | Freq: Every day | ORAL | 0 refills | Status: DC
Start: 2020-10-28 — End: 2021-02-19

## 2020-10-07 NOTE — Plan of Care (Signed)
?  Problem: Clinical Measurements: ?Goal: Will remain free from infection ?Outcome: Progressing ?Goal: Diagnostic test results will improve ?Outcome: Progressing ?Goal: Respiratory complications will improve ?Outcome: Progressing ?  ?

## 2020-10-07 NOTE — Progress Notes (Signed)
Order received to discharge patient.  Telemetry monitor removed and CCMD notified.  PIV access removed.  Discharge instructions, follow up, medication and instructions for their use discussed with patient.

## 2020-10-07 NOTE — Discharge Instructions (Signed)
Follow with Primary MD Vivi Barrack, MD in 7 days   Get CBC, CMP, D dimer-  checked next visit within 1 week by Primary MD   Activity: As tolerated with Full fall precautions use walker/cane & assistance as needed  Disposition Home    Diet: Heart Healthy    Special Instructions: If you have smoked or chewed Tobacco  in the last 2 yrs please stop smoking, stop any regular Alcohol  and or any Recreational drug use.  On your next visit with your primary care physician please Get Medicines reviewed and adjusted.  Please request your Prim.MD to go over all Hospital Tests and Procedure/Radiological results at the follow up, please get all Hospital records sent to your Prim MD by signing hospital release before you go home.  If you experience worsening of your admission symptoms, develop shortness of breath, life threatening emergency, suicidal or homicidal thoughts you must seek medical attention immediately by calling 911 or calling your MD immediately  if symptoms less severe.  You Must read complete instructions/literature along with all the possible adverse reactions/side effects for all the Medicines you take and that have been prescribed to you. Take any new Medicines after you have completely understood and accpet all the possible adverse reactions/side effects.

## 2020-10-07 NOTE — Discharge Summary (Signed)
Shaun Armstrong OBS:[id number] DOB: 11/19/1951 DOA: 10/04/2020  PCP: Vivi Barrack, MD  Admit date: 10/04/2020  Discharge date: 10/07/2020  Admitted From: Home   Disposition:  Home   Recommendations for Outpatient Follow-up:   Follow up with PCP in 1-2 weeks  PCP Please obtain BMP/CBC, 2 view CXR in 1week,  (see Discharge instructions)   PCP Please follow up on the following pending results:    Home Health: None   Equipment/Devices: None  Consultations: None  Discharge Condition: Stable    CODE STATUS: Full    Diet Recommendation: Heart Healthy   Diet Order            Diet - low sodium heart healthy           Diet Heart Room service appropriate? Yes; Fluid consistency: Thin  Diet effective now                  Chief Complaint  Patient presents with  . Leg Pain     Brief history of present illness from the day of admission and additional interim summary    Shaun Armstrong a 69 y.o.malewith medical history significant ofCrohn's disease, hyperparathyroidism, GERD, hyperlipidemia, elevated calcium, and previous DVT approximately 2 years ago that was unprovoked. He is transferred from the [facility] for acute PE. He and his wife took a trip to [place] starting on October 4 and took their time going up driving approximately 4 hours/day staying in hotels when they drove home they did in 2 days. During the day of arrival at home he developed leg swelling and pain as well as some shortness of breath.  He was started on Xarelto and arranged to have outpatient ultrasound and CTA which were both positive for DVT and PE, once he had positive tests he was sent to the hospital for admission.                                                                 Hospital Course     1. Acute bilateral  PE and acute right leg DVT with some exertional shortness of breath - he has had DVT 2 years ago as well, likely hypercoagulable and now needs to be on lifelong anticoagulation.  He was already started on Xarelto outpatient and currently IV heparin here for over a day, which back to Xarelto and discharged home, I do not think he has failed Xarelto at all, it was started and then imaging tests were ordered which came back positive and he was admitted based on those test results.  PCP to monitor closely.  Would recommend lifelong prophylactic anticoagulation now.   2.  Dyslipidemia.  On statin.  3.  History of mild Crohn's.  Continue outpatient regimen.  4.  Hypertension.  On beta-blocker.   Discharge diagnosis     Principal Problem:   Bilateral pulmonary embolism (HCC) Active Problems:   Crohn's disease of both small and large intestine without complication (HCC)   Hyperlipidemia   Gastroesophageal reflux disease without esophagitis   Hyperparathyroidism, primary (HCC)   Hypercalcemia   Acute deep vein thrombosis (DVT) of proximal vein of right lower extremity ([place])   Pulmonary embolism (HCC)    Discharge instructions    Discharge Instructions    Diet - low sodium heart healthy   Complete by: As directed    Discharge instructions   Complete by: As directed    Follow with Primary MD Vivi Barrack, MD in 7 days   Get CBC, CMP, D dimer-  checked next visit within 1 week by Primary MD   Activity: As tolerated with Full fall precautions use walker/cane & assistance as needed  Disposition Home    Diet: Heart Healthy    Special Instructions: If you have smoked or chewed Tobacco  in the last 2 yrs please stop smoking, stop any regular Alcohol  and or any Recreational drug use.  On your next visit with your primary care physician please Get Medicines reviewed and adjusted.  Please request your Prim.MD to go over all Hospital Tests and Procedure/Radiological results at the  follow up, please get all Hospital records sent to your Prim MD by signing hospital release before you go home.  If you experience worsening of your admission symptoms, develop shortness of breath, life threatening emergency, suicidal or homicidal thoughts you must seek medical attention immediately by calling 911 or calling your MD immediately  if symptoms less severe.  You Must read complete instructions/literature along with all the possible adverse reactions/side effects for all the Medicines you take and that have been prescribed to you. Take any new Medicines after you have completely understood and accpet all the possible adverse reactions/side effects.   Increase activity slowly   Complete by: As directed       Discharge Medications   Allergies as of 10/07/2020      Reactions   Ceftin [cefuroxime] Other (See Comments)   Severe dehydration   Poison Ivy Extract Rash      Medication List    TAKE these medications   glucosamine-chondroitin 500-400 MG tablet Take 1 tablet by mouth daily.   mesalamine 1.2 g EC tablet Commonly known as: LIALDA Take 2.4 g by mouth daily with breakfast.   multivitamin tablet Take 1 tablet by mouth daily.   OMEGA 3 PO Take 2 capsules by mouth daily.   propranolol 20 MG tablet Commonly known as: INDERAL Take 2 tablets (40 mg total) by mouth daily.   Rivaroxaban 15 MG Tabs tablet Commonly known as: XARELTO Take 1 tablet (15 mg total) by mouth 2 (two) times daily with a meal for 21 days. What changed: Another medication with the same name was added. Make sure you understand how and when to take each.   rivaroxaban 20 MG Tabs tablet Commonly known as: XARELTO Take 1 tablet (20 mg total) by mouth daily with supper. Start taking on: October 28, 2020 What changed: You were already taking a medication with the same name, and this prescription was added. Make sure you understand how and when to take each.   rosuvastatin 20 MG tablet Commonly  known as: CRESTOR TAKE 1 TABLET (20 MG TOTAL) BY MOUTH DAILY. *NEED APPT* What changed: See the new instructions.   SYSTANE FREE OP Place  2 drops into both eyes 5 (five) times daily as needed (dry eyes).   Vitamin D 125 MCG (5000 UT) Caps Take 1 capsule by mouth every 3 (three) days.        Follow-up Information    Vivi Barrack, MD. Schedule an appointment as soon as possible for a visit in 1 week(s).   Specialty: Family Medicine Contact information: [address] [place] [address] [phone]               Major procedures and Radiology Reports - PLEASE review detailed and final reports thoroughly  -       CT Angio Chest PE W and/or Wo Contrast  Addendum Date: 10/05/2020   ADDENDUM REPORT: 10/05/2020 03:45 ADDENDUM: Results were discussed with Dr. Alvino Chapel at 11:47 p.m. [place] on October 04, 2020. Electronically Signed   By: Virgina Norfolk M.D.   On: 10/05/2020 03:45   Result Date: 10/05/2020 CLINICAL DATA:  Right leg pain x4 days.  History of DVT. EXAM: CT ANGIOGRAPHY CHEST WITH CONTRAST TECHNIQUE: Multidetector CT imaging of the chest was performed using the standard protocol during bolus administration of intravenous contrast. Multiplanar CT image reconstructions and MIPs were obtained to evaluate the vascular anatomy. CONTRAST:  162m OMNIPAQUE IOHEXOL 350 MG/ML SOLN COMPARISON:  None. FINDINGS: Cardiovascular: An extensive amount of intraluminal low attenuation is seen involving the bilateral pulmonary arteries as well as numerous bilateral upper lobe, right middle lobe and bilateral lower lobe branches. There is no evidence of saddle embolus. Normal heart size without evidence of heart strain. No pericardial effusion. Mediastinum/Nodes: No enlarged mediastinal, hilar, or axillary lymph nodes. Thyroid gland, trachea, and esophagus demonstrate no significant findings. Lungs/Pleura: Lungs are clear. No pleural effusion or pneumothorax. Upper Abdomen: No acute  abnormality. Musculoskeletal: No chest wall abnormality. No acute or significant osseous findings. Review of the MIP images confirms the above findings. IMPRESSION: Extensive bilateral pulmonary embolism, without evidence of saddle embolus or heart strain. Electronically Signed: By: TVirgina NorfolkM.D. On: 10/04/2020 23:31   ECHOCARDIOGRAM COMPLETE  Result Date: 10/05/2020    ECHOCARDIOGRAM REPORT   Patient Name:   GNOBLE CICALESEDate of Exam: 10/05/2020 Medical Rec #:  [id number]   Height:       69.0 in Accession #:    [id number]  Weight:       202.7 lb Date of Birth:  [birth year]    BSA:          2.078 m Patient Age:    89years     BP:           115/73 mmHg Patient Gender: M            HR:           61 bpm. Exam Location:  Inpatient Procedure: 2D Echo, Cardiac Doppler and Color Doppler Indications:    Pulmonary Embolus 415.19 / I26.99  History:        Patient has prior history of Echocardiogram examinations, most                 recent 06/16/2017. Risk Factors:Dyslipidemia and Former Smoker.                 Pulmonary embolism.  Sonographer:    JVickie EpleyRDCS Referring Phys: 2[place] 1. Left ventricular ejection fraction, by estimation, is 60 to 65%. The left ventricle has normal function. The left ventricle has no regional wall motion abnormalities.  Left ventricular diastolic parameters were normal.  2. Right ventricular systolic function is normal. The right ventricular size is normal. Tricuspid regurgitation signal is inadequate for assessing PA pressure.  3. The mitral valve is grossly normal. Trivial mitral valve regurgitation. No evidence of mitral stenosis.  4. The aortic valve is tricuspid. Aortic valve regurgitation is mild. No aortic stenosis is present.  5. The inferior vena cava is normal in size with greater than 50% respiratory variability, suggesting right atrial pressure of 3 mmHg. FINDINGS  Left Ventricle: Left ventricular ejection fraction, by estimation, is 60 to  65%. The left ventricle has normal function. The left ventricle has no regional wall motion abnormalities. The left ventricular internal cavity size was normal in size. There is  no left ventricular hypertrophy. Left ventricular diastolic parameters were normal. Right Ventricle: The right ventricular size is normal. No increase in right ventricular wall thickness. Right ventricular systolic function is normal. Tricuspid regurgitation signal is inadequate for assessing PA pressure. Left Atrium: Left atrial size was normal in size. Right Atrium: Right atrial size was normal in size. Pericardium: There is no evidence of pericardial effusion. Mitral Valve: The mitral valve is grossly normal. Trivial mitral valve regurgitation. No evidence of mitral valve stenosis. Tricuspid Valve: The tricuspid valve is grossly normal. Tricuspid valve regurgitation is trivial. No evidence of tricuspid stenosis. Aortic Valve: The aortic valve is tricuspid. Aortic valve regurgitation is mild. Aortic regurgitation PHT measures 1128 msec. No aortic stenosis is present. Pulmonic Valve: The pulmonic valve was grossly normal. Pulmonic valve regurgitation is not visualized. No evidence of pulmonic stenosis. Aorta: The aortic root is normal in size and structure. Venous: The inferior vena cava is normal in size with greater than 50% respiratory variability, suggesting right atrial pressure of 3 mmHg. IAS/Shunts: The interatrial septum appears to be lipomatous. The atrial septum is grossly normal.  LEFT VENTRICLE PLAX 2D LVIDd:         5.60 cm      Diastology LVIDs:         4.40 cm      LV e' medial:    7.72 cm/s LV PW:         0.90 cm      LV E/e' medial:  7.6 LV IVS:        0.90 cm      LV e' lateral:   9.25 cm/s LVOT diam:     2.10 cm      LV E/e' lateral: 6.3 LV SV:         67 LV SV Index:   32 LVOT Area:     3.46 cm  LV Volumes (MOD) LV vol d, MOD A2C: 106.0 ml LV vol d, MOD A4C: 77.0 ml LV vol s, MOD A2C: 50.6 ml LV vol s, MOD A4C: 40.0 ml  LV SV MOD A2C:     55.4 ml LV SV MOD A4C:     77.0 ml LV SV MOD BP:      45.1 ml RIGHT VENTRICLE RV S prime:     11.40 cm/s TAPSE (M-mode): 2.3 cm LEFT ATRIUM             Index       RIGHT ATRIUM           Index LA diam:        4.80 cm 2.31 cm/m  RA Area:     12.00 cm LA Vol (A2C):   47.1 ml 22.67 ml/m RA Volume:  27.60 ml  13.28 ml/m LA Vol (A4C):   38.0 ml 18.29 ml/m LA Biplane Vol: 45.3 ml 21.80 ml/m  AORTIC VALVE LVOT Vmax:   96.00 cm/s LVOT Vmean:  64.800 cm/s LVOT VTI:    0.192 m AI PHT:      1128 msec  AORTA Ao Root diam: 3.00 cm MITRAL VALVE MV Area (PHT): 3.53 cm    SHUNTS MV Decel Time: 215 msec    Systemic VTI:  0.19 m MV E velocity: 58.70 cm/s  Systemic Diam: 2.10 cm MV A velocity: 43.30 cm/s MV E/A ratio:  1.36 Eleonore Chiquito MD Electronically signed by Eleonore Chiquito MD Signature Date/Time: 10/05/2020/3:06:56 PM    Final    VAS [place] LOWER EXTREMITY VENOUS (DVT)  Result Date: 10/04/2020  Lower Venous DVTStudy Indications: DVT, and SOB.  Risk Factors: DVT 2019 right distal FA, popliteal v. Performing Technologist: Ralene Cork RVT  Examination Guidelines: A complete evaluation includes B-mode imaging, spectral Doppler, color Doppler, and power Doppler as needed of all accessible portions of each vessel. Bilateral testing is considered an integral part of a complete examination. Limited examinations for reoccurring indications may be performed as noted. The reflux portion of the exam is performed with the patient in reverse Trendelenburg.  +---------+---------------+---------+-----------+----------+-----------------+ RIGHT    CompressibilityPhasicitySpontaneityPropertiesThrombus Aging    +---------+---------------+---------+-----------+----------+-----------------+ CFV      Partial        No       Yes                  Acute             +---------+---------------+---------+-----------+----------+-----------------+ SFJ      Full                    Yes                                     +---------+---------------+---------+-----------+----------+-----------------+ FV Prox  None           No       No                   Acute             +---------+---------------+---------+-----------+----------+-----------------+ FV Mid   None           No       No                   Acute             +---------+---------------+---------+-----------+----------+-----------------+ FV DistalNone           No       No                   Acute             +---------+---------------+---------+-----------+----------+-----------------+ POP      Partial        Yes      Yes                  Age Indeterminate +---------+---------------+---------+-----------+----------+-----------------+ PTV      Full                    Yes                                    +---------+---------------+---------+-----------+----------+-----------------+  PERO     Full                    Yes                                    +---------+---------------+---------+-----------+----------+-----------------+ Gastroc  None                    No                                     +---------+---------------+---------+-----------+----------+-----------------+ GSV      Full           Yes      Yes                  Acute             +---------+---------------+---------+-----------+----------+-----------------+  Findings reported to Dr [name] via [place] at 4:37 pm.  Summary: RIGHT: - There is no evidence of superficial venous thrombosis. - Deep vein thrombosis in the CFV, FV, and gastrocnemious veins. - Common femoral vein obstruction doesn't appear to extend above inguinal ligament.   *See table(s) above for measurements and observations.    Preliminary     Micro Results     Recent Results (from the past 240 hour(s))  Resp Panel by RT PCR (RSV, Flu A&B, Covid) - Nasopharyngeal Swab     Status: None   Collection Time: 10/04/20 11:54 PM   Specimen: Nasopharyngeal Swab  Result Value Ref  Range Status   SARS Coronavirus 2 by RT PCR NEGATIVE NEGATIVE Final    Comment: (NOTE) SARS-CoV-2 target nucleic acids are NOT DETECTED.  The SARS-CoV-2 RNA is generally detectable in upper respiratoy specimens during the acute phase of infection. The lowest concentration of SARS-CoV-2 viral copies this assay can detect is 131 copies/mL. A negative result does not preclude SARS-Cov-2 infection and should not be used as the sole basis for treatment or other patient management decisions. A negative result may occur with  improper specimen collection/handling, submission of specimen other than nasopharyngeal swab, presence of viral mutation(s) within the areas targeted by this assay, and inadequate number of viral copies (<131 copies/mL). A negative result must be combined with clinical observations, patient history, and epidemiological information. The expected result is Negative.  Fact Sheet for Patients:  PinkCheek.be  Fact Sheet for Healthcare Providers:  GravelBags.it  This test is no t yet approved or cleared by the [place] FDA and  has been authorized for detection and/or diagnosis of SARS-CoV-2 by FDA under an Emergency Use Authorization (EUA). This EUA will remain  in effect (meaning this test can be used) for the duration of the COVID-19 declaration under Section 564(b)(1) of the Act, 21 U.S.C. section 360bbb-3(b)(1), unless the authorization is terminated or revoked sooner.     Influenza A by PCR NEGATIVE NEGATIVE Final   Influenza B by PCR NEGATIVE NEGATIVE Final    Comment: (NOTE) The Xpert Xpress SARS-CoV-2/FLU/RSV assay is intended as an aid in  the diagnosis of influenza from Nasopharyngeal swab specimens and  should not be used as a sole basis for treatment. Nasal washings and  aspirates are unacceptable for Xpert Xpress SARS-CoV-2/FLU/RSV  testing.  Fact Sheet for  Patients: PinkCheek.be  Fact Sheet for Healthcare Providers: GravelBags.it  This test is not  yet approved or cleared by the [place] and  has been authorized for detection and/or diagnosis of SARS-CoV-2 by  FDA under an Emergency Use Authorization (EUA). This EUA will remain  in effect (meaning this test can be used) for the duration of the  Covid-19 declaration under Section 564(b)(1) of the Act, 21  U.S.C. section 360bbb-3(b)(1), unless the authorization is  terminated or revoked.    Respiratory Syncytial Virus by PCR NEGATIVE NEGATIVE Final    Comment: (NOTE) Fact Sheet for Patients: PinkCheek.be  Fact Sheet for Healthcare Providers: GravelBags.it  This test is not yet approved or cleared by the [place] FDA and  has been authorized for detection and/or diagnosis of SARS-CoV-2 by  FDA under an Emergency Use Authorization (EUA). This EUA will remain  in effect (meaning this test can be used) for the duration of the  COVID-19 declaration under Section 564(b)(1) of the Act, 21 U.S.C.  section 360bbb-3(b)(1), unless the authorization is terminated or  revoked. Performed at [facility], [address] [place], [place] [address]     Today   Subjective    Shaun Armstrong today has no headache,no chest abdominal pain,no new weakness tingling or numbness, feels much better wants to go home today.     Objective   Blood pressure 130/77, pulse 76, temperature 97.7 F (36.5 C), temperature source Oral, resp. rate 16, height 5' 9"  (1.753 m), weight 91.9 kg, SpO2 93 %.   Intake/Output Summary (Last 24 hours) at 10/07/2020 0924 Last data filed at 10/06/2020 2356 Gross per 24 hour  Intake 250.15 ml  Output 2 ml  Net 248.15 ml    Exam  Awake Alert, No new F.N deficits, Normal affect [place].AT,PERRAL Supple Neck,No JVD, No cervical  lymphadenopathy appriciated.  Symmetrical Chest wall movement, Good air movement bilaterally, CTAB RRR,No Gallops,Rubs or new Murmurs, No Parasternal Heave +ve B.Sounds, Abd Soft, Non tender, No organomegaly appriciated, No rebound -guarding or rigidity. No Cyanosis, R leg mildly swollen, subtle L facial droop   Data Review   CBC w Diff:  Lab Results  Component Value Date   WBC 7.5 10/07/2020   HGB 12.9 (L) 10/07/2020   HCT 40.2 10/07/2020   PLT 233 10/07/2020   LYMPHOPCT 24 10/04/2020   MONOPCT 10 10/04/2020   EOSPCT 4 10/04/2020   BASOPCT 1 10/04/2020    CMP:  Lab Results  Component Value Date   NA 139 10/07/2020   NA 143 07/01/2016   K 3.9 10/07/2020   CL 108 10/07/2020   CO2 23 10/07/2020   BUN 19 10/07/2020   CREATININE 1.06 10/07/2020   PROT 5.8 (L) 10/07/2020   ALBUMIN 3.0 (L) 10/07/2020   BILITOT 0.6 10/07/2020   ALKPHOS 68 10/07/2020   AST 28 10/07/2020   ALT 27 10/07/2020  .   Total Time in preparing paper work, data evaluation and todays exam - 30 minutes  Lala Lund M.D on 10/07/2020 at 9:24 AM  [facility]   Office  [phone]

## 2020-10-07 NOTE — Care Management (Addendum)
30 day Free Xarelto card given to patient.  No questions or concerns voiced by patient.

## 2020-10-08 ENCOUNTER — Telehealth: Payer: Self-pay

## 2020-10-08 NOTE — Telephone Encounter (Cosign Needed)
Transition Care Management Follow-up Telephone Call  Date of discharge and from where: 10/07/20 from [facility]   How have you been since you were released from the hospital? Much better  Any questions or concerns? No  Items Reviewed:  Did the pt receive and understand the discharge instructions provided? Yes   Medications obtained and verified? Yes   Other? No   Any new allergies since your discharge? Yes   Dietary orders reviewed? Yes  Do you have support at home? Yes wife [facility] and Equipment/Supplies: Were home health services ordered? not applicable If so, what is the name of the agency?   Has the agency set up a time to come to the patient's home? not applicable Were any new equipment or medical supplies ordered?  No What is the name of the medical supply agency?  Were you able to get the supplies/equipment? not applicable Do you have any questions related to the use of the equipment or supplies? No  Functional Questionnaire: (I = Independent and D = Dependent) ADLs: I  Bathing/Dressing- I  Meal Prep- I  Eating- I  Maintaining continence- I  Transferring/Ambulation- I  Managing Meds- I  Follow up appointments reviewed:   PCP Hospital f/u appt confirmed? Yes  Scheduled to see Dr [name] on 10/10/20 @ 10:40a.m   Are transportation arrangements needed? No   If their condition worsens, is the pt aware to call PCP or go to the Emergency Dept.? Yes  Was the patient provided with contact information for the PCP's office or ED? Yes  Was to pt encouraged to call back with questions or concerns? Yes

## 2020-10-10 ENCOUNTER — Ambulatory Visit (INDEPENDENT_AMBULATORY_CARE_PROVIDER_SITE_OTHER): Payer: PPO | Admitting: Family Medicine

## 2020-10-10 ENCOUNTER — Other Ambulatory Visit: Payer: Self-pay

## 2020-10-10 VITALS — BP 105/69 | HR 68 | Temp 97.2°F | Ht 69.0 in | Wt 200.4 lb

## 2020-10-10 DIAGNOSIS — I824Y1 Acute embolism and thrombosis of unspecified deep veins of right proximal lower extremity: Secondary | ICD-10-CM | POA: Diagnosis not present

## 2020-10-10 DIAGNOSIS — I2699 Other pulmonary embolism without acute cor pulmonale: Secondary | ICD-10-CM

## 2020-10-10 DIAGNOSIS — H10212 Acute toxic conjunctivitis, left eye: Secondary | ICD-10-CM | POA: Diagnosis not present

## 2020-10-10 MED FILL — XARELTO 15 MG TABLET: 15 | 15 days supply | Qty: 30 | Fill #0
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
## 2020-10-10 NOTE — Assessment & Plan Note (Signed)
Doing well with Xarelto.  Will continue.  Check CBC and CMET today.  Discussed with patient that he will need lifelong anticoagulation.

## 2020-10-10 NOTE — Patient Instructions (Signed)
It was very nice to see you today!  I am glad that you are doing better.  You will need to keep taking the blood thinner for the rest of your life.  Please see your eye doctor soon.  I will see back in 3 to 6 months for your annual checkup with blood work.  Please come back to see me  Take care, Dr [name]  Please try these tips to maintain a healthy lifestyle:   Eat at least 3 REAL meals and 1-2 snacks per day.  Aim for no more than 5 hours between eating.  If you eat breakfast, please do so within one hour of getting up.    Each meal should contain half fruits/vegetables, one quarter protein, and one quarter carbs (no bigger than a computer mouse)   Cut down on sweet beverages. This includes juice, soda, and sweet tea.     Drink at least 1 glass of water with each meal and aim for at least 8 glasses per day   Exercise at least 150 minutes every week.

## 2020-10-10 NOTE — Assessment & Plan Note (Signed)
Doing well with Xarelto.  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  Check CBC in CMET today.

## 2020-10-11 ENCOUNTER — Other Ambulatory Visit: Payer: Self-pay

## 2020-10-11 DIAGNOSIS — R7989 Other specified abnormal findings of blood chemistry: Secondary | ICD-10-CM

## 2020-10-11 LAB — COMPREHENSIVE METABOLIC PANEL
AG Ratio: 2 (calc) (ref 1.0–2.5)
ALT: 71 U/L — ABNORMAL HIGH (ref 9–46)
AST: 59 U/L — ABNORMAL HIGH (ref 10–35)
Albumin: 4.2 g/dL (ref 3.6–5.1)
Alkaline phosphatase (APISO): 84 U/L (ref 35–144)
BUN: 21 mg/dL (ref 7–25)
CO2: 27 mmol/L (ref 20–32)
Calcium: 11.8 mg/dL — ABNORMAL HIGH (ref 8.6–10.3)
Chloride: 109 mmol/L (ref 98–110)
Creat: 1.06 mg/dL (ref 0.70–1.18)
Globulin: 2.1 g/dL (calc) (ref 1.9–3.7)
Glucose, Bld: 77 mg/dL (ref 65–99)
Potassium: 5.6 mmol/L — ABNORMAL HIGH (ref 3.5–5.3)
Sodium: 144 mmol/L (ref 135–146)
Total Bilirubin: 0.6 mg/dL (ref 0.2–1.2)
Total Protein: 6.3 g/dL (ref 6.1–8.1)

## 2020-10-11 LAB — CBC
HCT: 46.3 % (ref 38.5–50.0)
Hemoglobin: 14.9 g/dL (ref 13.2–17.1)
MCH: 29.2 pg (ref 27.0–33.0)
MCHC: 32.2 g/dL (ref 32.0–36.0)
MCV: 90.6 fL (ref 80.0–100.0)
MPV: 10 fL (ref 7.5–12.5)
Platelets: 302 10*3/uL (ref 140–400)
RBC: 5.11 10*6/uL (ref 4.20–5.80)
RDW: 12.3 % (ref 11.0–15.0)
WBC: 8 10*3/uL (ref 3.8–10.8)

## 2020-10-11 NOTE — Progress Notes (Signed)
Please inform patient of the following:  Potassium and liver numbers are elevated. Do not have a clear explanation for this but would to have him come back in a week or so to recheck.  Algis Greenhouse. [name], MD 10/11/2020 11:13 AM

## 2020-10-16 ENCOUNTER — Other Ambulatory Visit: Payer: Self-pay

## 2020-10-16 ENCOUNTER — Other Ambulatory Visit: Payer: PPO

## 2020-10-16 DIAGNOSIS — R7989 Other specified abnormal findings of blood chemistry: Secondary | ICD-10-CM

## 2020-10-16 LAB — COMPREHENSIVE METABOLIC PANEL
AG Ratio: 1.9 (calc) (ref 1.0–2.5)
ALT: 33 U/L (ref 9–46)
AST: 26 U/L (ref 10–35)
Albumin: 4 g/dL (ref 3.6–5.1)
Alkaline phosphatase (APISO): 79 U/L (ref 35–144)
BUN: 18 mg/dL (ref 7–25)
CO2: 30 mmol/L (ref 20–32)
Calcium: 11.2 mg/dL — ABNORMAL HIGH (ref 8.6–10.3)
Chloride: 108 mmol/L (ref 98–110)
Creat: 0.99 mg/dL (ref 0.70–1.18)
Globulin: 2.1 g/dL (calc) (ref 1.9–3.7)
Glucose, Bld: 84 mg/dL (ref 65–99)
Potassium: 4.6 mmol/L (ref 3.5–5.3)
Sodium: 142 mmol/L (ref 135–146)
Total Bilirubin: 0.5 mg/dL (ref 0.2–1.2)
Total Protein: 6.1 g/dL (ref 6.1–8.1)

## 2020-10-16 NOTE — Progress Notes (Signed)
Please inform patient of the following:  Liver numbers back to normal. Do not need to do any further testing at this point.  Shaun Armstrong. [name], MD 10/16/2020 3:34 PM

## 2020-10-17 ENCOUNTER — Telehealth: Payer: Self-pay

## 2020-10-17 NOTE — Telephone Encounter (Signed)
Date of birth verified by patient  Lab results given,Pt verbalized understanding  See results note

## 2020-10-17 NOTE — Telephone Encounter (Signed)
Patient calling regarding test results

## 2020-10-18 MED FILL — MESALAMINE 1.2 GM TBEC: 1.2 | 60 days supply | Qty: 120 | Fill #5

## 2020-10-22 ENCOUNTER — Encounter: Payer: Self-pay | Admitting: Family Medicine

## 2020-10-22 NOTE — Telephone Encounter (Signed)
Please advise 

## 2020-10-23 MED FILL — XARELTO 20 MG TABLET: 20 | 30 days supply | Qty: 30 | Fill #0

## 2020-10-24 ENCOUNTER — Encounter: Payer: Self-pay | Admitting: Family Medicine

## 2020-10-24 NOTE — Telephone Encounter (Signed)
Letter send to patient.  Pt aware of information

## 2020-10-24 NOTE — Telephone Encounter (Signed)
Letter done, patient notified

## 2020-10-29 ENCOUNTER — Ambulatory Visit: Payer: PPO | Attending: Internal Medicine

## 2020-10-29 ENCOUNTER — Other Ambulatory Visit (HOSPITAL_BASED_OUTPATIENT_CLINIC_OR_DEPARTMENT_OTHER): Payer: Self-pay | Admitting: Internal Medicine

## 2020-10-29 DIAGNOSIS — Z23 Encounter for immunization: Secondary | ICD-10-CM

## 2020-10-29 MED FILL — MODERNA COVID-19 VACCINE 10: 100 | 1 days supply | Qty: 0 | Fill #0

## 2020-10-29 NOTE — Progress Notes (Signed)
   Covid-19 Vaccination Clinic  Name:  Shaun Armstrong    MRN: [id number] DOB: 23-Jan-1951  10/29/2020  [name] was observed post Covid-19 immunization for 15 minutes without incident. He was provided with Vaccine Information Sheet and instruction to access the V-Safe system.   [name] was instructed to call 911 with any severe reactions post vaccine: Marland Kitchen Difficulty breathing  . Swelling of face and throat  . A fast heartbeat  . A bad rash all over body  . Dizziness and weakness   Immunizations Administered    No immunizations on file.

## 2020-11-20 DIAGNOSIS — K5090 Crohn's disease, unspecified, without complications: Secondary | ICD-10-CM | POA: Diagnosis not present

## 2020-11-20 MED FILL — XARELTO 20 MG TABLET: 20 | 30 days supply | Qty: 30 | Fill #0

## 2020-12-17 ENCOUNTER — Other Ambulatory Visit: Payer: Self-pay | Admitting: Family Medicine

## 2020-12-17 ENCOUNTER — Other Ambulatory Visit (HOSPITAL_BASED_OUTPATIENT_CLINIC_OR_DEPARTMENT_OTHER): Payer: Self-pay | Admitting: Gastroenterology

## 2020-12-17 DIAGNOSIS — E785 Hyperlipidemia, unspecified: Secondary | ICD-10-CM

## 2020-12-17 MED FILL — XARELTO 20 MG TABLET: 20 | 30 days supply | Qty: 30 | Fill #1

## 2020-12-17 MED FILL — MESALAMINE 1.2 GM TBEC: 1.2 | 60 days supply | Qty: 120 | Fill #0

## 2020-12-18 ENCOUNTER — Other Ambulatory Visit: Payer: Self-pay | Admitting: Family Medicine

## 2020-12-18 MED FILL — ROSUVASTATIN CALCIUM 20 MG: 20 | 90 days supply | Qty: 90 | Fill #0

## 2020-12-18 MED FILL — PROPRANOLOL 20 MG TABLET: 20 | 90 days supply | Qty: 180 | Fill #0

## 2021-01-14 ENCOUNTER — Other Ambulatory Visit: Payer: Self-pay

## 2021-01-14 ENCOUNTER — Ambulatory Visit (INDEPENDENT_AMBULATORY_CARE_PROVIDER_SITE_OTHER): Payer: PPO

## 2021-01-14 VITALS — BP 120/64 | HR 62 | Temp 98.0°F | Resp 20 | Wt 203.6 lb

## 2021-01-14 DIAGNOSIS — H26492 Other secondary cataract, left eye: Secondary | ICD-10-CM | POA: Diagnosis not present

## 2021-01-14 DIAGNOSIS — H524 Presbyopia: Secondary | ICD-10-CM | POA: Diagnosis not present

## 2021-01-14 DIAGNOSIS — Z0000 Encounter for general adult medical examination without abnormal findings: Secondary | ICD-10-CM | POA: Diagnosis not present

## 2021-01-14 DIAGNOSIS — H43813 Vitreous degeneration, bilateral: Secondary | ICD-10-CM | POA: Diagnosis not present

## 2021-01-14 DIAGNOSIS — H04123 Dry eye syndrome of bilateral lacrimal glands: Secondary | ICD-10-CM | POA: Diagnosis not present

## 2021-01-14 NOTE — Progress Notes (Signed)
Subjective:   Shaun Armstrong is a 70 y.o. male who presents for Medicare Annual/Subsequent preventive examination.  Review of Systems     Cardiac Risk Factors include: advanced age (>50mn, >>65women);dyslipidemia;male gender;obesity (BMI >30kg/m2)     Objective:    Today's Vitals   01/14/21 0849  BP: 120/64  Pulse: 62  Resp: 20  Temp: 98 F (36.7 C)  SpO2: 96%  Weight: 203 lb 9.6 oz (92.4 kg)   Body mass index is 30.07 kg/m.  Advanced Directives 01/14/2021 10/04/2020 04/29/2018 07/21/2017 07/16/2016 07/10/2016  Does Patient Have a Medical Advance Directive? Yes Yes No Yes Yes Yes  Type of [place] - [place] - Living will  Does patient want to make changes to medical advance directive? - - - No - Patient declined - No - Patient declined  Copy of [place] Chart? No - copy requested - - No - copy requested - -  Would patient like information on creating a medical advance directive? - - No - Patient declined - - -    Current Medications (verified) Outpatient Encounter Medications as of 01/14/2021  Medication Sig  . Cholecalciferol (VITAMIN D) 125 MCG (5000 UT) CAPS Take 1 capsule by mouth every 3 (three) days.  .Marland Kitchenglucosamine-chondroitin 500-400 MG tablet Take 1 tablet by mouth daily.   . mesalamine (LIALDA) 1.2 g EC tablet Take 2.4 g by mouth daily with breakfast.  . Multiple Vitamin (MULTIVITAMIN) tablet Take 1 tablet by mouth daily.  . Omega-3 Fatty Acids (OMEGA 3 PO) Take 2 capsules by mouth daily.  .Vladimir FasterGlycol-Propyl Glycol (SYSTANE FREE OP) Place 2 drops into both eyes 5 (five) times daily as needed (dry eyes).  . Probiotic Product (ALIGN) 4 MG CAPS 1 tablet  . propranolol (INDERAL) 20 MG tablet TAKE 2 TABLETS (40 MG TOTAL) BY MOUTH DAILY.  . rosuvastatin (CRESTOR) 20 MG tablet TAKE 1 TABLET BY MOUTH ONCE DAILY  . Rivaroxaban  (XARELTO) 15 MG TABS tablet Take 1 tablet (15 mg total) by mouth 2 (two) times daily with a meal for 21 days.  . rivaroxaban (XARELTO) 20 MG TABS tablet Take 1 tablet (20 mg total) by mouth daily with supper. (Patient not taking: Reported on 01/14/2021)   No facility-administered encounter medications on file as of 01/14/2021.    Allergies (verified) Ceftin [cefuroxime] and Poison ivy extract   History: Past Medical History:  Diagnosis Date  . 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 Crohn's disease ([place]   . Deafness in right ear, wears a microphone   . Decreased peripheral vision 11/04/2011  . Essential tremor   . HLD (hyperlipidemia)   . Inguinal hernia 07/2016  . Inguinal hernia   . Retinal detachment   . Wears hearing aid, left ear    Past Surgical History:  Procedure Laterality Date  . ACOUSTIC NEUROMA RESECTION Right   . BROW LIFT AND BLEPHAROPLASTY Bilateral 10/11/2012  . CATARACT EXTRACTION W/ INTRAOCULAR LENS  IMPLANT, BILATERAL Bilateral   . HERNIA REPAIR    . INGUINAL HERNIA REPAIR Right 07/16/2016   Procedure: LAPAROSCOPIC RIGHT INGUINAL HERNIA;  Surgeon: DCoralie Keens MD;  [facility]  Service: General;  Laterality: Right;  . INSERTION OF MESH Right 07/16/2016   Procedure: INSERTION OF MESH;  Surgeon: DCoralie Keens MD;  [facility]  Service: General;  Laterality: Right;  . RETINAL DETACHMENT  SURGERY Left   . TONSILLECTOMY     Family History  Problem Relation Age of Onset  . Diabetes Mother   . Dementia Mother   . Diabetes Sister   . Hyperparathyroidism Neg Hx    Social History   Socioeconomic History  . Marital status: Married    Spouse name: Not on file  . Number of children: Not on file  . Years of education: Not on file  . Highest education level: Not on file  Occupational History  . Occupation: retired  Tobacco Use  . Smoking status: Former Smoker    Packs/day: 1.00    Years: 4.00    Pack years: 4.00     Types: Cigarettes    Quit date: 12/09/1979    Years since quitting: 41.1  . Smokeless tobacco: Never Used  . Tobacco comment: Quit 38 years ago  Vaping Use  . Vaping Use: Never used  Substance and Sexual Activity  . Alcohol use: Yes    Comment: recent ER visit fall after having a few beers  . Drug use: No  . Sexual activity: Not on file  Other Topics Concern  . Not on file  Social History Narrative  . Not on file   Social Determinants of Health   Financial Resource Strain: Low Risk   . Difficulty of Paying Living Expenses: Not hard at all  Food Insecurity: No Food Insecurity  . Worried About Charity fundraiser in the Last Year: Never true  . Ran Out of Food in the Last Year: Never true  Transportation Needs: No Transportation Needs  . Lack of Transportation (Medical): No  . Lack of Transportation (Non-Medical): No  Physical Activity: Sufficiently Active  . Days of Exercise per Week: 7 days  . Minutes of Exercise per Session: 60 min  Stress: No Stress Concern Present  . Feeling of Stress : Not at all  Social Connections: Moderately Integrated  . Frequency of Communication with Friends and Family: More than three times a week  . Frequency of Social Gatherings with Friends and Family: More than three times a week  . Attends Religious Services: 1 to 4 times per year  . Active Member of Clubs or Organizations: No  . Attends Archivist Meetings: Never  . Marital Status: Married    Tobacco Counseling Counseling given: Not Answered Comment: Quit 38 years ago   Clinical Intake:  Pre-visit preparation completed: Yes  Pain : No/denies pain     BMI - recorded: 30.07 Nutritional Status: BMI > 30  Obese Nutritional Risks: None Diabetes: No  How often do you need to have someone help you when you read instructions, pamphlets, or other written materials from your doctor or pharmacy?: 1 - Never  Diabetic?No  Interpreter Needed?: No  Information entered by ::  Charlott Rakes, LPN   Activities of Daily Living In your present state of health, do you have any difficulty performing the following activities: 01/14/2021  Hearing? Y  Comment wears hearing device in right ear  Vision? N  Difficulty concentrating or making decisions? Y  Comment AT TIMES  Walking or climbing stairs? N  Dressing or bathing? N  Doing errands, shopping? N  Preparing Food and eating ? N  Using the Toilet? N  In the past six months, have you accidently leaked urine? Y  Comment leaking at times  Do you have problems with loss of bowel control? N  Managing your Medications? N  Managing your Finances? N  Housekeeping or managing your Housekeeping? N  Some recent data might be hidden    Patient Care Team: Vivi Barrack, MD as PCP - General (Family Medicine) Ronald Lobo, MD as Consulting Physician (Gastroenterology) Derrek Gu as Consulting Physician (Dentistry) Marygrace Drought, MD as Consulting Physician (Ophthalmology) Mylinda Latina as Consulting Physician (Ophthalmology) Johna Roles, MD as Consulting Physician  Indicate any recent Medical Services you may have received from other than [facility] providers in the past year (date may be approximate).     Assessment:   This is a routine wellness examination for Ibrohim.  Hearing/Vision screen  Hearing Screening   125Hz  250Hz  500Hz  1000Hz  2000Hz  3000Hz  4000Hz  6000Hz  8000Hz   Right ear:           Left ear:           Comments: Pt has loss of hearing in right ear and uses device   Vision Screening Comments: Pt follows up annually with dr Satira Sark for eye exams  Dietary issues and exercise activities discussed: Current Exercise Habits: Home exercise routine, Type of exercise: walking;Other - see comments (yard work up to 4 hours), Time (Minutes): 60, Frequency (Times/Week): 7, Weekly Exercise (Minutes/Week): 420  Goals    . Patient Stated     Lose 15- 20 lbs before summer    . Weight (lb) < 185 lb (83.9 kg)      Will cut back on sugar  Check out  online nutrition programs as GumSearch.nl and http://vang.com/; fit38m; Look for foods with "whole" wheat; bran; oatmeal etc Shot at the farmer's markets in season for fresher choices  Watch for "hydrogenated" on the label of oils which are trans-fats.  Watch for "high fructose corn syrup" in snacks, yogurt or ketchup  Meats have less marbling; bright colored fruits and vegetables;  Canned; dump out liquid and wash vegetables. Be mindful of what we are eating  Portion control is essential to a health weight! Sit down; take a break and enjoy your meal; take smaller bites; put the fork down between bites;  It takes 20 minutes to get full; so check in with your fullness cues and stop eating when you start to fill full             Depression Screen PHQ 2/9 Scores 01/14/2021 11/23/2019 07/22/2018 07/21/2017 06/01/2017  PHQ - 2 Score 0 0 0 0 0    Fall Risk Fall Risk  01/14/2021 07/22/2018 07/21/2017 06/01/2017  Falls in the past year? 1 Yes No No  Number falls in past yr: 1 1 - -  Injury with Fall? 0 - - -  Risk for fall due to : Impaired vision;Impaired balance/gait Other (Comment) - -  Risk for fall due to: Comment - drinking  - -  Follow up Falls prevention discussed Education provided - -    FALL RISK PREVENTION PERTAINING TO THE HOME:  Any stairs in or around the home? Yes  If so, are there any without handrails? No  Home free of loose throw rugs in walkways, pet beds, electrical cords, etc? Yes  Adequate lighting in your home to reduce risk of falls? Yes   ASSISTIVE DEVICES UTILIZED TO PREVENT FALLS:  Life alert? No  Use of a cane, walker or w/c? No  Grab bars in the bathroom? No  Shower chair or bench in shower? Yes  Elevated toilet seat or a handicapped toilet? No   TIMED UP AND GO:  Was the test performed? Yes .  Length of time to  ambulate 10 feet: 10 sec.   Gait steady and fast without use of assistive device  Cognitive  Function: MMSE - Mini Mental State Exam 07/22/2018  Not completed: (No Data)     6CIT Screen 01/14/2021  What Year? 0 points  What month? 0 points  Count back from 20 0 points  Months in reverse 0 points  Repeat phrase 0 points    Immunizations Immunization History  Administered Date(s) Administered  . Fluad Quad(high Dose 65+) 08/02/2019  . Hep A / Hep B 03/03/2013  . Hepatitis A 02/25/2013, 03/03/2013, 03/10/2013, 03/09/2014  . Hepatitis B 02/25/2013, 03/03/2013, 03/10/2013, 03/09/2014  . Influenza, High Dose Seasonal PF 08/30/2018  . Influenza-Unspecified 08/22/2010, 09/09/2011, 09/25/2012, 09/18/2014, 09/30/2017, 08/30/2018, 08/03/2019, 09/03/2020  . Moderna SARS-COV2 Booster Vaccination 10/29/2020  . PFIZER(Purple Top)SARS-COV-2 Vaccination 01/02/2020, 01/09/2020  . Td 06/22/2002  . Tdap 11/12/2009    TDAP status: Due, Education has been provided regarding the importance of this vaccine. Advised may receive this vaccine at local pharmacy or Health Dept. Aware to provide a copy of the vaccination record if obtained from local pharmacy or Health Dept. Verbalized acceptance and understanding.  Flu Vaccine status: Up to date  Done 09/03/20 Pneumococcal vaccine status: Declined,  Education has been provided regarding the importance of this vaccine but patient still declined. Advised may receive this vaccine at local pharmacy or Health Dept. Aware to provide a copy of the vaccination record if obtained from local pharmacy or Health Dept. Verbalized acceptance and understanding.   Covid-19 vaccine status: Completed vaccines booster recommended   Qualifies for Shingles Vaccine? Yes   Zostavax completed No   Shingrix Completed?: No.    Education has been provided regarding the importance of this vaccine. Patient has been advised to call insurance company to determine out of pocket expense if they have not yet received this vaccine. Advised may also receive vaccine at local pharmacy or  Health Dept. Verbalized acceptance and understanding.  Screening Tests Health Maintenance  Topic Date Due  . TETANUS/TDAP  11/13/2019  . PNA vac Low Risk Adult (1 of 2 - PCV13) 01/14/2022 (Originally 07/15/2015)  . COVID-19 Vaccine (4 - Booster) 04/28/2021  . COLONOSCOPY (Pts 45-33yr Insurance coverage will need to be confirmed)  11/24/2021  . INFLUENZA VACCINE  Completed  . Hepatitis C Screening  Completed    Health Maintenance  Health Maintenance Due  Topic Date Due  . TETANUS/TDAP  11/13/2019    Colorectal cancer screening: Type of screening: Colonoscopy. Completed 11/24/18. Repeat every 3 years  Additional Screening:  Hepatitis C Screening: Completed 06/01/17  Vision Screening: Recommended annual ophthalmology exams for early detection of glaucoma and other disorders of the eye. Is the patient up to date with their annual eye exam?  Yes  Who is the provider or what is the name of the office in which the patient attends annual eye exams? Dr TSatira Sark   Dental Screening: Recommended annual dental exams for proper oral hygiene  Community Resource Referral / Chronic Care Management: CRR required this visit?  No   CCM required this visit?  No      Plan:     I have personally reviewed and noted the following in the patient's chart:   . Medical and social history . Use of alcohol, tobacco or illicit drugs  . Current medications and supplements . Functional ability and status . Nutritional status . Physical activity . Advanced directives . List of other physicians . Hospitalizations, surgeries, and ER visits in  previous 12 months . Vitals . Screenings to include cognitive, depression, and falls . Referrals and appointments  In addition, I have reviewed and discussed with patient certain preventive protocols, quality metrics, and best practice recommendations. A written personalized care plan for preventive services as well as general preventive health recommendations  were provided to patient.     Willette Brace, LPN   08/10/8181   Nurse Notes: None

## 2021-01-14 NOTE — Patient Instructions (Addendum)
Shaun Armstrong , Thank you for taking time to come for your Medicare Wellness Visit. I appreciate your ongoing commitment to your health goals. Please review the following plan we discussed and let me know if I can assist you in the future.   Screening recommendations/referrals: Colonoscopy: Done 11/24/18 Recommended yearly ophthalmology/optometry visit for glaucoma screening and checkup Recommended yearly dental visit for hygiene and checkup  Vaccinations: Influenza vaccine: Up to date Done 09/03/20 Pneumococcal vaccine: declined and discussed Tdap vaccine: Due and discussed Shingles vaccine: Shingrix discussed. Please contact your pharmacy for coverage information.    Covid-19: Completed 01/02/20 & 01/09/20 Booster 10/29/20  Advanced directives: Please bring a copy of your health care power of attorney and living will to the office at your convenience.  Conditions/risks identified: Lose 15-20 lbs before summer  Next appointment: Follow up in one year for your annual wellness visit.   Preventive Care 39 Years and Older, Male Preventive care refers to lifestyle choices and visits with your health care provider that can promote health and wellness. What does preventive care include?  A yearly physical exam. This is also called an annual well check.  Dental exams once or twice a year.  Routine eye exams. Ask your health care provider how often you should have your eyes checked.  Personal lifestyle choices, including:  Daily care of your teeth and gums.  Regular physical activity.  Eating a healthy diet.  Avoiding tobacco and drug use.  Limiting alcohol use.  Practicing safe sex.  Taking low doses of aspirin every day.  Taking vitamin and mineral supplements as recommended by your health care provider. What happens during an annual well check? The services and screenings done by your health care provider during your annual well check will depend on your age, overall health,  lifestyle risk factors, and family history of disease. Counseling  Your health care provider may ask you questions about your:  Alcohol use.  Tobacco use.  Drug use.  Emotional well-being.  Home and relationship well-being.  Sexual activity.  Eating habits.  History of falls.  Memory and ability to understand (cognition).  Work and work Statistician. Screening  You may have the following tests or measurements:  Height, weight, and BMI.  Blood pressure.  Lipid and cholesterol levels. These may be checked every 5 years, or more frequently if you are over 53 years old.  Skin check.  Lung cancer screening. You may have this screening every year starting at age 77 if you have a 30-pack-year history of smoking and currently smoke or have quit within the past 15 years.  Fecal occult blood test (FOBT) of the stool. You may have this test every year starting at age 63.  Flexible sigmoidoscopy or colonoscopy. You may have a sigmoidoscopy every 5 years or a colonoscopy every 10 years starting at age 23.  Prostate cancer screening. Recommendations will vary depending on your family history and other risks.  Hepatitis C blood test.  Hepatitis B blood test.  Sexually transmitted disease (STD) testing.  Diabetes screening. This is done by checking your blood sugar (glucose) after you have not eaten for a while (fasting). You may have this done every 1-3 years.  Abdominal aortic aneurysm (AAA) screening. You may need this if you are a current or former smoker.  Osteoporosis. You may be screened starting at age 24 if you are at high risk. Talk with your health care provider about your test results, treatment options, and if necessary, the need for  more tests. Vaccines  Your health care provider may recommend certain vaccines, such as:  Influenza vaccine. This is recommended every year.  Tetanus, diphtheria, and acellular pertussis (Tdap, Td) vaccine. You may need a Td booster  every 10 years.  Zoster vaccine. You may need this after age 46.  Pneumococcal 13-valent conjugate (PCV13) vaccine. One dose is recommended after age 2.  Pneumococcal polysaccharide (PPSV23) vaccine. One dose is recommended after age 62. Talk to your health care provider about which screenings and vaccines you need and how often you need them. This information is not intended to replace advice given to you by your health care provider. Make sure you discuss any questions you have with your health care provider. Document Released: 12/21/2015 Document Revised: 08/13/2016 Document Reviewed: 09/25/2015 Elsevier Interactive Patient Education  2017 [employer] Prevention in the Home Falls can cause injuries. They can happen to people of all ages. There are many things you can do to make your home safe and to help prevent falls. What can I do on the outside of my home?  Regularly fix the edges of walkways and driveways and fix any cracks.  Remove anything that might make you trip as you walk through a door, such as a raised step or threshold.  Trim any bushes or trees on the path to your home.  Use bright outdoor lighting.  Clear any walking paths of anything that might make someone trip, such as rocks or tools.  Regularly check to see if handrails are loose or broken. Make sure that both sides of any steps have handrails.  Any raised decks and porches should have guardrails on the edges.  Have any leaves, snow, or ice cleared regularly.  Use sand or salt on walking paths during winter.  Clean up any spills in your garage right away. This includes oil or grease spills. What can I do in the bathroom?  Use night lights.  Install grab bars by the toilet and in the tub and shower. Do not use towel bars as grab bars.  Use non-skid mats or decals in the tub or shower.  If you need to sit down in the shower, use a plastic, non-slip stool.  Keep the floor dry. Clean up any  water that spills on the floor as soon as it happens.  Remove soap buildup in the tub or shower regularly.  Attach bath mats securely with double-sided non-slip rug tape.  Do not have throw rugs and other things on the floor that can make you trip. What can I do in the bedroom?  Use night lights.  Make sure that you have a light by your bed that is easy to reach.  Do not use any sheets or blankets that are too big for your bed. They should not hang down onto the floor.  Have a firm chair that has side arms. You can use this for support while you get dressed.  Do not have throw rugs and other things on the floor that can make you trip. What can I do in the kitchen?  Clean up any spills right away.  Avoid walking on wet floors.  Keep items that you use a lot in easy-to-reach places.  If you need to reach something above you, use a strong step stool that has a grab bar.  Keep electrical cords out of the way.  Do not use floor polish or wax that makes floors slippery. If you must use wax, use non-skid  floor wax.  Do not have throw rugs and other things on the floor that can make you trip. What can I do with my stairs?  Do not leave any items on the stairs.  Make sure that there are handrails on both sides of the stairs and use them. Fix handrails that are broken or loose. Make sure that handrails are as long as the stairways.  Check any carpeting to make sure that it is firmly attached to the stairs. Fix any carpet that is loose or worn.  Avoid having throw rugs at the top or bottom of the stairs. If you do have throw rugs, attach them to the floor with carpet tape.  Make sure that you have a light switch at the top of the stairs and the bottom of the stairs. If you do not have them, ask someone to add them for you. What else can I do to help prevent falls?  Wear shoes that:  Do not have high heels.  Have rubber bottoms.  Are comfortable and fit you well.  Are closed  at the toe. Do not wear sandals.  If you use a stepladder:  Make sure that it is fully opened. Do not climb a closed stepladder.  Make sure that both sides of the stepladder are locked into place.  Ask someone to hold it for you, if possible.  Clearly mark and make sure that you can see:  Any grab bars or handrails.  First and last steps.  Where the edge of each step is.  Use tools that help you move around (mobility aids) if they are needed. These include:  Canes.  Walkers.  Scooters.  Crutches.  Turn on the lights when you go into a dark area. Replace any light bulbs as soon as they burn out.  Set up your furniture so you have a clear path. Avoid moving your furniture around.  If any of your floors are uneven, fix them.  If there are any pets around you, be aware of where they are.  Review your medicines with your doctor. Some medicines can make you feel dizzy. This can increase your chance of falling. Ask your doctor what other things that you can do to help prevent falls. This information is not intended to replace advice given to you by your health care provider. Make sure you discuss any questions you have with your health care provider. Document Released: 09/20/2009 Document Revised: 05/01/2016 Document Reviewed: 12/29/2014 Elsevier Interactive Patient Education  2017 [employer].

## 2021-01-17 DIAGNOSIS — L853 Xerosis cutis: Secondary | ICD-10-CM | POA: Diagnosis not present

## 2021-01-17 DIAGNOSIS — D1801 Hemangioma of skin and subcutaneous tissue: Secondary | ICD-10-CM | POA: Diagnosis not present

## 2021-01-17 DIAGNOSIS — L820 Inflamed seborrheic keratosis: Secondary | ICD-10-CM | POA: Diagnosis not present

## 2021-01-17 DIAGNOSIS — L570 Actinic keratosis: Secondary | ICD-10-CM | POA: Diagnosis not present

## 2021-01-17 DIAGNOSIS — L821 Other seborrheic keratosis: Secondary | ICD-10-CM | POA: Diagnosis not present

## 2021-01-17 DIAGNOSIS — L814 Other melanin hyperpigmentation: Secondary | ICD-10-CM | POA: Diagnosis not present

## 2021-01-21 MED FILL — XARELTO 20 MG TABLET: 20 | 30 days supply | Qty: 30 | Fill #2

## 2021-02-19 ENCOUNTER — Other Ambulatory Visit: Payer: Self-pay | Admitting: Family Medicine

## 2021-02-19 ENCOUNTER — Other Ambulatory Visit: Payer: Self-pay | Admitting: Physician Assistant

## 2021-02-19 NOTE — Telephone Encounter (Signed)
Is this okay to be refilled? You are not the original doctor who prescribed medication.

## 2021-03-08 ENCOUNTER — Ambulatory Visit (INDEPENDENT_AMBULATORY_CARE_PROVIDER_SITE_OTHER): Payer: PPO | Admitting: Family Medicine

## 2021-03-08 ENCOUNTER — Encounter: Payer: Self-pay | Admitting: Family Medicine

## 2021-03-08 ENCOUNTER — Other Ambulatory Visit: Payer: Self-pay

## 2021-03-08 VITALS — BP 122/74 | HR 50 | Temp 97.4°F | Ht 69.0 in | Wt 200.8 lb

## 2021-03-08 DIAGNOSIS — I824Y1 Acute embolism and thrombosis of unspecified deep veins of right proximal lower extremity: Secondary | ICD-10-CM

## 2021-03-08 DIAGNOSIS — G250 Essential tremor: Secondary | ICD-10-CM | POA: Diagnosis not present

## 2021-03-08 DIAGNOSIS — E785 Hyperlipidemia, unspecified: Secondary | ICD-10-CM | POA: Diagnosis not present

## 2021-03-08 DIAGNOSIS — Z125 Encounter for screening for malignant neoplasm of prostate: Secondary | ICD-10-CM | POA: Diagnosis not present

## 2021-03-08 DIAGNOSIS — I2699 Other pulmonary embolism without acute cor pulmonale: Secondary | ICD-10-CM

## 2021-03-08 DIAGNOSIS — Z0001 Encounter for general adult medical examination with abnormal findings: Secondary | ICD-10-CM

## 2021-03-08 DIAGNOSIS — K5080 Crohn's disease of both small and large intestine without complications: Secondary | ICD-10-CM | POA: Diagnosis not present

## 2021-03-08 DIAGNOSIS — E210 Primary hyperparathyroidism: Secondary | ICD-10-CM

## 2021-03-08 DIAGNOSIS — R739 Hyperglycemia, unspecified: Secondary | ICD-10-CM

## 2021-03-08 LAB — COMPREHENSIVE METABOLIC PANEL
ALT: 26 U/L (ref 0–53)
AST: 34 U/L (ref 0–37)
Albumin: 4.2 g/dL (ref 3.5–5.2)
Alkaline Phosphatase: 66 U/L (ref 39–117)
BUN: 19 mg/dL (ref 6–23)
CO2: 28 mEq/L (ref 19–32)
Calcium: 11.2 mg/dL — ABNORMAL HIGH (ref 8.4–10.5)
Chloride: 109 mEq/L (ref 96–112)
Creatinine, Ser: 0.97 mg/dL (ref 0.40–1.50)
GFR: 79.02 mL/min (ref >60.00)
Glucose, Bld: 85 mg/dL (ref 70–99)
Potassium: 4.4 mEq/L (ref 3.5–5.1)
Sodium: 144 mEq/L (ref 135–145)
Total Bilirubin: 0.6 mg/dL (ref 0.2–1.2)
Total Protein: 6.3 g/dL (ref 6.0–8.3)

## 2021-03-08 LAB — LIPID PANEL
Cholesterol: 162 mg/dL (ref 0–200)
HDL: 34.4 mg/dL — ABNORMAL LOW (ref >39.00)
LDL Cholesterol: 103 mg/dL — ABNORMAL HIGH (ref 0–99)
NonHDL: 127.71
Total CHOL/HDL Ratio: 5
Triglycerides: 123 mg/dL (ref 0.0–149.0)
VLDL: 24.6 mg/dL (ref 0.0–40.0)

## 2021-03-08 LAB — CBC
HCT: 45.3 % (ref 39.0–52.0)
Hemoglobin: 15 g/dL (ref 13.0–17.0)
MCHC: 33 g/dL (ref 30.0–36.0)
MCV: 89.9 fl (ref 78.0–100.0)
Platelets: 171 10*3/uL (ref 150.0–400.0)
RBC: 5.04 Mil/uL (ref 4.22–5.81)
RDW: 13.8 % (ref 11.5–15.5)
WBC: 6 10*3/uL (ref 4.0–10.5)

## 2021-03-08 LAB — HEMOGLOBIN A1C: Hgb A1c MFr Bld: 5.8 % (ref 4.6–6.5)

## 2021-03-08 LAB — PSA: PSA: 2.83 ng/mL (ref 0.10–4.00)

## 2021-03-08 LAB — TSH: TSH: 2.54 u[IU]/mL (ref 0.35–4.50)

## 2021-03-08 NOTE — Assessment & Plan Note (Signed)
Check labs today.  He is following with endocrinology.

## 2021-03-08 NOTE — Progress Notes (Signed)
Please inform patient of the following:  Labs are stable. Do not need to make any changes to his treatment plan at this time. Would like for him to keep working on diet and exercise and we can recheck in a year.  Algis Greenhouse. [name], MD 03/08/2021 3:28 PM

## 2021-03-08 NOTE — Assessment & Plan Note (Signed)
On propranolol 40 mg daily.  Tolerating well.

## 2021-03-08 NOTE — Assessment & Plan Note (Signed)
On Crestor 10 mg daily.  Will check labs today.

## 2021-03-08 NOTE — Progress Notes (Signed)
Chief Complaint:  Shaun Armstrong is a 70 y.o. male who presents today for his annual comprehensive physical exam.    Assessment/Plan:  Chronic Problems Addressed Today: Acute deep vein thrombosis (DVT) of proximal vein of right lower extremity ([place]) Discussed with patient that he will need to be on lifelong anticoagulation.  We will continue Xarelto.  Check labs today.  Bilateral pulmonary embolism ([place]) See above.  Patient will need to stay on indefinite anticoagulation given his recurrent DVT and PE.  He is on Xarelto 20 mg daily.  Hypercalcemia Check labs today.  He is following with endocrinology.  Hyperparathyroidism, primary [facility]) Has had endocrinology work-up.  They recommended routine calcium monitoring at this point.  Hyperlipidemia On Crestor 10 mg daily.  Will check labs today.  Essential tremor On propranolol 40 mg daily.  Tolerating well.  Crohn's disease of both small and large intestine without complication ([place]) Managed by GI.  On Lialda 2.4 g daily.  Will need repeat colonoscopy later this year.  Preventative Healthcare: Check labs today.  Colonoscopy later this year.  Up-to-date on vaccines.  Patient Counseling(The following topics were reviewed and/or handout was given):  -Nutrition: Stressed importance of moderation in sodium/caffeine intake, saturated fat and cholesterol, caloric balance, sufficient intake of fresh fruits, vegetables, and fiber.  -Stressed the importance of regular exercise.   -Substance Abuse: Discussed cessation/primary prevention of tobacco, alcohol, or other drug use; driving or other dangerous activities under the influence; availability of treatment for abuse.   -Injury prevention: Discussed safety belts, safety helmets, smoke detector, smoking near bedding or upholstery.   -Sexuality: Discussed sexually transmitted diseases, partner selection, use of condoms, avoidance of unintended pregnancy and contraceptive alternatives.   -Dental  health: Discussed importance of regular tooth brushing, flossing, and dental visits.  -Health maintenance and immunizations reviewed. Please refer to Health maintenance section.  Return to care in 1 year for next preventative visit.     Subjective:  HPI:  He has no acute complaints today.   Lifestyle Diet: Balanced. Plenty of fruits and vegetables.  Exercise: Plenty of exercise doing stuff around the house. Likes to walk.   Depression screen PHQ 2/9 01/14/2021  Decreased Interest 0  Down, Depressed, Hopeless 0  PHQ - 2 Score 0    Health Maintenance Due  Topic Date Due  . TETANUS/TDAP  11/13/2019     ROS: Per HPI, otherwise a complete review of systems was negative.   PMH:  The following were reviewed and entered/updated in epic: Past Medical History:  Diagnosis Date  . 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 Crohn's disease ([place])   . Deafness in right ear, wears a microphone   . Decreased peripheral vision 11/04/2011  . Essential tremor   . HLD (hyperlipidemia)   . Inguinal hernia 07/2016  . Inguinal hernia   . Retinal detachment   . Wears hearing aid, left ear    Patient Active Problem List   Diagnosis Date Noted  . Bilateral pulmonary embolism ([place]) 10/05/2020  . Acute deep vein thrombosis (DVT) of proximal vein of right lower extremity ([place]) 10/05/2020  . Hyperparathyroidism, primary ([place]) 06/07/2020  . Hypercalcemia 06/07/2020  . Meralgia paresthetica of right side 10/25/2018  . Gastroesophageal reflux disease without esophagitis 10/25/2018  . Sun-damaged skin 08/03/2017  . Essential tremor 06/05/2016  . 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 Crohn's disease of both small and large intestine without complication ([place]) 56/38/7564  . Hyperlipidemia 10/05/2012  . Decreased peripheral vision 11/04/2011  Past Surgical History:  Procedure Laterality Date  . ACOUSTIC NEUROMA RESECTION Right   . BROW LIFT AND BLEPHAROPLASTY Bilateral 10/11/2012  . CATARACT EXTRACTION W/  INTRAOCULAR LENS  IMPLANT, BILATERAL Bilateral   . HERNIA REPAIR    . INGUINAL HERNIA REPAIR Right 07/16/2016   Procedure: LAPAROSCOPIC RIGHT INGUINAL HERNIA;  Surgeon: Coralie Keens, MD;  Location: [place];  Service: General;  Laterality: Right;  . INSERTION OF MESH Right 07/16/2016   Procedure: INSERTION OF MESH;  Surgeon: Coralie Keens, MD;  Location: [place];  Service: General;  Laterality: Right;  . RETINAL DETACHMENT SURGERY Left   . TONSILLECTOMY      Family History  Problem Relation Age of Onset  . Diabetes Mother   . Dementia Mother   . Diabetes Sister   . Hyperparathyroidism Neg Hx     Medications- reviewed and updated Current Outpatient Medications  Medication Sig Dispense Refill  . Cholecalciferol (VITAMIN D) 125 MCG (5000 UT) CAPS Take 1 capsule by mouth every 3 (three) days.    Marland Kitchen glucosamine-chondroitin 500-400 MG tablet Take 1 tablet by mouth daily.     . mesalamine (LIALDA) 1.2 g EC tablet Take 2.4 g by mouth daily with breakfast.    . Multiple Vitamin (MULTIVITAMIN) tablet Take 1 tablet by mouth daily.    . Omega-3 Fatty Acids (OMEGA 3 PO) Take 2 capsules by mouth daily.    Vladimir Faster Glycol-Propyl Glycol (SYSTANE FREE OP) Place 2 drops into both eyes 5 (five) times daily as needed (dry eyes).    . Probiotic Product (ALIGN) 4 MG CAPS 1 tablet    . propranolol (INDERAL) 20 MG tablet TAKE 2 TABLETS (40 MG TOTAL) BY MOUTH DAILY. 180 tablet 3  . rosuvastatin (CRESTOR) 20 MG tablet TAKE 1 TABLET BY MOUTH ONCE DAILY 90 tablet 0  . XARELTO 20 MG TABS tablet TAKE 1 TABLET (20 MG TOTAL) BY MOUTH DAILY WITH SUPPER. 30 tablet 0   No current facility-administered medications for this visit.    Allergies-reviewed and updated Allergies  Allergen Reactions  . Ceftin [Cefuroxime] Other (See Comments)    Severe dehydration  . Poison Ivy Extract Rash    Social History   Socioeconomic History  . Marital status: Married    Spouse  name: Not on file  . Number of children: Not on file  . Years of education: Not on file  . Highest education level: Not on file  Occupational History  . Occupation: retired  Tobacco Use  . Smoking status: Former Smoker    Packs/day: 1.00    Years: 4.00    Pack years: 4.00    Types: Cigarettes    Quit date: 12/09/1979    Years since quitting: 41.2  . Smokeless tobacco: Never Used  . Tobacco comment: Quit 38 years ago  Vaping Use  . Vaping Use: Never used  Substance and Sexual Activity  . Alcohol use: Yes    Comment: recent ER visit fall after having a few beers  . Drug use: No  . Sexual activity: Not on file  Other Topics Concern  . Not on file  Social History Narrative  . Not on file   Social Determinants of Health   Financial Resource Strain: Low Risk   . Difficulty of Paying Living Expenses: Not hard at all  Food Insecurity: No Food Insecurity  . Worried About Charity fundraiser in the Last Year: Never true  . Ran Out of Food in  the Last Year: Never true  Transportation Needs: No Transportation Needs  . Lack of Transportation (Medical): No  . Lack of Transportation (Non-Medical): No  Physical Activity: Sufficiently Active  . Days of Exercise per Week: 7 days  . Minutes of Exercise per Session: 60 min  Stress: No Stress Concern Present  . Feeling of Stress : Not at all  Social Connections: Moderately Integrated  . Frequency of Communication with Friends and Family: More than three times a week  . Frequency of Social Gatherings with Friends and Family: More than three times a week  . Attends Religious Services: 1 to 4 times per year  . Active Member of Clubs or Organizations: No  . Attends Archivist Meetings: Never  . Marital Status: Married        Objective:  Physical Exam: BP 122/74   Pulse (!) 50   Temp (!) 97.4 F (36.3 C) (Temporal)   Ht 5' 9"  (1.753 m)   Wt 200 lb 12.8 oz (91.1 kg)   SpO2 98%   BMI 29.65 kg/m   Body mass index is 29.65  kg/m. Wt Readings from Last 3 Encounters:  03/08/21 200 lb 12.8 oz (91.1 kg)  01/14/21 203 lb 9.6 oz (92.4 kg)  10/10/20 200 lb 6.4 oz (90.9 kg)   Gen: NAD, resting comfortably HEENT: TMs normal bilaterally. OP clear. No thyromegaly noted.  CV: RRR with no murmurs appreciated Pulm: NWOB, CTAB with no crackles, wheezes, or rhonchi GI: Normal bowel sounds present. Soft, Nontender, Nondistended. MSK: no edema, cyanosis, or clubbing noted Skin: warm, dry Neuro: CN2-12 grossly intact. Strength 5/5 in upper and lower extremities. Reflexes symmetric and intact bilaterally.  Psych: Normal affect and thought content     Hervey Wedig M. [name], MD 03/08/2021 10:24 AM

## 2021-03-08 NOTE — Assessment & Plan Note (Signed)
Managed by GI.  On Lialda 2.4 g daily.  Will need repeat colonoscopy later this year.

## 2021-03-08 NOTE — Assessment & Plan Note (Signed)
See above.  Patient will need to stay on indefinite anticoagulation given his recurrent DVT and PE.  He is on Xarelto 20 mg daily.

## 2021-03-08 NOTE — Patient Instructions (Signed)
It was very nice to see you today!  We will check blood work today.  We should keep you on your blood thinner indefinitely at this point.  I will see you back in year for your next physical.  Come back to see me sooner if needed.  Take care, Dr [name]  PLEASE NOTE:  If you had any lab tests please let us know if you have not heard back within a few days. You may see your results on mychart before we have a chance to review them but we will give you a call once they are reviewed by [place]. If we ordered any referrals today, please let us know if you have not heard from their office within the next week.   Please try these tips to maintain a healthy lifestyle:   Eat at least 3 REAL meals and 1-2 snacks per day.  Aim for no more than 5 hours between eating.  If you eat breakfast, please do so within one hour of getting up.    Each meal should contain half fruits/vegetables, one quarter protein, and one quarter carbs (no bigger than a computer mouse)   Cut down on sweet beverages. This includes juice, soda, and sweet tea.     Drink at least 1 glass of water with each meal and aim for at least 8 glasses per day   Exercise at least 150 minutes every week.    Preventive Care 67 Years and Older, Male Preventive care refers to lifestyle choices and visits with your health care provider that can promote health and wellness. This includes:  A yearly physical exam. This is also called an annual wellness visit.  Regular dental and eye exams.  Immunizations.  Screening for certain conditions.  Healthy lifestyle choices, such as: ? Eating a healthy diet. ? Getting regular exercise. ? Not using drugs or products that contain nicotine and tobacco. ? Limiting alcohol use. What can I expect for my preventive care visit? Physical exam Your health care provider will check your:  Height and weight. These may be used to calculate your BMI (body mass index). BMI is a measurement that tells if  you are at a healthy weight.  Heart rate and blood pressure.  Body temperature.  Skin for abnormal spots. Counseling Your health care provider may ask you questions about your:  Past medical problems.  Family's medical history.  Alcohol, tobacco, and drug use.  Emotional well-being.  Home life and relationship well-being.  Sexual activity.  Diet, exercise, and sleep habits.  History of falls.  Memory and ability to understand (cognition).  Work and work Statistician.  Access to firearms. What immunizations do I need? Vaccines are usually given at various ages, according to a schedule. Your health care provider will recommend vaccines for you based on your age, medical history, and lifestyle or other factors, such as travel or where you work.   What tests do I need? Blood tests  Lipid and cholesterol levels. These may be checked every 5 years, or more often depending on your overall health.  Hepatitis C test.  Hepatitis B test. Screening  Lung cancer screening. You may have this screening every year starting at age 42 if you have a 30-pack-year history of smoking and currently smoke or have quit within the past 15 years.  Colorectal cancer screening. ? All adults should have this screening starting at age 20 and continuing until age 70. ? Your health care provider may recommend screening at age  45 if you are at increased risk. ? You will have tests every 1-10 years, depending on your results and the type of screening test.  Prostate cancer screening. Recommendations will vary depending on your family history and other risks.  Genital exam to check for testicular cancer or hernias.  Diabetes screening. ? This is done by checking your blood sugar (glucose) after you have not eaten for a while (fasting). ? You may have this done every 1-3 years.  Abdominal aortic aneurysm (AAA) screening. You may need this if you are a current or former smoker.  STD (sexually  transmitted disease) testing, if you are at risk. Follow these instructions at home: Eating and drinking  Eat a diet that includes fresh fruits and vegetables, whole grains, lean protein, and low-fat dairy products. Limit your intake of foods with high amounts of sugar, saturated fats, and salt.  Take vitamin and mineral supplements as recommended by your health care provider.  Do not drink alcohol if your health care provider tells you not to drink.  If you drink alcohol: ? Limit how much you have to 0-2 drinks a day. ? Be aware of how much alcohol is in your drink. In the [place] one drink equals one 12 oz bottle of beer (355 mL), one 5 oz glass of wine (148 mL), or one 1 oz glass of hard liquor (44 mL).   Lifestyle  Take daily care of your teeth and gums. Brush your teeth every morning and night with fluoride toothpaste. Floss one time each day.  Stay active. Exercise for at least 30 minutes 5 or more days each week.  Do not use any products that contain nicotine or tobacco, such as cigarettes, e-cigarettes, and chewing tobacco. If you need help quitting, ask your health care provider.  Do not use drugs.  If you are sexually active, practice safe sex. Use a condom or other form of protection to prevent STIs (sexually transmitted infections).  Talk with your health care provider about taking a low-dose aspirin or statin.  Find healthy ways to cope with stress, such as: ? Meditation, yoga, or listening to music. ? Journaling. ? Talking to a trusted person. ? Spending time with friends and family. Safety  Always wear your seat belt while driving or riding in a vehicle.  Do not drive: ? If you have been drinking alcohol. Do not ride with someone who has been drinking. ? When you are tired or distracted. ? While texting.  Wear a helmet and other protective equipment during sports activities.  If you have firearms in your house, make sure you follow all gun safety  procedures. What's next?  Visit your health care provider once a year for an annual wellness visit.  Ask your health care provider how often you should have your eyes and teeth checked.  Stay up to date on all vaccines. This information is not intended to replace advice given to you by your health care provider. Make sure you discuss any questions you have with your health care provider. Document Revised: 08/23/2019 Document Reviewed: 11/18/2018 Elsevier Patient Education  2021 [employer].

## 2021-03-08 NOTE — Assessment & Plan Note (Signed)
Discussed with patient that he will need to be on lifelong anticoagulation.  We will continue Xarelto.  Check labs today.

## 2021-03-08 NOTE — Assessment & Plan Note (Signed)
Has had endocrinology work-up.  They recommended routine calcium monitoring at this point.

## 2021-03-11 ENCOUNTER — Other Ambulatory Visit (HOSPITAL_BASED_OUTPATIENT_CLINIC_OR_DEPARTMENT_OTHER): Payer: Self-pay

## 2021-04-01 ENCOUNTER — Other Ambulatory Visit (HOSPITAL_BASED_OUTPATIENT_CLINIC_OR_DEPARTMENT_OTHER): Payer: Self-pay

## 2021-04-01 ENCOUNTER — Ambulatory Visit (INDEPENDENT_AMBULATORY_CARE_PROVIDER_SITE_OTHER): Payer: PPO | Admitting: Family Medicine

## 2021-04-01 ENCOUNTER — Encounter: Payer: Self-pay | Admitting: Family Medicine

## 2021-04-01 ENCOUNTER — Other Ambulatory Visit: Payer: Self-pay

## 2021-04-01 ENCOUNTER — Other Ambulatory Visit: Payer: Self-pay | Admitting: Family Medicine

## 2021-04-01 VITALS — BP 133/75 | HR 50 | Temp 97.2°F | Ht 69.0 in | Wt 201.6 lb

## 2021-04-01 DIAGNOSIS — E785 Hyperlipidemia, unspecified: Secondary | ICD-10-CM

## 2021-04-01 DIAGNOSIS — K5080 Crohn's disease of both small and large intestine without complications: Secondary | ICD-10-CM | POA: Diagnosis not present

## 2021-04-01 DIAGNOSIS — M7742 Metatarsalgia, left foot: Secondary | ICD-10-CM | POA: Diagnosis not present

## 2021-04-01 DIAGNOSIS — Z23 Encounter for immunization: Secondary | ICD-10-CM

## 2021-04-01 DIAGNOSIS — I2699 Other pulmonary embolism without acute cor pulmonale: Secondary | ICD-10-CM | POA: Diagnosis not present

## 2021-04-01 MED ORDER — ROSUVASTATIN CALCIUM 20 MG PO TABS
ORAL_TABLET | Freq: Every day | ORAL | 0 refills | Status: DC
  Filled 2021-04-01: qty 90, 90d supply, fill #0

## 2021-04-01 MED ORDER — RIVAROXABAN 20 MG PO TABS
ORAL_TABLET | Freq: Every day | ORAL | 0 refills | Status: DC
  Filled 2021-04-01: qty 30, 30d supply, fill #0

## 2021-04-01 MED FILL — Propranolol HCl Tab 20 MG: ORAL | 90 days supply | Qty: 180 | Fill #0 | Status: AC

## 2021-04-01 NOTE — Assessment & Plan Note (Signed)
Managed by GI.  Will have colonoscopy soon.  Continue Lialda 2.4 g daily.

## 2021-04-01 NOTE — Addendum Note (Signed)
Addended by: Betti Cruz on: 04/01/2021 01:36 PM   Modules accepted: Orders

## 2021-04-01 NOTE — Assessment & Plan Note (Signed)
He has on Xarelto 20 mg daily.  He will be having upcoming colonoscopy.  We will be fine for him to have brief pause and anticoagulation for this procedure.

## 2021-04-01 NOTE — Progress Notes (Addendum)
   Shaun Armstrong is a 70 y.o. male who presents today for an office visit.  Assessment/Plan:  New/Acute Problems: Metatarsalgia No red flags.  Reassured patient.  Discussed importance of good footwear.  Given that symptoms are improving do not think we need to do any further management at this point.  He will let me know symptoms persist and we can refer him to see sports medicine.  Chronic Problems Addressed Today: Bilateral pulmonary embolism (HCC) He has on Xarelto 20 mg daily.  He will be having upcoming colonoscopy.  We will be fine for him to have brief pause and anticoagulation for this procedure.  Crohn's disease of both small and large intestine without complication ([place]) Managed by GI.  Will have colonoscopy soon.  Continue Lialda 2.4 g daily.  Preventative Healthcare tdap given today.    Subjective:  HPI:  Patient here with left foot Armstrong for the last 3 days. Had sudden onset Armstrong in the midle of his foot while stretching. No treatments tried.  Symptoms seem to be improving.       Objective:  Physical Exam: BP 133/75   Pulse (!) 50   Temp (!) 97.2 F (36.2 C) (Temporal)   Ht 5' 9"  (1.753 m)   Wt 201 lb 9.6 oz (91.4 kg)   SpO2 99%   BMI 29.77 kg/m   Gen: No acute distress, resting comfortably MSK: Left foot without deformities.  Very mild tenderness to palpation along base of fifth metatarsal.  Loss of transverse arch noted.  Longitudinal arch intact. Neuro: Grossly normal, moves all extremities Psych: Normal affect and thought content      Shaun Berthold M. Shaun Pain, MD 04/01/2021 1:31 PM

## 2021-04-01 NOTE — Patient Instructions (Signed)
It was very nice to see you today!  You have pain in the bones in your foot, metatarsals.  This is usually due to the loss of the rach in your foot.   Please make sure you continue to ear good footwear.  Please let me know if your symptoms do not continue to improve.  Take care, Dr [name]  PLEASE NOTE:  If you had any lab tests please let us know if you have not heard back within a few days. You may see your results on mychart before we have a chance to review them but we will give you a call once they are reviewed by [place]. If we ordered any referrals today, please let us know if you have not heard from their office within the next week.   Please try these tips to maintain a healthy lifestyle:   Eat at least 3 REAL meals and 1-2 snacks per day.  Aim for no more than 5 hours between eating.  If you eat breakfast, please do so within one hour of getting up.    Each meal should contain half fruits/vegetables, one quarter protein, and one quarter carbs (no bigger than a computer mouse)   Cut down on sweet beverages. This includes juice, soda, and sweet tea.     Drink at least 1 glass of water with each meal and aim for at least 8 glasses per day   Exercise at least 150 minutes every week.

## 2021-04-02 DIAGNOSIS — K5090 Crohn's disease, unspecified, without complications: Secondary | ICD-10-CM | POA: Diagnosis not present

## 2021-04-29 ENCOUNTER — Other Ambulatory Visit: Payer: Self-pay | Admitting: Family Medicine

## 2021-04-29 ENCOUNTER — Other Ambulatory Visit (HOSPITAL_BASED_OUTPATIENT_CLINIC_OR_DEPARTMENT_OTHER): Payer: Self-pay

## 2021-04-29 MED ORDER — RIVAROXABAN 20 MG PO TABS
ORAL_TABLET | Freq: Every day | ORAL | 0 refills | Status: DC
  Filled 2021-04-29: qty 30, 30d supply, fill #0

## 2021-05-02 ENCOUNTER — Other Ambulatory Visit (HOSPITAL_BASED_OUTPATIENT_CLINIC_OR_DEPARTMENT_OTHER): Payer: Self-pay

## 2021-05-02 MED ORDER — MESALAMINE 1.2 G PO TBEC
DELAYED_RELEASE_TABLET | ORAL | 11 refills | Status: DC
  Filled 2021-05-02: qty 120, 60d supply, fill #0
  Filled 2021-07-23: qty 120, 60d supply, fill #1
  Filled 2021-09-30: qty 120, 60d supply, fill #2
  Filled 2021-11-22: qty 120, 60d supply, fill #3

## 2021-05-15 ENCOUNTER — Other Ambulatory Visit (HOSPITAL_BASED_OUTPATIENT_CLINIC_OR_DEPARTMENT_OTHER): Payer: Self-pay

## 2021-05-15 MED ORDER — PEG 3350-KCL-NA BICARB-NACL 420 G PO SOLR
ORAL | 0 refills | Status: DC
  Filled 2021-05-15: qty 4000, 1d supply, fill #0

## 2021-05-16 ENCOUNTER — Other Ambulatory Visit (HOSPITAL_BASED_OUTPATIENT_CLINIC_OR_DEPARTMENT_OTHER): Payer: Self-pay

## 2021-05-24 DIAGNOSIS — H90A22 Sensorineural hearing loss, unilateral, left ear, with restricted hearing on the contralateral side: Secondary | ICD-10-CM | POA: Diagnosis not present

## 2021-05-24 DIAGNOSIS — Z86018 Personal history of other benign neoplasm: Secondary | ICD-10-CM | POA: Diagnosis not present

## 2021-05-24 DIAGNOSIS — G518 Other disorders of facial nerve: Secondary | ICD-10-CM | POA: Diagnosis not present

## 2021-05-24 DIAGNOSIS — H7201 Central perforation of tympanic membrane, right ear: Secondary | ICD-10-CM | POA: Diagnosis not present

## 2021-05-24 DIAGNOSIS — H6123 Impacted cerumen, bilateral: Secondary | ICD-10-CM | POA: Diagnosis not present

## 2021-05-24 DIAGNOSIS — H903 Sensorineural hearing loss, bilateral: Secondary | ICD-10-CM | POA: Diagnosis not present

## 2021-05-24 DIAGNOSIS — R42 Dizziness and giddiness: Secondary | ICD-10-CM | POA: Diagnosis not present

## 2021-05-24 DIAGNOSIS — H918X1 Other specified hearing loss, right ear: Secondary | ICD-10-CM | POA: Diagnosis not present

## 2021-05-24 DIAGNOSIS — Z9889 Other specified postprocedural states: Secondary | ICD-10-CM | POA: Diagnosis not present

## 2021-05-29 DIAGNOSIS — H748X1 Other specified disorders of right middle ear and mastoid: Secondary | ICD-10-CM | POA: Diagnosis not present

## 2021-05-29 DIAGNOSIS — H73891 Other specified disorders of tympanic membrane, right ear: Secondary | ICD-10-CM | POA: Diagnosis not present

## 2021-06-03 ENCOUNTER — Ambulatory Visit: Payer: PPO | Attending: Internal Medicine

## 2021-06-03 ENCOUNTER — Other Ambulatory Visit (HOSPITAL_BASED_OUTPATIENT_CLINIC_OR_DEPARTMENT_OTHER): Payer: Self-pay

## 2021-06-03 DIAGNOSIS — Z23 Encounter for immunization: Secondary | ICD-10-CM

## 2021-06-03 MED ORDER — COVID-19 MRNA VAC-TRIS(PFIZER) 30 MCG/0.3ML IM SUSP
INTRAMUSCULAR | 0 refills | Status: DC
  Filled 2021-06-03: qty 0.3, 1d supply, fill #0

## 2021-06-03 NOTE — Progress Notes (Signed)
   Covid-19 Vaccination Clinic  Name:  Shaun Armstrong    MRN: [id number] DOB: [birth year]  06/03/2021  [name] was observed post Covid-19 immunization for 15 minutes without incident. He was provided with Vaccine Information Sheet and instruction to access the V-Safe system.   [name] was instructed to call 911 with any severe reactions post vaccine: Difficulty breathing  Swelling of face and throat  A fast heartbeat  A bad rash all over body  Dizziness and weakness   Immunizations Administered     Name Date Dose VIS Date Route   PFIZER Comrnaty(Gray TOP) Covid-19 Vaccine 06/03/2021 12:22 PM 0.3 mL 11/15/2020 Intramuscular   Manufacturer: [place]   Lot: [id number]   [place]: [phone]

## 2021-06-12 ENCOUNTER — Other Ambulatory Visit (HOSPITAL_COMMUNITY): Payer: Self-pay

## 2021-06-13 ENCOUNTER — Other Ambulatory Visit: Payer: Self-pay

## 2021-06-13 ENCOUNTER — Ambulatory Visit (INDEPENDENT_AMBULATORY_CARE_PROVIDER_SITE_OTHER): Payer: PPO | Admitting: Endocrinology

## 2021-06-13 LAB — VITAMIN D 25 HYDROXY (VIT D DEFICIENCY, FRACTURES): VITD: 45.74 ng/mL (ref 30.00–100.00)

## 2021-06-13 NOTE — Patient Instructions (Addendum)
Blood tests are requested for you today.  We'll let you know about the results.  If the parathyroid does not explain the high blood calcium, the next step is to do a 24HR urine collection for calcium. please come back for a follow-up appointment in 1 year.

## 2021-06-13 NOTE — Progress Notes (Signed)
Subjective:    Patient ID: Shaun Armstrong, male    DOB: March 31, 1951, 70 y.o.   MRN: [id number]  HPI Pt returns for f/u of hypercalcemia (dx'ed 2006; prob due to primary hyperparathyroidism; other w/u of hypercalcemia was neg; no h/o osteoporosis or urolithiasis; only bony fracture was right little finger (1970's); DEXA (2021) showed worst T-score was-2.1 ([facility])).  He takes Vit-D, 5000 units TIW.  pt states he feels well in general.  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   Crohn's disease ([place])    Deafness in right ear, wears a microphone    Decreased peripheral vision 11/04/2011   Essential tremor    HLD (hyperlipidemia)    Inguinal hernia 07/2016   Inguinal hernia    Retinal detachment    Wears hearing aid, left ear     Past Surgical History:  Procedure Laterality Date   ACOUSTIC NEUROMA RESECTION Right    BROW LIFT AND BLEPHAROPLASTY Bilateral 10/11/2012   CATARACT EXTRACTION W/ INTRAOCULAR LENS  IMPLANT, BILATERAL Bilateral    HERNIA REPAIR     INGUINAL HERNIA REPAIR Right 07/16/2016   Procedure: LAPAROSCOPIC RIGHT INGUINAL HERNIA;  Surgeon: Coralie Keens, MD;  Location: [facility];  Service: General;  Laterality: Right;   INSERTION OF MESH Right 07/16/2016   Procedure: INSERTION OF MESH;  Surgeon: Coralie Keens, MD;  Location: [facility];  Service: General;  Laterality: Right;   RETINAL DETACHMENT SURGERY Left    TONSILLECTOMY      Social History   Socioeconomic History   Marital status: Married    Spouse name: Not on file   Number of children: Not on file   Years of education: Not on file   Highest education level: Not on file  Occupational History   Occupation: retired  Tobacco Use   Smoking status: Former    Packs/day: 1.00    Years: 4.00    Pack years: 4.00    Types: Cigarettes    Quit date: 12/09/1979    Years since quitting: 41.5   Smokeless tobacco: Never   Tobacco comments:    Quit 38 years ago   Vaping Use   Vaping Use: Never used  Substance and Sexual Activity   Alcohol use: Yes    Comment: recent ER visit fall after having a few beers   Drug use: No   Sexual activity: Not on file  Other Topics Concern   Not on file  Social History Narrative   Not on file   Social Determinants of Health   Financial Resource Strain: Low Risk    Difficulty of Paying Living Expenses: Not hard at all  Food Insecurity: No Food Insecurity   Worried About Charity fundraiser in the Last Year: Never true   Arboriculturist in the Last Year: Never true  Transportation Needs: No Transportation Needs   Lack of Transportation (Medical): No   Lack of Transportation (Non-Medical): No  Physical Activity: Sufficiently Active   Days of Exercise per Week: 7 days   Minutes of Exercise per Session: 60 min  Stress: No Stress Concern Present   Feeling of Stress : Not at all  Social Connections: Moderately Integrated   Frequency of Communication with Friends and Family: More than three times a week   Frequency of Social Gatherings with Friends and Family: More than three times a week   Attends Religious Services: 1 to 4 times per year  Active Member of Clubs or Organizations: No   Attends Archivist Meetings: Never   Marital Status: Married  Human resources officer Violence: Not At Risk   Fear of Current or Ex-Partner: No   Emotionally Abused: No   Physically Abused: No   Sexually Abused: No    Current Outpatient Medications on File Prior to Visit  Medication Sig Dispense Refill   Cholecalciferol (VITAMIN D) 125 MCG (5000 UT) CAPS Take 1 capsule by mouth every 3 (three) days.     COVID-19 mRNA Vac-TriS, Pfizer, SUSP injection Inject into the muscle. 0.3 mL 0   COVID-19 mRNA vaccine, Moderna, 100 MCG/0.5ML injection AS DIRECTED .25 mL 0   glucosamine-chondroitin 500-400 MG tablet Take 1 tablet by mouth daily.      mesalamine (LIALDA) 1.2 g EC tablet Take 2.4 g by mouth daily with breakfast.      mesalamine (LIALDA) 1.2 g EC tablet Take 2 tablets by mouth once daily 120 tablet 11   Multiple Vitamin (MULTIVITAMIN) tablet Take 1 tablet by mouth daily.     Omega-3 Fatty Acids (OMEGA 3 PO) Take 2 capsules by mouth daily.     Polyethyl Glycol-Propyl Glycol (SYSTANE FREE OP) Place 2 drops into both eyes 5 (five) times daily as needed (dry eyes).     polyethylene glycol-electrolytes (TRILYTE) 420 g solution Take as directed by mouth. See prep sheet for instructions. 4000 mL 0   Probiotic Product (ALIGN) 4 MG CAPS 1 tablet     propranolol (INDERAL) 20 MG tablet TAKE TWO TABLETS BY MOUTH DAILY 180 tablet 3   rivaroxaban (XARELTO) 20 MG TABS tablet TAKE 1 TABLET BY MOUTH DAILY WITH SUPPER 30 tablet 0   rosuvastatin (CRESTOR) 20 MG tablet TAKE 1 TABLET BY MOUTH ONCE DAILY 90 tablet 0   No current facility-administered medications on file prior to visit.    Allergies  Allergen Reactions   Ceftin [Cefuroxime] Other (See Comments)    Severe dehydration   Poison Ivy Extract Rash    Family History  Problem Relation Age of Onset   Diabetes Mother    Dementia Mother    Diabetes Sister    Hyperparathyroidism Neg Hx     BP 122/64 (BP Location: Right Arm, Patient Position: Sitting, Cuff Size: Normal)   Pulse (!) 50   Ht 5' 9"  (1.753 m)   Wt 199 lb (90.3 kg)   SpO2 96%   BMI 29.39 kg/m    Review of Systems Denies numbness and muscle cramps.    Objective:   Physical Exam GAIT: normal and steady.     25-OH Vit-D=46    Assessment & Plan:  Vit-D def: well-controlled.  Please continue the same Vit-D. Hypercalcemia: recheck today.

## 2021-06-14 LAB — PTH, INTACT AND CALCIUM
Calcium: 11.4 mg/dL — ABNORMAL HIGH (ref 8.6–10.3)
PTH: 91 pg/mL — ABNORMAL HIGH (ref 16–77)

## 2021-06-18 DIAGNOSIS — D120 Benign neoplasm of cecum: Secondary | ICD-10-CM | POA: Diagnosis not present

## 2021-06-18 DIAGNOSIS — K5010 Crohn's disease of large intestine without complications: Secondary | ICD-10-CM | POA: Diagnosis not present

## 2021-06-18 DIAGNOSIS — K5289 Other specified noninfective gastroenteritis and colitis: Secondary | ICD-10-CM | POA: Diagnosis not present

## 2021-06-18 DIAGNOSIS — K5140 Inflammatory polyps of colon without complications: Secondary | ICD-10-CM | POA: Diagnosis not present

## 2021-06-18 LAB — HM COLONOSCOPY

## 2021-06-19 ENCOUNTER — Other Ambulatory Visit: Payer: Self-pay | Admitting: Family Medicine

## 2021-06-19 ENCOUNTER — Other Ambulatory Visit (HOSPITAL_BASED_OUTPATIENT_CLINIC_OR_DEPARTMENT_OTHER): Payer: Self-pay

## 2021-06-19 MED ORDER — RIVAROXABAN 20 MG PO TABS
ORAL_TABLET | Freq: Every day | ORAL | 2 refills | Status: DC
  Filled 2021-06-19: qty 30, 30d supply, fill #0
  Filled 2021-07-23: qty 30, 30d supply, fill #1
  Filled 2021-08-23: qty 30, 30d supply, fill #2

## 2021-06-21 DIAGNOSIS — K5289 Other specified noninfective gastroenteritis and colitis: Secondary | ICD-10-CM | POA: Diagnosis not present

## 2021-06-21 DIAGNOSIS — D120 Benign neoplasm of cecum: Secondary | ICD-10-CM | POA: Diagnosis not present

## 2021-07-10 DIAGNOSIS — D2271 Melanocytic nevi of right lower limb, including hip: Secondary | ICD-10-CM | POA: Diagnosis not present

## 2021-07-10 DIAGNOSIS — L821 Other seborrheic keratosis: Secondary | ICD-10-CM | POA: Diagnosis not present

## 2021-07-10 DIAGNOSIS — D1801 Hemangioma of skin and subcutaneous tissue: Secondary | ICD-10-CM | POA: Diagnosis not present

## 2021-07-10 DIAGNOSIS — D034 Melanoma in situ of scalp and neck: Secondary | ICD-10-CM | POA: Diagnosis not present

## 2021-07-10 DIAGNOSIS — L813 Cafe au lait spots: Secondary | ICD-10-CM | POA: Diagnosis not present

## 2021-07-10 DIAGNOSIS — L814 Other melanin hyperpigmentation: Secondary | ICD-10-CM | POA: Diagnosis not present

## 2021-07-10 DIAGNOSIS — L570 Actinic keratosis: Secondary | ICD-10-CM | POA: Diagnosis not present

## 2021-07-23 ENCOUNTER — Other Ambulatory Visit (HOSPITAL_BASED_OUTPATIENT_CLINIC_OR_DEPARTMENT_OTHER): Payer: Self-pay

## 2021-07-23 ENCOUNTER — Other Ambulatory Visit: Payer: Self-pay | Admitting: Family Medicine

## 2021-07-23 DIAGNOSIS — E785 Hyperlipidemia, unspecified: Secondary | ICD-10-CM

## 2021-07-23 MED ORDER — ROSUVASTATIN CALCIUM 20 MG PO TABS
ORAL_TABLET | Freq: Every day | ORAL | 0 refills | Status: DC
  Filled 2021-07-23: qty 90, 90d supply, fill #0

## 2021-07-23 MED FILL — Propranolol HCl Tab 20 MG: ORAL | 90 days supply | Qty: 180 | Fill #1 | Status: AC

## 2021-07-24 ENCOUNTER — Encounter: Payer: Self-pay | Admitting: Family Medicine

## 2021-07-25 ENCOUNTER — Other Ambulatory Visit (HOSPITAL_BASED_OUTPATIENT_CLINIC_OR_DEPARTMENT_OTHER): Payer: Self-pay

## 2021-07-25 MED ORDER — IMIQUIMOD 5 % EX CREA
TOPICAL_CREAM | CUTANEOUS | 0 refills | Status: DC
  Filled 2021-07-25: qty 12, 28d supply, fill #0

## 2021-07-26 DIAGNOSIS — M79671 Pain in right foot: Secondary | ICD-10-CM | POA: Diagnosis not present

## 2021-07-28 ENCOUNTER — Other Ambulatory Visit (HOSPITAL_BASED_OUTPATIENT_CLINIC_OR_DEPARTMENT_OTHER): Payer: Self-pay

## 2021-07-29 DIAGNOSIS — M7741 Metatarsalgia, right foot: Secondary | ICD-10-CM | POA: Diagnosis not present

## 2021-08-23 ENCOUNTER — Other Ambulatory Visit (HOSPITAL_BASED_OUTPATIENT_CLINIC_OR_DEPARTMENT_OTHER): Payer: Self-pay

## 2021-09-10 ENCOUNTER — Other Ambulatory Visit (HOSPITAL_BASED_OUTPATIENT_CLINIC_OR_DEPARTMENT_OTHER): Payer: Self-pay

## 2021-09-10 MED ORDER — INFLUENZA VAC A&B SA ADJ QUAD 0.5 ML IM PRSY
PREFILLED_SYRINGE | INTRAMUSCULAR | 0 refills | Status: DC
Start: 2021-09-10 — End: 2021-12-23
  Filled 2021-09-10: qty 0.5, 1d supply, fill #0

## 2021-09-17 DIAGNOSIS — D034 Melanoma in situ of scalp and neck: Secondary | ICD-10-CM | POA: Diagnosis not present

## 2021-09-18 ENCOUNTER — Other Ambulatory Visit (HOSPITAL_BASED_OUTPATIENT_CLINIC_OR_DEPARTMENT_OTHER): Payer: Self-pay

## 2021-09-18 MED ORDER — IMIQUIMOD 5 % EX CREA
TOPICAL_CREAM | CUTANEOUS | 0 refills | Status: DC
  Filled 2021-09-18: qty 13, 26d supply, fill #0

## 2021-09-30 ENCOUNTER — Other Ambulatory Visit: Payer: Self-pay | Admitting: Family Medicine

## 2021-09-30 ENCOUNTER — Other Ambulatory Visit (HOSPITAL_BASED_OUTPATIENT_CLINIC_OR_DEPARTMENT_OTHER): Payer: Self-pay

## 2021-09-30 MED ORDER — RIVAROXABAN 20 MG PO TABS
ORAL_TABLET | Freq: Every day | ORAL | 2 refills | Status: DC
  Filled 2021-09-30: qty 30, 30d supply, fill #0
  Filled 2021-11-22: qty 30, 30d supply, fill #1
  Filled 2021-12-31: qty 30, 30d supply, fill #2

## 2021-10-02 ENCOUNTER — Other Ambulatory Visit (HOSPITAL_BASED_OUTPATIENT_CLINIC_OR_DEPARTMENT_OTHER): Payer: Self-pay

## 2021-10-02 MED ORDER — MESALAMINE 1.2 G PO TBEC
DELAYED_RELEASE_TABLET | ORAL | 6 refills | Status: DC
  Filled 2021-10-02: qty 120, 60d supply, fill #0

## 2021-10-02 MED ORDER — MESALAMINE 1.2 G PO TBEC
DELAYED_RELEASE_TABLET | ORAL | 2 refills | Status: DC
  Filled 2021-10-02 – 2021-12-11 (×2): qty 120, 60d supply, fill #0
  Filled 2022-02-10: qty 120, 60d supply, fill #1
  Filled 2022-04-03: qty 120, 60d supply, fill #2

## 2021-10-21 ENCOUNTER — Other Ambulatory Visit: Payer: Self-pay | Admitting: Family Medicine

## 2021-10-21 ENCOUNTER — Other Ambulatory Visit (HOSPITAL_BASED_OUTPATIENT_CLINIC_OR_DEPARTMENT_OTHER): Payer: Self-pay

## 2021-10-21 DIAGNOSIS — E785 Hyperlipidemia, unspecified: Secondary | ICD-10-CM

## 2021-10-21 MED ORDER — ROSUVASTATIN CALCIUM 20 MG PO TABS
ORAL_TABLET | Freq: Every day | ORAL | 0 refills | Status: DC
  Filled 2021-10-21: qty 90, 90d supply, fill #0

## 2021-10-21 MED FILL — Propranolol HCl Tab 20 MG: ORAL | 90 days supply | Qty: 180 | Fill #2 | Status: AC

## 2021-11-04 ENCOUNTER — Other Ambulatory Visit (HOSPITAL_BASED_OUTPATIENT_CLINIC_OR_DEPARTMENT_OTHER): Payer: Self-pay

## 2021-11-04 DIAGNOSIS — D034 Melanoma in situ of scalp and neck: Secondary | ICD-10-CM | POA: Diagnosis not present

## 2021-11-04 DIAGNOSIS — L988 Other specified disorders of the skin and subcutaneous tissue: Secondary | ICD-10-CM | POA: Diagnosis not present

## 2021-11-04 MED ORDER — DOXYCYCLINE HYCLATE 100 MG PO CAPS
ORAL_CAPSULE | ORAL | 0 refills | Status: DC
  Filled 2021-11-04: qty 10, 5d supply, fill #0

## 2021-11-05 DIAGNOSIS — D034 Melanoma in situ of scalp and neck: Secondary | ICD-10-CM | POA: Diagnosis not present

## 2021-11-05 DIAGNOSIS — L988 Other specified disorders of the skin and subcutaneous tissue: Secondary | ICD-10-CM | POA: Diagnosis not present

## 2021-11-05 HISTORY — PX: MOHS SURGERY: SHX181

## 2021-11-19 DIAGNOSIS — Z4802 Encounter for removal of sutures: Secondary | ICD-10-CM | POA: Diagnosis not present

## 2021-11-19 DIAGNOSIS — L570 Actinic keratosis: Secondary | ICD-10-CM | POA: Diagnosis not present

## 2021-11-21 DIAGNOSIS — G518 Other disorders of facial nerve: Secondary | ICD-10-CM | POA: Diagnosis not present

## 2021-11-21 DIAGNOSIS — Z86711 Personal history of pulmonary embolism: Secondary | ICD-10-CM | POA: Diagnosis not present

## 2021-11-21 DIAGNOSIS — Z9889 Other specified postprocedural states: Secondary | ICD-10-CM | POA: Diagnosis not present

## 2021-11-21 DIAGNOSIS — R2681 Unsteadiness on feet: Secondary | ICD-10-CM | POA: Diagnosis not present

## 2021-11-21 DIAGNOSIS — Z86718 Personal history of other venous thrombosis and embolism: Secondary | ICD-10-CM | POA: Diagnosis not present

## 2021-11-21 DIAGNOSIS — Z8669 Personal history of other diseases of the nervous system and sense organs: Secondary | ICD-10-CM | POA: Diagnosis not present

## 2021-11-21 DIAGNOSIS — H903 Sensorineural hearing loss, bilateral: Secondary | ICD-10-CM | POA: Diagnosis not present

## 2021-11-21 DIAGNOSIS — H7201 Central perforation of tympanic membrane, right ear: Secondary | ICD-10-CM | POA: Diagnosis not present

## 2021-11-21 DIAGNOSIS — C434 Malignant melanoma of scalp and neck: Secondary | ICD-10-CM | POA: Diagnosis not present

## 2021-11-21 DIAGNOSIS — R2689 Other abnormalities of gait and mobility: Secondary | ICD-10-CM | POA: Diagnosis not present

## 2021-11-21 DIAGNOSIS — H90A22 Sensorineural hearing loss, unilateral, left ear, with restricted hearing on the contralateral side: Secondary | ICD-10-CM | POA: Diagnosis not present

## 2021-11-21 DIAGNOSIS — Z974 Presence of external hearing-aid: Secondary | ICD-10-CM | POA: Diagnosis not present

## 2021-11-21 DIAGNOSIS — D333 Benign neoplasm of cranial nerves: Secondary | ICD-10-CM | POA: Diagnosis not present

## 2021-11-21 DIAGNOSIS — Z7901 Long term (current) use of anticoagulants: Secondary | ICD-10-CM | POA: Diagnosis not present

## 2021-11-21 DIAGNOSIS — H6123 Impacted cerumen, bilateral: Secondary | ICD-10-CM | POA: Diagnosis not present

## 2021-11-22 ENCOUNTER — Other Ambulatory Visit (HOSPITAL_BASED_OUTPATIENT_CLINIC_OR_DEPARTMENT_OTHER): Payer: Self-pay

## 2021-12-09 ENCOUNTER — Encounter: Payer: Self-pay | Admitting: Family Medicine

## 2021-12-10 NOTE — Telephone Encounter (Signed)
LVM for patient to call back and schedule appointment.

## 2021-12-11 ENCOUNTER — Other Ambulatory Visit (HOSPITAL_BASED_OUTPATIENT_CLINIC_OR_DEPARTMENT_OTHER): Payer: Self-pay

## 2021-12-11 ENCOUNTER — Ambulatory Visit: Payer: PPO | Admitting: Family Medicine

## 2021-12-11 ENCOUNTER — Telehealth (INDEPENDENT_AMBULATORY_CARE_PROVIDER_SITE_OTHER): Payer: PPO | Admitting: Family Medicine

## 2021-12-11 ENCOUNTER — Encounter: Payer: Self-pay | Admitting: Family Medicine

## 2021-12-11 VITALS — Temp 97.0°F

## 2021-12-11 DIAGNOSIS — S20364A Insect bite (nonvenomous) of middle front wall of thorax, initial encounter: Secondary | ICD-10-CM

## 2021-12-11 DIAGNOSIS — W57XXXA Bitten or stung by nonvenomous insect and other nonvenomous arthropods, initial encounter: Secondary | ICD-10-CM

## 2021-12-11 DIAGNOSIS — U071 COVID-19: Secondary | ICD-10-CM

## 2021-12-11 MED ORDER — DOXYCYCLINE HYCLATE 100 MG PO CAPS
ORAL_CAPSULE | ORAL | 0 refills | Status: DC
  Filled 2021-12-11: qty 10, 5d supply, fill #0

## 2021-12-11 NOTE — Progress Notes (Signed)
MyChart Video Visit    Virtual Visit via Video Note   This visit type was conducted due to national recommendations for restrictions regarding the COVID-19 Pandemic (e.g. social distancing) in an effort to limit this patient's exposure and mitigate transmission in our community. This patient is at least at moderate risk for complications without adequate follow up. This format is felt to be most appropriate for this patient at this time. Physical exam was limited by quality of the video and audio technology used for the visit. CMA was able to get the patient set up on a video visit.  Patient location: Home. Patient and provider in visit Provider location: Office  I discussed the limitations of evaluation and management by telemedicine and the availability of in person appointments. The patient expressed understanding and agreed to proceed.  Visit Date: 12/11/2021  Today's healthcare provider: Wellington Hampshire, MD     Subjective:    Patient ID: Shaun Armstrong, male    DOB: 06-26-1951, 71 y.o.   MRN: [id number]  Chief Complaint  Patient presents with   Insect Bite    Tick bite 2 days ago on upper left chest Redness, but going down, has been using neosporin    Covid Positive    Tested positive for Covid on yesterday Stated he does not have any symptoms, wife tested positive before he did     HPI Tick on upper chest-removed 1/1.  Not sure how long there.  Prob <24 hrs.  Itching.  Had been hiking earlier in day.  Redness improving w/neosporin Has had tick bites in past but this tick is different.  Has a yellow/white center.  Tested yesterday for Covid-wife dx 1.5 wks ago.  Did quarantine. Tested when wife did but again yesterday just to see.  No f/c. No symptoms x tired for the past 1.5 wks and sleeping 12 hrs. .  Feeling much better. Did have a slight runny nose for 1 wk.  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   Crohn's disease ([place])    Deafness in  right ear, wears a microphone    Decreased peripheral vision 11/04/2011   Essential tremor    HLD (hyperlipidemia)    Inguinal hernia 07/2016   Inguinal hernia    Retinal detachment    Wears hearing aid, left ear     Past Surgical History:  Procedure Laterality Date   ACOUSTIC NEUROMA RESECTION Right    BROW LIFT AND BLEPHAROPLASTY Bilateral 10/11/2012   CATARACT EXTRACTION W/ INTRAOCULAR LENS  IMPLANT, BILATERAL Bilateral    HERNIA REPAIR     INGUINAL HERNIA REPAIR Right 07/16/2016   Procedure: LAPAROSCOPIC RIGHT INGUINAL HERNIA;  Surgeon: Coralie Keens, MD;  Location: [facility];  Service: General;  Laterality: Right;   INSERTION OF MESH Right 07/16/2016   Procedure: INSERTION OF MESH;  Surgeon: Coralie Keens, MD;  Location: [facility];  Service: General;  Laterality: Right;   MOHS SURGERY N/A 11/05/2021   scalp   RETINAL DETACHMENT SURGERY Left    TONSILLECTOMY      Outpatient Medications Prior to Visit  Medication Sig Dispense Refill   Cholecalciferol (VITAMIN D) 125 MCG (5000 UT) CAPS Take 1 capsule by mouth every 3 (three) days.     COVID-19 mRNA Vac-TriS, Pfizer, SUSP injection Inject into the muscle. 0.3 mL 0   glucosamine-chondroitin 500-400 MG tablet Take 1 tablet by mouth daily.      imiquimod (ALDARA)  5 % cream Apply 1/2 packet to affected area every night For use on top of the scalp 13 each 0   influenza vaccine adjuvanted (FLUAD) 0.5 ML injection Inject into the muscle. 0.5 mL 0   mesalamine (LIALDA) 1.2 g EC tablet Take 2 tablets by mouth once a day 120 tablet 2   Multiple Vitamin (MULTIVITAMIN) tablet Take 1 tablet by mouth daily.     Omega-3 Fatty Acids (OMEGA 3 PO) Take 2 capsules by mouth daily.     Polyethyl Glycol-Propyl Glycol (SYSTANE FREE OP) Place 2 drops into both eyes 5 (five) times daily as needed (dry eyes).     Probiotic Product (ALIGN) 4 MG CAPS 1 tablet     propranolol (INDERAL) 20 MG tablet TAKE TWO TABLETS BY  MOUTH DAILY 180 tablet 3   rivaroxaban (XARELTO) 20 MG TABS tablet TAKE 1 TABLET BY MOUTH DAILY WITH SUPPER 30 tablet 2   rosuvastatin (CRESTOR) 20 MG tablet TAKE 1 TABLET BY MOUTH ONCE DAILY 90 tablet 0   doxycycline (VIBRAMYCIN) 100 MG capsule Take 1 capsule by mouth twice a day with meals 10 capsule 0   mesalamine (LIALDA) 1.2 g EC tablet Take 2.4 g by mouth daily with breakfast.     mesalamine (LIALDA) 1.2 g EC tablet Take 2 tablets by mouth once daily 120 tablet 11   mesalamine (LIALDA) 1.2 g EC tablet Take 2 tablets by mouth once a day 120 tablet 6   polyethylene glycol-electrolytes (TRILYTE) 420 g solution Take as directed by mouth. See prep sheet for instructions. 4000 mL 0   No facility-administered medications prior to visit.    Allergies  Allergen Reactions   Cefuroxime Other (See Comments)    Severe dehydration Other reaction(s): unsure   Cefuroxime Axetil     Other reaction(s): Other (See Comments) dehydration   Poison Ivy Extract Rash        Objective:     Physical Exam Vitals and nursing note reviewed.  Constitutional:      General:  is not in acute distress. Assymetrical face.    Appearance: Normal appearance.  HENT:     Head: Normocephalic.  Pulmonary:     Effort: No respiratory distress.  Skin:    General: Skin is dry.  Cannot see tick bite on video    Coloration: Skin is not pale.  Neurological:     Mental Status: Pt is alert and oriented to person, place, and time.  Psychiatric:        Mood and Affect: Mood normal.   Temp (!) 97 F (36.1 C) (Oral)   Wt Readings from Last 3 Encounters:  06/13/21 199 lb (90.3 kg)  04/01/21 201 lb 9.6 oz (91.4 kg)  03/08/21 200 lb 12.8 oz (91.1 kg)       Assessment & Plan:   Problem List Items Addressed This Visit   None Visit Diagnoses     Tick bite of middle front wall of thorax, initial encounter    -  Primary   COVID-19          Tick bite-this is different for him-possibly some pus but putting  neosporin on it.  Will do doxy - but pt wants to wait and monitor for now. Covid 19-possibly asymptomatic.  May have started over 1 wk ago but didn't test till now(and only tested "for the heck of it).  Declines meds as much better re fatigue-agree.  Monitor for now  Meds ordered this encounter  Medications  doxycycline (VIBRAMYCIN) 100 MG capsule    Sig: Take 1 capsule by mouth twice a day with meals    Dispense:  10 capsule    Refill:  0    I discussed the assessment and treatment plan with the patient. The patient was provided an opportunity to ask questions and all were answered. The patient agreed with the plan and demonstrated an understanding of the instructions.   The patient was advised to call back or seek an in-person evaluation if the symptoms worsen or if the condition fails to improve as anticipated.  I provided 20 minutes of face-to-face time during this encounter.   Wellington Hampshire, MD [place] [phone] (phone) [phone] (fax)  [place]

## 2021-12-20 ENCOUNTER — Encounter: Payer: Self-pay | Admitting: Family Medicine

## 2021-12-23 ENCOUNTER — Encounter: Payer: Self-pay | Admitting: Family Medicine

## 2021-12-23 ENCOUNTER — Other Ambulatory Visit: Payer: Self-pay

## 2021-12-23 ENCOUNTER — Ambulatory Visit (INDEPENDENT_AMBULATORY_CARE_PROVIDER_SITE_OTHER): Payer: PPO | Admitting: Family Medicine

## 2021-12-23 VITALS — BP 123/76 | HR 50 | Temp 97.5°F | Ht 69.0 in | Wt 203.0 lb

## 2021-12-23 DIAGNOSIS — R351 Nocturia: Secondary | ICD-10-CM | POA: Diagnosis not present

## 2021-12-23 DIAGNOSIS — E210 Primary hyperparathyroidism: Secondary | ICD-10-CM | POA: Diagnosis not present

## 2021-12-23 DIAGNOSIS — L578 Other skin changes due to chronic exposure to nonionizing radiation: Secondary | ICD-10-CM | POA: Diagnosis not present

## 2021-12-23 LAB — POCT URINALYSIS DIPSTICK
Bilirubin, UA: NEGATIVE
Blood, UA: NEGATIVE
Glucose, UA: NEGATIVE
Ketones, UA: NEGATIVE
Leukocytes, UA: NEGATIVE
Nitrite, UA: NEGATIVE
Protein, UA: NEGATIVE
Spec Grav, UA: 1.02 (ref 1.010–1.025)
Urobilinogen, UA: 0.2 E.U./dL
pH, UA: 6 (ref 5.0–8.0)

## 2021-12-23 NOTE — Patient Instructions (Signed)
It was very nice to see you today!  We will check blood work to look for any other possible causes of your urinary issues.  This is probably due to your prostate.  Please let us know if you have any other new symptoms  Take care, Dr [name]  PLEASE NOTE:  If you had any lab tests please let us know if you have not heard back within a few days. You may see your results on mychart before we have a chance to review them but we will give you a call once they are reviewed by [place]. If we ordered any referrals today, please let us know if you have not heard from their office within the next week.   Please try these tips to maintain a healthy lifestyle:  Eat at least 3 REAL meals and 1-2 snacks per day.  Aim for no more than 5 hours between eating.  If you eat breakfast, please do so within one hour of getting up.   Each meal should contain half fruits/vegetables, one quarter protein, and one quarter carbs (no bigger than a computer mouse)  Cut down on sweet beverages. This includes juice, soda, and sweet tea.   Drink at least 1 glass of water with each meal and aim for at least 8 glasses per day  Exercise at least 150 minutes every week.

## 2021-12-23 NOTE — Assessment & Plan Note (Signed)
Check PTH.

## 2021-12-23 NOTE — Assessment & Plan Note (Signed)
Check labs.  Could be contributing to urinary frequency.

## 2021-12-23 NOTE — Telephone Encounter (Signed)
Patient has appointment today with PCP

## 2021-12-23 NOTE — Progress Notes (Signed)
° °  Shaun Armstrong is a 70 y.o. male who presents today for an office visit.  Assessment/Plan:  New/Acute Problems: Nocturia Likely BPH. POC UA is normal. Will check labs to rule out other causes.  If work-up is negative.  Patient does not wish to start medications for BPH at this point.  Discussed reasons to return to care  Vertigo Given his very brief episode a couple of weeks ago without any lingering symptoms or new symptoms we will not pursue further work-up at this point.  We will continue with watchful waiting.  History consistent with BPPV.  Reassuring neuro exam today.  Discussed reasons to return to care or seek emergent care.  Chronic Problems Addressed Today: Sun-damaged skin Follows with dermatology. Recently had melanoma in scalp that was removed.   Hypercalcemia Check labs.  Could be contributing to urinary frequency.  Hyperparathyroidism, primary ([place]) Check PTH.     Subjective:  HPI:  Patient here with urinary frequency. Started several weeks ago.  Wakes up 3-4 times at night to urinate. No dysuria. No fevers or chills. He has been trying to make an effort to drink more but this seems excessive to him. Symptoms have been stable.  He recently had a melanoma removed from his scalp and had a tick bite.  No other recent illnesses.  He had 1 episode a couple weeks ago when he was doing work outside.  He stood up and felt a room spinning sensation for 10 to 15 seconds.  Has not had a recurrence since then.  No reported weakness or numbness.       Objective:  Physical Exam: BP 123/76    Pulse (!) 50    Temp (!) 97.5 F (36.4 C) (Temporal)    Ht 5' 9"  (1.753 m)    Wt 203 lb (92.1 kg)    SpO2 100%    BMI 29.98 kg/m   Gen: No acute distress, resting comfortably CV: Regular rate and rhythm with no murmurs appreciated Pulm: Normal work of breathing, clear to auscultation bilaterally with no crackles, wheezes, or rhonchi Neuro: CN 2-12 intact.  No gross deficits.  Strength  normal throughout. Psych: Normal affect and thought content      Maurisa Tesmer M. [name], MD 12/23/2021 3:40 PM

## 2021-12-23 NOTE — Assessment & Plan Note (Signed)
Follows with dermatology. Recently had melanoma in scalp that was removed.

## 2021-12-24 LAB — COMPREHENSIVE METABOLIC PANEL
ALT: 29 U/L (ref 0–53)
AST: 32 U/L (ref 0–37)
Albumin: 4.1 g/dL (ref 3.5–5.2)
Alkaline Phosphatase: 69 U/L (ref 39–117)
BUN: 20 mg/dL (ref 6–23)
CO2: 26 mEq/L (ref 19–32)
Calcium: 11.7 mg/dL — ABNORMAL HIGH (ref 8.4–10.5)
Chloride: 109 mEq/L (ref 96–112)
Creatinine, Ser: 1.03 mg/dL (ref 0.40–1.50)
GFR: 73.12 mL/min (ref >60.00)
Glucose, Bld: 117 mg/dL — ABNORMAL HIGH (ref 70–99)
Potassium: 5.2 mEq/L — ABNORMAL HIGH (ref 3.5–5.1)
Sodium: 147 mEq/L — ABNORMAL HIGH (ref 135–145)
Total Bilirubin: 0.5 mg/dL (ref 0.2–1.2)
Total Protein: 6.3 g/dL (ref 6.0–8.3)

## 2021-12-24 LAB — CBC
HCT: 44.2 % (ref 39.0–52.0)
Hemoglobin: 14.3 g/dL (ref 13.0–17.0)
MCHC: 32.3 g/dL (ref 30.0–36.0)
MCV: 91.9 fl (ref 78.0–100.0)
Platelets: 179 10*3/uL (ref 150.0–400.0)
RBC: 4.82 Mil/uL (ref 4.22–5.81)
RDW: 13.7 % (ref 11.5–15.5)
WBC: 6 10*3/uL (ref 4.0–10.5)

## 2021-12-24 LAB — URINE CULTURE
MICRO NUMBER:: 12875455
Result:: NO GROWTH
SPECIMEN QUALITY:: ADEQUATE

## 2021-12-24 LAB — PTH, INTACT AND CALCIUM
Calcium: 11.9 mg/dL — ABNORMAL HIGH (ref 8.6–10.3)
PTH: 58 pg/mL (ref 16–77)

## 2021-12-24 LAB — HEMOGLOBIN A1C: Hgb A1c MFr Bld: 5.8 % (ref 4.6–6.5)

## 2021-12-24 LAB — PSA: PSA: 3.05 ng/mL (ref 0.10–4.00)

## 2021-12-24 LAB — TSH: TSH: 2.91 u[IU]/mL (ref 0.35–5.50)

## 2021-12-25 ENCOUNTER — Other Ambulatory Visit: Payer: Self-pay | Admitting: *Deleted

## 2021-12-25 DIAGNOSIS — E875 Hyperkalemia: Secondary | ICD-10-CM

## 2021-12-25 NOTE — Progress Notes (Signed)
Please inform patient of the following:  His calcium is trending up and his sodium and potassium are a bit elevated. His A1c is stable and his PSA is normal. No signs of prostate cancer.   I would like for him to come back in 1-2 weeks to recheck CMET to make sure his sodium and potassium levels are stable.  His increasing calcium could explain some of his frequent urination but I think he probably has BPH also. He should schedule an appointment with his endocrinologist soon to discuss his calcium levels.  We can send in a medication for his prostate to see if it helps if he wishes or we can wait and see how he does after we get his calcium a bit lower.  Shaun Armstrong. [name], MD 12/25/2021 8:03 AM

## 2021-12-31 ENCOUNTER — Other Ambulatory Visit (HOSPITAL_BASED_OUTPATIENT_CLINIC_OR_DEPARTMENT_OTHER): Payer: Self-pay

## 2022-01-08 ENCOUNTER — Other Ambulatory Visit: Payer: PPO

## 2022-01-08 HISTORY — PX: MELANOMA EXCISION: SHX5266

## 2022-01-14 ENCOUNTER — Ambulatory Visit: Payer: PPO | Admitting: Endocrinology

## 2022-01-14 ENCOUNTER — Other Ambulatory Visit: Payer: Self-pay

## 2022-01-14 VITALS — BP 130/62 | HR 60 | Ht 69.0 in | Wt 203.0 lb

## 2022-01-14 DIAGNOSIS — E210 Primary hyperparathyroidism: Secondary | ICD-10-CM | POA: Diagnosis not present

## 2022-01-14 NOTE — Progress Notes (Signed)
Subjective:    Patient ID: Shaun Armstrong, male    DOB: 11-Aug-1951, 71 y.o.   MRN: [id number]  HPI Pt returns for f/u of primary hyperparathyroidism (dx'ed 2006; other w/u of hypercalcemia was neg; no h/o osteoporosis or urolithiasis; only bony fracture was right little finger (1970's); DEXA (2021) showed worst T-score was-2.1 (LFN) 24HR urine Ca++ was 266 mg).  He takes Vit-D, 2000 units TIW.  pt states he feels well in general.   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   Crohn's disease ([place])    Deafness in right ear, wears a microphone    Decreased peripheral vision 11/04/2011   Essential tremor    HLD (hyperlipidemia)    Inguinal hernia 07/2016   Inguinal hernia    Retinal detachment    Wears hearing aid, left ear     Past Surgical History:  Procedure Laterality Date   ACOUSTIC NEUROMA RESECTION Right    BROW LIFT AND BLEPHAROPLASTY Bilateral 10/11/2012   CATARACT EXTRACTION W/ INTRAOCULAR LENS  IMPLANT, BILATERAL Bilateral    HERNIA REPAIR     INGUINAL HERNIA REPAIR Right 07/16/2016   Procedure: LAPAROSCOPIC RIGHT INGUINAL HERNIA;  Surgeon: Coralie Keens, MD;  Location: [facility];  Service: General;  Laterality: Right;   INSERTION OF MESH Right 07/16/2016   Procedure: INSERTION OF MESH;  Surgeon: Coralie Keens, MD;  Location: [facility];  Service: General;  Laterality: Right;   MOHS SURGERY N/A 11/05/2021   scalp   RETINAL DETACHMENT SURGERY Left    TONSILLECTOMY      Social History   Socioeconomic History   Marital status: Married    Spouse name: Not on file   Number of children: Not on file   Years of education: Not on file   Highest education level: Not on file  Occupational History   Occupation: retired  Tobacco Use   Smoking status: Former    Packs/day: 1.00    Years: 4.00    Pack years: 4.00    Types: Cigarettes    Quit date: 12/09/1979    Years since quitting: 42.1   Smokeless tobacco:  Never   Tobacco comments:    Quit 38 years ago  Vaping Use   Vaping Use: Never used  Substance and Sexual Activity   Alcohol use: Yes    Comment: recent ER visit fall after having a few beers   Drug use: No   Sexual activity: Not on file  Other Topics Concern   Not on file  Social History Narrative   Not on file   Social Determinants of Health   Financial Resource Strain: Not on file  Food Insecurity: Not on file  Transportation Needs: Not on file  Physical Activity: Not on file  Stress: Not on file  Social Connections: Not on file  Intimate Partner Violence: Not on file    Current Outpatient Medications on File Prior to Visit  Medication Sig Dispense Refill   Cholecalciferol (VITAMIN D) 125 MCG (5000 UT) CAPS Take 1 capsule by mouth every 3 (three) days.     glucosamine-chondroitin 500-400 MG tablet Take 1 tablet by mouth daily.      mesalamine (LIALDA) 1.2 g EC tablet Take 2 tablets by mouth once a day 120 tablet 2   Multiple Vitamin (MULTIVITAMIN) tablet Take 1 tablet by mouth daily.     Omega-3 Fatty Acids (OMEGA 3 PO) Take 2 capsules by mouth daily.  Polyethyl Glycol-Propyl Glycol (SYSTANE FREE OP) Place 2 drops into both eyes 5 (five) times daily as needed (dry eyes).     propranolol (INDERAL) 20 MG tablet TAKE TWO TABLETS BY MOUTH DAILY 180 tablet 3   rivaroxaban (XARELTO) 20 MG TABS tablet TAKE 1 TABLET BY MOUTH DAILY WITH SUPPER 30 tablet 2   rosuvastatin (CRESTOR) 20 MG tablet TAKE 1 TABLET BY MOUTH ONCE DAILY 90 tablet 0   doxycycline (VIBRAMYCIN) 100 MG capsule Take 1 capsule by mouth twice a day with meals 10 capsule 0   imiquimod (ALDARA) 5 % cream Apply 1/2 packet to affected area every night For use on top of the scalp 13 each 0   Probiotic Product (ALIGN) 4 MG CAPS 1 tablet     No current facility-administered medications on file prior to visit.    Allergies  Allergen Reactions   Cefuroxime Other (See Comments)    Severe dehydration Other  reaction(s): unsure   Cefuroxime Axetil     Other reaction(s): Other (See Comments) dehydration   Poison Ivy Extract Rash    Family History  Problem Relation Age of Onset   Diabetes Mother    Dementia Mother    Diabetes Sister    Hyperparathyroidism Neg Hx     BP 130/62    Pulse 60    Ht 5' 9"  (1.753 m)    Wt 203 lb (92.1 kg)    SpO2 97%    BMI 29.98 kg/m    Review of Systems     Objective:   Physical Exam    Lab Results  Component Value Date   PTH 58 12/23/2021   CALCIUM 11.9 (H) 12/23/2021   CALCIUM 11.7 (H) 12/23/2021   CAION 5.90 (H) 04/20/2020       Assessment & Plan:  Hypercalcemia, persistent.    Primary hyperparathyroidism.  Although elev PTH is intermitt, Ca++ is high enough.   Patient Instructions  Please see a surgery specialist.  you will receive a phone call, about a day and time for an appointment.

## 2022-01-14 NOTE — Patient Instructions (Addendum)
Please see a surgery specialist.  you will receive a phone call, about a day and time for an appointment.

## 2022-01-15 DIAGNOSIS — H43813 Vitreous degeneration, bilateral: Secondary | ICD-10-CM | POA: Diagnosis not present

## 2022-01-15 DIAGNOSIS — H26492 Other secondary cataract, left eye: Secondary | ICD-10-CM | POA: Diagnosis not present

## 2022-01-15 DIAGNOSIS — H52203 Unspecified astigmatism, bilateral: Secondary | ICD-10-CM | POA: Diagnosis not present

## 2022-01-15 DIAGNOSIS — H04123 Dry eye syndrome of bilateral lacrimal glands: Secondary | ICD-10-CM | POA: Diagnosis not present

## 2022-01-20 ENCOUNTER — Ambulatory Visit (INDEPENDENT_AMBULATORY_CARE_PROVIDER_SITE_OTHER): Payer: PPO

## 2022-01-20 ENCOUNTER — Other Ambulatory Visit (INDEPENDENT_AMBULATORY_CARE_PROVIDER_SITE_OTHER): Payer: PPO

## 2022-01-20 ENCOUNTER — Encounter: Payer: Self-pay | Admitting: Endocrinology

## 2022-01-20 ENCOUNTER — Other Ambulatory Visit: Payer: Self-pay

## 2022-01-20 VITALS — BP 130/68 | HR 53 | Temp 97.3°F | Wt 201.0 lb

## 2022-01-20 DIAGNOSIS — Z0000 Encounter for general adult medical examination without abnormal findings: Secondary | ICD-10-CM

## 2022-01-20 DIAGNOSIS — E875 Hyperkalemia: Secondary | ICD-10-CM

## 2022-01-20 NOTE — Progress Notes (Signed)
Subjective:   Shaun Armstrong is a 71 y.o. male who presents for Medicare Annual/Subsequent preventive examination.  Review of Systems     Cardiac Risk Factors include: advanced age (>51mn, >>70women);dyslipidemia;male gender     Objective:    Today's Vitals   01/20/22 0824  BP: 130/68  Pulse: (!) 53  Temp: (!) 97.3 F (36.3 C)  SpO2: 95%  Weight: 201 lb (91.2 kg)   Body mass index is 29.68 kg/m.  Advanced Directives 01/20/2022 01/14/2021 10/04/2020 04/29/2018 07/21/2017 07/16/2016 07/10/2016  Does Patient Have a Medical Advance Directive? Yes Yes Yes No Yes Yes Yes  Type of AParamedicof A[place]ollingsLiving will [place]Living will - HMount EphraimLiving will - Living will  Does patient want to make changes to medical advance directive? - - - - No - Patient declined - No - Patient declined  Copy of [place] Chart? No - copy requested No - copy requested - - No - copy requested - -  Would patient like information on creating a medical advance directive? - - - No - Patient declined - - -    Current Medications (verified) Outpatient Encounter Medications as of 01/20/2022  Medication Sig   Cholecalciferol (VITAMIN D) 125 MCG (5000 UT) CAPS Take 1 capsule by mouth every 3 (three) days.   glucosamine-chondroitin 500-400 MG tablet Take 1 tablet by mouth daily.    mesalamine (LIALDA) 1.2 g EC tablet Take 2 tablets by mouth once a day   Multiple Vitamin (MULTIVITAMIN) tablet Take 1 tablet by mouth daily.   Omega-3 Fatty Acids (OMEGA 3 PO) Take 2 capsules by mouth daily.   Polyethyl Glycol-Propyl Glycol (SYSTANE FREE OP) Place 2 drops into both eyes 5 (five) times daily as needed (dry eyes).   propranolol (INDERAL) 20 MG tablet TAKE TWO TABLETS BY MOUTH DAILY   rivaroxaban (XARELTO) 20 MG TABS tablet TAKE 1 TABLET BY MOUTH DAILY WITH SUPPER   rosuvastatin (CRESTOR) 20 MG tablet TAKE 1  TABLET BY MOUTH ONCE DAILY   [DISCONTINUED] doxycycline (VIBRAMYCIN) 100 MG capsule Take 1 capsule by mouth twice a day with meals   [DISCONTINUED] imiquimod (ALDARA) 5 % cream Apply 1/2 packet to affected area every night For use on top of the scalp   [DISCONTINUED] Probiotic Product (ALIGN) 4 MG CAPS 1 tablet   No facility-administered encounter medications on file as of 01/20/2022.    Allergies (verified) Cefuroxime, Cefuroxime axetil, and Poison ivy extract   History: 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   [place]'s disease [place]    Deafness in right ear, wears a microphone    Decreased peripheral vision 11/04/2011   Essential tremor    HLD (hyperlipidemia)    Inguinal hernia 07/2016   Inguinal hernia    Retinal detachment    Wears hearing aid, left ear    Past Surgical History:  Procedure Laterality Date   ACOUSTIC NEUROMA RESECTION Right    BROW LIFT AND BLEPHAROPLASTY Bilateral 10/11/2012   CATARACT EXTRACTION W/ INTRAOCULAR LENS  IMPLANT, BILATERAL Bilateral    HERNIA REPAIR     INGUINAL HERNIA REPAIR Right 07/16/2016   Procedure: LAPAROSCOPIC RIGHT INGUINAL HERNIA;  Surgeon: DCoralie Keens MD;  [place] [facility]  Service: General;  Laterality: Right;   INSERTION OF MESH Right 07/16/2016   Procedure: INSERTION OF MESH;  Surgeon: DCoralie Keens MD;  [facility];  Service: General;  Laterality: Right;   MOHS SURGERY N/A 11/05/2021   scalp   RETINAL DETACHMENT SURGERY Left    TONSILLECTOMY     Family History  Problem Relation Age of Onset   Diabetes Mother    Dementia Mother    Diabetes Sister    Hyperparathyroidism Neg Hx    Social History   Socioeconomic History   Marital status: Married    Spouse name: Not on file   Number of children: Not on file   Years of education: Not on file   Highest education level: Not on file  Occupational History   Occupation: retired  Tobacco Use    Smoking status: Former    Packs/day: 1.00    Years: 4.00    Pack years: 4.00    Types: Cigarettes    Quit date: 12/09/1979    Years since quitting: 42.1   Smokeless tobacco: Never   Tobacco comments:    Quit 38 years ago  Vaping Use   Vaping Use: Never used  Substance and Sexual Activity   Alcohol use: Yes    Comment: recent ER visit fall after having a few beers   Drug use: No   Sexual activity: Not on file  Other Topics Concern   Not on file  Social History Narrative   Not on file   Social Determinants of Health   Financial Resource Strain: Low Risk    Difficulty of Paying Living Expenses: Not hard at all  Food Insecurity: No Food Insecurity   Worried About Charity fundraiser in the Last Year: Never true   [place] in the Last Year: Never true  Transportation Needs: No Transportation Needs   Lack of Transportation (Medical): No   Lack of Transportation (Non-Medical): No  Physical Activity: Sufficiently Active   Days of Exercise per Week: 4 days   Minutes of Exercise per Session: 60 min  Stress: No Stress Concern Present   Feeling of Stress : Not at all  Social Connections: Socially Integrated   Frequency of Communication with Friends and Family: More than three times a week   Frequency of Social Gatherings with Friends and Family: More than three times a week   Attends Religious Services: 1 to 4 times per year   Active Member of [employer] or Organizations: Yes   Attends Archivist Meetings: 1 to 4 times per year   Marital Status: Married    Tobacco Counseling Counseling given: Not Answered Tobacco comments: Quit 38 years ago   Clinical Intake:  Pre-visit preparation completed: Yes  Pain : No/denies pain     BMI - recorded: 29.68 Nutritional Status: BMI 25 -29 Overweight Nutritional Risks: None Diabetes: No  How often do you need to have someone help you when you read instructions, pamphlets, or other written materials from your doctor  or pharmacy?: 1 - Never  Diabetic?no  Interpreter Needed?: No  Information entered by :: Charlott Rakes, LPN   Activities of Daily Living In your present state of health, do you have any difficulty performing the following activities: 01/20/2022  Hearing? Y  Vision? N  Difficulty concentrating or making decisions? N  Walking or climbing stairs? N  Dressing or bathing? N  Doing errands, shopping? N  Preparing Food and eating ? N  Using the Toilet? N  In the past six months, have you accidently leaked urine? Y  Comment at times  Do you have problems with loss of  bowel control? N  Managing your Medications? N  Managing your Finances? N  Housekeeping or managing your Housekeeping? N  Some recent data might be hidden    Patient Care Team: Vivi Barrack, MD as PCP - General (Family Medicine) Ronald Lobo, MD as Consulting Physician (Gastroenterology) Derrek Gu as Consulting Physician (Dentistry) Marygrace Drought, MD as Consulting Physician (Ophthalmology) Mylinda Latina as Consulting Physician (Ophthalmology) Johna Roles, MD as Consulting Physician  Indicate any recent Medical Services you may have received from other than [facility] providers in the past year (date may be approximate).     Assessment:   This is a routine wellness examination for Anthonyjames.  Hearing/Vision screen Hearing Screening - Comments:: Left ear hearing aids no hearing in right ear  Vision Screening - Comments:: Pt follows up with Dr Satira Sark for annual eye exams   Dietary issues and exercise activities discussed: Current Exercise Habits: Home exercise routine, Type of exercise: walking, Time (Minutes): 60, Frequency (Times/Week): 4, Weekly Exercise (Minutes/Week): 240   Goals Addressed             This Visit's Progress    Patient Stated       Lose weight        Depression Screen PHQ 2/9 Scores 01/20/2022 01/14/2021 11/23/2019 07/22/2018 07/21/2017 06/01/2017  PHQ - 2 Score 0 0 0 0 0 0    Fall  Risk Fall Risk  01/20/2022 01/14/2021 07/22/2018 07/21/2017 06/01/2017  Falls in the past year? 1 1 Yes No No  Number falls in past yr: 1 1 1  - -  Injury with Fall? 0 0 - - -  Risk for fall due to : Impaired vision Impaired vision;Impaired balance/gait Other (Comment) - -  Risk for fall due to: Comment - - drinking  - -  Follow up Falls prevention discussed Falls prevention discussed Education provided - -    FALL RISK PREVENTION PERTAINING TO THE HOME:  Any stairs in or around the home? Yes  If so, are there any without handrails? No  Home free of loose throw rugs in walkways, pet beds, electrical cords, etc? Yes  Adequate lighting in your home to reduce risk of falls? Yes   ASSISTIVE DEVICES UTILIZED TO PREVENT FALLS:  Life alert? No  Use of a cane, walker or w/c? No  Grab bars in the bathroom? No  Shower chair or bench in shower? Yes  Elevated toilet seat or a handicapped toilet? No   TIMED UP AND GO:  Was the test performed? Yes .  Length of time to ambulate 10 feet: 10 sec.   Gait steady and fast without use of assistive device  Cognitive Function: MMSE - Mini Mental State Exam 07/22/2018  Not completed: (No Data)     6CIT Screen 01/20/2022 01/14/2021  What Year? 0 points 0 points  What month? 0 points 0 points  What time? 0 points -  Count back from 20 0 points 0 points  Months in reverse 0 points 0 points  Repeat phrase 0 points 0 points  Total Score 0 -    Immunizations Immunization History  Administered Date(s) Administered   Fluad Quad(high Dose 65+) 08/02/2019, 09/10/2021   Hep A / Hep B 03/03/2013   Hepatitis A 02/25/2013, 03/03/2013, 03/10/2013, 03/09/2014   Hepatitis B 02/25/2013, 03/03/2013, 03/10/2013, 03/09/2014   Influenza Split 09/18/2009   Influenza, High Dose Seasonal PF 08/30/2018   Influenza-Unspecified 08/22/2010, 09/09/2011, 09/25/2012, 09/18/2014, 09/30/2017, 08/30/2018, 08/03/2019, 09/03/2020   Moderna SARS-COV2 Booster Vaccination 10/29/2020  PFIZER Comirnaty(Gray Top)Covid-19 Tri-Sucrose Vaccine 06/03/2021   PFIZER(Purple Top)SARS-COV-2 Vaccination 01/02/2020, 01/09/2020   Td 06/22/2002   Tdap 11/12/2009, 04/01/2021    TDAP status: Up to date   Flu Vaccine status: Up to date  Pneumococcal vaccine status: Due, Education has been provided regarding the importance of this vaccine. Advised may receive this vaccine at local pharmacy or Health Dept. Aware to provide a copy of the vaccination record if obtained from local pharmacy or Health Dept. Verbalized acceptance and understanding.  Covid-19 vaccine status: Completed vaccines  Qualifies for Shingles Vaccine? Yes   Zostavax completed No   Shingrix Completed?: No.    Education has been provided regarding the importance of this vaccine. Patient has been advised to call insurance company to determine out of pocket expense if they have not yet received this vaccine. Advised may also receive vaccine at local pharmacy or Health Dept. Verbalized acceptance and understanding.  Screening Tests Health Maintenance  Topic Date Due   Pneumonia Vaccine 36+ Years old (1 - PCV) Never done   COVID-19 Vaccine (4 - Booster) 07/29/2021   Zoster Vaccines- Shingrix (1 of 2) 04/19/2022 (Originally 07/14/1970)   COLONOSCOPY (Pts 45-60yr Insurance coverage will need to be confirmed)  06/18/2024   TETANUS/TDAP  04/02/2031   INFLUENZA VACCINE  Completed   Hepatitis C Screening  Completed   HPV VACCINES  Aged Out    Health Maintenance  Health Maintenance Due  Topic Date Due   Pneumonia Vaccine 72 Years old (1 - PCV) Never done   COVID-19 Vaccine (4 - Booster) 07/29/2021    Colorectal cancer screening: Type of screening: Colonoscopy. Completed 06/18/21. Repeat every 3 years   Additional Screening:  Hepatitis C Screening: Completed 06/01/17  Vision Screening: Recommended annual ophthalmology exams for early detection of glaucoma and other disorders of the eye. Is the patient up to date  with their annual eye exam?  Yes  Who is the provider or what is the name of the office in which the patient attends annual eye exams? Dr TSatira Sark If pt is not established with a provider, would they like to be referred to a provider to establish care? No .   Dental Screening: Recommended annual dental exams for proper oral hygiene  Community Resource Referral / Chronic Care Management: CRR required this visit?  No   CCM required this visit?  No      Plan:     I have personally reviewed and noted the following in the patients chart:   Medical and social history Use of alcohol, tobacco or illicit drugs  Current medications and supplements including opioid prescriptions. Patient is not currently taking opioid prescriptions. Functional ability and status Nutritional status Physical activity Advanced directives List of other physicians Hospitalizations, surgeries, and ER visits in previous 12 months Vitals Screenings to include cognitive, depression, and falls Referrals and appointments  In addition, I have reviewed and discussed with patient certain preventive protocols, quality metrics, and best practice recommendations. A written personalized care plan for preventive services as well as general preventive health recommendations were provided to patient.     TWillette Brace LPN   [id number]/81/[id number]  Nurse Notes: None

## 2022-01-20 NOTE — Patient Instructions (Signed)
Mr. Shaun Armstrong , Thank you for taking time to come for your Medicare Wellness Visit. I appreciate your ongoing commitment to your health goals. Please review the following plan we discussed and let me know if I can assist you in the future.   Screening recommendations/referrals: Colonoscopy: Done 06/18/21 repeat very 3 years  Recommended yearly ophthalmology/optometry visit for glaucoma screening and checkup Recommended yearly dental visit for hygiene and checkup  Vaccinations: Influenza vaccine: Done 09/10/21 repeat every year  Pneumococcal vaccine: Due and discussed  Tdap vaccine: Done 04/01/21 repeat every 10 years  Shingles vaccine: Shingrix discussed. Please contact your pharmacy for coverage information.    Covid-19: Completed 1/25, 2/1, 10/29/20 &06/03/21  Advanced directives: Please bring a copy of your health care power of attorney and living will to the office at your convenience.  Conditions/risks identified: Lose weight   Next appointment: Follow up in one year for your annual wellness visit.   Preventive Care 55 Years and Older, Male Preventive care refers to lifestyle choices and visits with your health care provider that can promote health and wellness. What does preventive care include? A yearly physical exam. This is also called an annual well check. Dental exams once or twice a year. Routine eye exams. Ask your health care provider how often you should have your eyes checked. Personal lifestyle choices, including: Daily care of your teeth and gums. Regular physical activity. Eating a healthy diet. Avoiding tobacco and drug use. Limiting alcohol use. Practicing safe sex. Taking low doses of aspirin every day. Taking vitamin and mineral supplements as recommended by your health care provider. What happens during an annual well check? The services and screenings done by your health care provider during your annual well check will depend on your age, overall health,  lifestyle risk factors, and family history of disease. Counseling  Your health care provider may ask you questions about your: Alcohol use. Tobacco use. Drug use. Emotional well-being. Home and relationship well-being. Sexual activity. Eating habits. History of falls. Memory and ability to understand (cognition). Work and work Statistician. Screening  You may have the following tests or measurements: Height, weight, and BMI. Blood pressure. Lipid and cholesterol levels. These may be checked every 5 years, or more frequently if you are over 86 years old. Skin check. Lung cancer screening. You may have this screening every year starting at age 40 if you have a 30-pack-year history of smoking and currently smoke or have quit within the past 15 years. Fecal occult blood test (FOBT) of the stool. You may have this test every year starting at age 63. Flexible sigmoidoscopy or colonoscopy. You may have a sigmoidoscopy every 5 years or a colonoscopy every 10 years starting at age 53. Prostate cancer screening. Recommendations will vary depending on your family history and other risks. Hepatitis C blood test. Hepatitis B blood test. Sexually transmitted disease (STD) testing. Diabetes screening. This is done by checking your blood sugar (glucose) after you have not eaten for a while (fasting). You may have this done every 1-3 years. Abdominal aortic aneurysm (AAA) screening. You may need this if you are a current or former smoker. Osteoporosis. You may be screened starting at age 44 if you are at high risk. Talk with your health care provider about your test results, treatment options, and if necessary, the need for more tests. Vaccines  Your health care provider may recommend certain vaccines, such as: Influenza vaccine. This is recommended every year. Tetanus, diphtheria, and acellular pertussis (Tdap, Td)  vaccine. You may need a Td booster every 10 years. Zoster vaccine. You may need this  after age 58. Pneumococcal 13-valent conjugate (PCV13) vaccine. One dose is recommended after age 20. Pneumococcal polysaccharide (PPSV23) vaccine. One dose is recommended after age 17. Talk to your health care provider about which screenings and vaccines you need and how often you need them. This information is not intended to replace advice given to you by your health care provider. Make sure you discuss any questions you have with your health care provider. Document Released: 12/21/2015 Document Revised: 08/13/2016 Document Reviewed: 09/25/2015 Elsevier Interactive Patient Education  2017 [place] Prevention in the Home Falls can cause injuries. They can happen to people of all ages. There are many things you can do to make your home safe and to help prevent falls. What can I do on the outside of my home? Regularly fix the edges of walkways and driveways and fix any cracks. Remove anything that might make you trip as you walk through a door, such as a raised step or threshold. Trim any bushes or trees on the path to your home. Use bright outdoor lighting. Clear any walking paths of anything that might make someone trip, such as rocks or tools. Regularly check to see if handrails are loose or broken. Make sure that both sides of any steps have handrails. Any raised decks and porches should have guardrails on the edges. Have any leaves, snow, or ice cleared regularly. Use sand or salt on walking paths during winter. Clean up any spills in your garage right away. This includes oil or grease spills. What can I do in the bathroom? Use night lights. Install grab bars by the toilet and in the tub and shower. Do not use towel bars as grab bars. Use non-skid mats or decals in the tub or shower. If you need to sit down in the shower, use a plastic, non-slip stool. Keep the floor dry. Clean up any water that spills on the floor as soon as it happens. Remove soap buildup in the tub or  shower regularly. Attach bath mats securely with double-sided non-slip rug tape. Do not have throw rugs and other things on the floor that can make you trip. What can I do in the bedroom? Use night lights. Make sure that you have a light by your bed that is easy to reach. Do not use any sheets or blankets that are too big for your bed. They should not hang down onto the floor. Have a firm chair that has side arms. You can use this for support while you get dressed. Do not have throw rugs and other things on the floor that can make you trip. What can I do in the kitchen? Clean up any spills right away. Avoid walking on wet floors. Keep items that you use a lot in easy-to-reach places. If you need to reach something above you, use a strong step stool that has a grab bar. Keep electrical cords out of the way. Do not use floor polish or wax that makes floors slippery. If you must use wax, use non-skid floor wax. Do not have throw rugs and other things on the floor that can make you trip. What can I do with my stairs? Do not leave any items on the stairs. Make sure that there are handrails on both sides of the stairs and use them. Fix handrails that are broken or loose. Make sure that handrails are as long  as the stairways. Check any carpeting to make sure that it is firmly attached to the stairs. Fix any carpet that is loose or worn. Avoid having throw rugs at the top or bottom of the stairs. If you do have throw rugs, attach them to the floor with carpet tape. Make sure that you have a light switch at the top of the stairs and the bottom of the stairs. If you do not have them, ask someone to add them for you. What else can I do to help prevent falls? Wear shoes that: Do not have high heels. Have rubber bottoms. Are comfortable and fit you well. Are closed at the toe. Do not wear sandals. If you use a stepladder: Make sure that it is fully opened. Do not climb a closed stepladder. Make  sure that both sides of the stepladder are locked into place. Ask someone to hold it for you, if possible. Clearly mark and make sure that you can see: Any grab bars or handrails. First and last steps. Where the edge of each step is. Use tools that help you move around (mobility aids) if they are needed. These include: Canes. Walkers. Scooters. Crutches. Turn on the lights when you go into a dark area. Replace any light bulbs as soon as they burn out. Set up your furniture so you have a clear path. Avoid moving your furniture around. If any of your floors are uneven, fix them. If there are any pets around you, be aware of where they are. Review your medicines with your doctor. Some medicines can make you feel dizzy. This can increase your chance of falling. Ask your doctor what other things that you can do to help prevent falls. This information is not intended to replace advice given to you by your health care provider. Make sure you discuss any questions you have with your health care provider. Document Released: 09/20/2009 Document Revised: 05/01/2016 Document Reviewed: 12/29/2014 Elsevier Interactive Patient Education  2017 [employer].

## 2022-01-21 ENCOUNTER — Telehealth: Payer: Self-pay

## 2022-01-21 LAB — COMPREHENSIVE METABOLIC PANEL
ALT: 30 U/L (ref 0–53)
AST: 28 U/L (ref 0–37)
Albumin: 4.2 g/dL (ref 3.5–5.2)
Alkaline Phosphatase: 72 U/L (ref 39–117)
BUN: 18 mg/dL (ref 6–23)
CO2: 35 mEq/L — ABNORMAL HIGH (ref 19–32)
Calcium: 11.4 mg/dL — ABNORMAL HIGH (ref 8.4–10.5)
Chloride: 111 mEq/L (ref 96–112)
Creatinine, Ser: 1.11 mg/dL (ref 0.40–1.50)
GFR: 66.8 mL/min (ref >60.00)
Glucose, Bld: 92 mg/dL (ref 70–99)
Potassium: 5.2 mEq/L — ABNORMAL HIGH (ref 3.5–5.1)
Sodium: 146 mEq/L — ABNORMAL HIGH (ref 135–145)
Total Bilirubin: 0.4 mg/dL (ref 0.2–1.2)
Total Protein: 6.3 g/dL (ref 6.0–8.3)

## 2022-01-21 NOTE — Telephone Encounter (Signed)
Regarding the meeting with the provider-provider to determine how to move forward with the patient's Parathyroid Surgeon would you like for the patient come in office or to contact the surgeon to set up a meeting? If so when would the best time be?  Please advise,  Thanks

## 2022-01-21 NOTE — Telephone Encounter (Signed)
Attempted to contact the patient to inform him that Dr. Loanne Drilling says that the patient needs to follow up with the Surgeon and that no meeting is needing with Dr. Loanne Drilling. LVM for a call back if the patient has any other questions or concerns.

## 2022-01-23 NOTE — Progress Notes (Signed)
Please inform patient of the following:  His sodium and potassium levels are stable. Do not need to do any further testing at this time. We can recheck next time he comes in for an office visit.  Shaun Armstrong. [name], MD 01/23/2022 8:08 AM

## 2022-01-27 ENCOUNTER — Other Ambulatory Visit: Payer: Self-pay | Admitting: Family Medicine

## 2022-01-27 ENCOUNTER — Other Ambulatory Visit (HOSPITAL_BASED_OUTPATIENT_CLINIC_OR_DEPARTMENT_OTHER): Payer: Self-pay

## 2022-01-27 DIAGNOSIS — E785 Hyperlipidemia, unspecified: Secondary | ICD-10-CM

## 2022-01-27 MED ORDER — ROSUVASTATIN CALCIUM 20 MG PO TABS
ORAL_TABLET | Freq: Every day | ORAL | 0 refills | Status: DC
  Filled 2022-01-27: qty 90, 90d supply, fill #0

## 2022-01-27 MED ORDER — RIVAROXABAN 20 MG PO TABS
ORAL_TABLET | Freq: Every day | ORAL | 2 refills | Status: DC
  Filled 2022-01-27: qty 30, 30d supply, fill #0
  Filled 2022-03-10: qty 30, 30d supply, fill #1
  Filled 2022-04-03: qty 30, 30d supply, fill #2

## 2022-01-27 MED ORDER — PROPRANOLOL HCL 20 MG PO TABS
ORAL_TABLET | Freq: Every day | ORAL | 3 refills | Status: DC
  Filled 2022-01-27: qty 180, 90d supply, fill #0
  Filled 2022-06-06: qty 180, 90d supply, fill #1
  Filled 2022-09-29: qty 180, 90d supply, fill #2
  Filled 2023-01-15: qty 180, 90d supply, fill #3

## 2022-01-27 NOTE — Telephone Encounter (Signed)
This message has already been addressed.

## 2022-01-28 DIAGNOSIS — H26492 Other secondary cataract, left eye: Secondary | ICD-10-CM | POA: Diagnosis not present

## 2022-02-10 ENCOUNTER — Other Ambulatory Visit (HOSPITAL_BASED_OUTPATIENT_CLINIC_OR_DEPARTMENT_OTHER): Payer: Self-pay

## 2022-02-11 DIAGNOSIS — H903 Sensorineural hearing loss, bilateral: Secondary | ICD-10-CM | POA: Diagnosis not present

## 2022-02-13 DIAGNOSIS — Z85820 Personal history of malignant melanoma of skin: Secondary | ICD-10-CM | POA: Diagnosis not present

## 2022-02-13 DIAGNOSIS — L820 Inflamed seborrheic keratosis: Secondary | ICD-10-CM | POA: Diagnosis not present

## 2022-02-13 DIAGNOSIS — L570 Actinic keratosis: Secondary | ICD-10-CM | POA: Diagnosis not present

## 2022-02-13 DIAGNOSIS — D225 Melanocytic nevi of trunk: Secondary | ICD-10-CM | POA: Diagnosis not present

## 2022-02-13 DIAGNOSIS — L821 Other seborrheic keratosis: Secondary | ICD-10-CM | POA: Diagnosis not present

## 2022-02-13 DIAGNOSIS — D485 Neoplasm of uncertain behavior of skin: Secondary | ICD-10-CM | POA: Diagnosis not present

## 2022-02-13 DIAGNOSIS — L853 Xerosis cutis: Secondary | ICD-10-CM | POA: Diagnosis not present

## 2022-02-13 DIAGNOSIS — D1801 Hemangioma of skin and subcutaneous tissue: Secondary | ICD-10-CM | POA: Diagnosis not present

## 2022-02-24 DIAGNOSIS — E210 Primary hyperparathyroidism: Secondary | ICD-10-CM | POA: Diagnosis not present

## 2022-02-27 DIAGNOSIS — D485 Neoplasm of uncertain behavior of skin: Secondary | ICD-10-CM | POA: Diagnosis not present

## 2022-02-27 DIAGNOSIS — L988 Other specified disorders of the skin and subcutaneous tissue: Secondary | ICD-10-CM | POA: Diagnosis not present

## 2022-02-28 ENCOUNTER — Other Ambulatory Visit (HOSPITAL_BASED_OUTPATIENT_CLINIC_OR_DEPARTMENT_OTHER): Payer: Self-pay

## 2022-02-28 ENCOUNTER — Ambulatory Visit: Payer: PPO | Attending: Internal Medicine

## 2022-02-28 DIAGNOSIS — Z23 Encounter for immunization: Secondary | ICD-10-CM

## 2022-03-03 ENCOUNTER — Other Ambulatory Visit (HOSPITAL_COMMUNITY): Payer: Self-pay | Admitting: General Surgery

## 2022-03-03 DIAGNOSIS — E210 Primary hyperparathyroidism: Secondary | ICD-10-CM

## 2022-03-05 NOTE — Progress Notes (Signed)
? ?  Covid[facility] ? ?Name:  GRAINGER MCCARLEY    ?MRN: [id number] ?DOB: 09-Jan-1951 ? ?03/05/2022 ? ?Mr. Rajan was observed post Covid-19 immunization for 15 minutes without incident. He was provided with Vaccine Information Sheet and instruction to access the V-Safe system.  ? ?Mr. Sudberry was instructed to call 911 with any severe reactions post vaccine: ?Difficulty breathing  ?Swelling of face and throat  ?A fast heartbeat  ?A bad rash all over body  ?Dizziness and weakness  ? ?Immunizations Administered   ? ? Name Date Dose VIS Date Route  ? Ambulance person Booster 02/28/2022  1:09 PM 0.3 mL 08/07/2021 Intramuscular  ? Manufacturer: [place]: [phone]  ? [place]: [phone]  ? ?  ? ? ?

## 2022-03-06 ENCOUNTER — Other Ambulatory Visit (HOSPITAL_BASED_OUTPATIENT_CLINIC_OR_DEPARTMENT_OTHER): Payer: Self-pay

## 2022-03-06 MED ORDER — PFIZER COVID-19 VAC BIVALENT 30 MCG/0.3ML IM SUSP
INTRAMUSCULAR | 0 refills | Status: DC
Start: 2022-02-28 — End: 2022-04-23
  Filled 2022-03-06: qty 0.3, 1d supply, fill #0

## 2022-03-10 ENCOUNTER — Other Ambulatory Visit (HOSPITAL_BASED_OUTPATIENT_CLINIC_OR_DEPARTMENT_OTHER): Payer: Self-pay

## 2022-03-10 ENCOUNTER — Encounter (HOSPITAL_COMMUNITY)
Admission: RE | Admit: 2022-03-10 | Discharge: 2022-03-10 | Disposition: A | Discharge status: Home / Self Care | Payer: PPO | Source: Ambulatory Visit | Attending: General Surgery | Admitting: General Surgery

## 2022-03-10 DIAGNOSIS — E213 Hyperparathyroidism, unspecified: Secondary | ICD-10-CM | POA: Diagnosis not present

## 2022-03-10 DIAGNOSIS — E210 Primary hyperparathyroidism: Secondary | ICD-10-CM | POA: Insufficient documentation

## 2022-03-10 MED ORDER — TECHNETIUM TC 99M SESTAMIBI - CARDIOLITE
25.6000 | Freq: Once | INTRAVENOUS | Status: AC | PRN
  Administered 2022-03-10: 25.6 via INTRAVENOUS

## 2022-03-13 ENCOUNTER — Ambulatory Visit: Payer: Self-pay | Admitting: General Surgery

## 2022-03-13 NOTE — H&P (Signed)
?Chief Complaint: Thyroid Problem ?  ?  ?History of Present Illness: ?Shaun Armstrong is a 71 y.o. male who is seen today as an office consultation for evaluation of Thyroid Problem ? .   ?  ?Patient is a 71 year old male who comes in secondary to hypercalcemia, elevated PTH level, primary hyperparathyroidism. ?  ?Patient's been followed by Dr. Loanne Drilling.  Patient's had multiple calcium checks which have shown elevated calcium level.  Patient's most recent calcium level was 11.4.  Prior to this was 11.2.  Patient's most recent PTH level was 58 and normal however previously was 91, 94, 103.  Patient also with normal vitamin D levels in the past. ?  ?Patient denies any signs of cramps, palpitations, renal stones, pathological fractures. ?  ?Patient's had a previous surgery for inguinal hernia, cataract surgery and acoustic neuroma. ?  ?  ?   ?Review of Systems: ?A complete review of systems was obtained from the patient.  I have reviewed this information and discussed as appropriate with the patient.  See HPI as well for other ROS. ?  ?Review of Systems  ?Constitutional: Negative for fever.  ?HENT: Negative for congestion.   ?Eyes: Negative for blurred vision.  ?Respiratory: Negative for cough, shortness of breath and wheezing.   ?Cardiovascular: Negative for chest pain and palpitations.  ?Gastrointestinal: Negative for heartburn.  ?Genitourinary: Negative for dysuria.  ?Musculoskeletal: Negative for myalgias.  ?Skin: Negative for rash.  ?Neurological: Negative for dizziness and headaches.  ?Psychiatric/Behavioral: Negative for depression and suicidal ideas.  ?All other systems reviewed and are negative. ?  ?  ?Medical History: ?Past Medical History ?Past Medical History: ?Diagnosis Date ? DVT (deep venous thrombosis) (CMS-HCC)   ? Pulmonary hypertension (CMS-HCC)   ? ?  ?  ?There is no problem list on file for this patient. ?  ?  ?Past Surgical History ?Past Surgical History: ?Procedure Laterality Date ? accoustic nuroma      ? detatched retina     ? HERNIA REPAIR     ? ?  ?  ?Allergies ?Allergies ?Allergen Reactions ? Cefuroxime Other (See Comments) ?    dehydration ?Severe dehydration ?Other reaction(s): unsure ?Other reaction(s): Other (See Comments) ?dehydration ?  ? Poison Ivy Extract Rash ? ?  ?  ?Current Outpatient Medications on File Prior to Visit ?Medication Sig Dispense Refill ? mesalamine (LIALDA) 1.2 gram EC tablet 2 tablets     ? propranoloL (INDERAL) 20 MG tablet 2 tablets     ? rosuvastatin (CRESTOR) 20 MG tablet 1 tablet     ? gluc-chondr-collagen-D3-C-Mn [id number] mg-mg-unit Cap 1 tablet     ? multivitamin liquid Take by mouth     ? omega 3-dha-epa-fish oil (FISH OIL) 1,000 mg (120 mg-180 mg) Cap 2 capusles     ? rivaroxaban (XARELTO) 20 mg tablet 1 tablet     ? vitamin D3-vit K1-vit MK4-MK7 50-500-1,500 mcg Cap Take by mouth     ?  ?No current facility-administered medications on file prior to visit. ?  ?  ?Family History ?Family History ?Problem Relation Age of Onset ? High blood pressure (Hypertension) Mother   ? Coronary Artery Disease (Blocked arteries around heart) Father   ? Obesity Sister   ? High blood pressure (Hypertension) Sister   ? ?  ?  ?Social History ?  ?Tobacco Use ?Smoking Status Former ? Types: Cigarettes ? Quit date: 23 ? Years since quitting: 43.2 ?Smokeless Tobacco Never ?  ?  ?Social History ?Social History ?  ? ?Socioeconomic  History ? Marital status: Married ?Tobacco Use ? Smoking status: Former ?    Types: Cigarettes ?    Quit date: 13 ?    Years since quitting: 43.2 ? Smokeless tobacco: Never ?Substance and Sexual Activity ? Alcohol use: Yes ? Drug use: Never ? ?  ?  ?Objective: ?  ?Vitals: ?  02/24/22 1053 ?Pulse: 52 ?Temp: 36.5 ?C (97.7 ?F) ?SpO2: 99% ?Weight: 91.1 kg (200 lb 12.8 oz) ?Height: 175.3 cm (5' 9" ) ?  ?Body mass index is 29.65 kg/m?. ?  ?Physical Exam ?Constitutional:   ?   General: He is not in acute distress. ?   Appearance: Normal appearance.  ?HENT:  ?   Head:  Normocephalic.  ?   Nose: No rhinorrhea.  ?   Mouth/Throat:  ?   Mouth: Mucous membranes are moist.  ?   Pharynx: Oropharynx is clear.  ?Eyes:  ?   General: No scleral icterus. ?   Pupils: Pupils are equal, round, and reactive to light.  ?Cardiovascular:  ?   Rate and Rhythm: Normal rate.  ?   Pulses: Normal pulses.  ?Pulmonary:  ?   Effort: Pulmonary effort is normal. No respiratory distress.  ?   Breath sounds: No stridor. No wheezing.  ?Abdominal:  ?   General: Abdomen is flat. There is no distension.  ?   Tenderness: There is no abdominal tenderness. There is no guarding or rebound.  ?Musculoskeletal:     ?   General: Normal range of motion.  ?   Cervical back: Normal range of motion and neck supple.  ?Skin: ?   General: Skin is warm and dry.  ?   Capillary Refill: Capillary refill takes less than 2 seconds.  ?   Coloration: Skin is not jaundiced.  ?Neurological:  ?   General: No focal deficit present.  ?   Mental Status: He is alert and oriented to person, place, and time. Mental status is at baseline.  ?Psychiatric:     ?   Mood and Affect: Mood normal.     ?   Thought Content: Thought content normal.     ?   Judgment: Judgment normal.  ?  ?  ?  ?  ?  ?Assessment and Plan: ?   ?Diagnoses and all orders for this visit: ?  ?Primary hyperparathyroidism (CMS-HCC) ?  ?  ?  ?Patient is a 71 year old male with a history of hyperparathyroidism. ?Patient's calciums have been elevated in the past.  Patient has had elevated PTH levels however the most recent 1 is normal. ?We will proceed to the OR for a min L inf parathyroidecomty. ? ?All risks and benefits were discussed with the patient to generally include: infection, bleeding, damage to the recurrent laryngeal nerve, damage to superior and inferior parathyroid glands.  Alternatives were offered and described.  All questions were answered and the patient voiced understanding of the procedure and wishes to proceed at this point with L inferior parathyroidectomy. ? ? ?

## 2022-03-28 ENCOUNTER — Ambulatory Visit (INDEPENDENT_AMBULATORY_CARE_PROVIDER_SITE_OTHER): Payer: PPO | Admitting: Family Medicine

## 2022-03-28 VITALS — BP 148/76 | HR 50 | Temp 97.9°F | Ht 69.0 in | Wt 198.8 lb

## 2022-03-28 DIAGNOSIS — G250 Essential tremor: Secondary | ICD-10-CM | POA: Diagnosis not present

## 2022-03-28 DIAGNOSIS — I2699 Other pulmonary embolism without acute cor pulmonale: Secondary | ICD-10-CM

## 2022-03-28 DIAGNOSIS — Z23 Encounter for immunization: Secondary | ICD-10-CM | POA: Diagnosis not present

## 2022-03-28 DIAGNOSIS — E210 Primary hyperparathyroidism: Secondary | ICD-10-CM

## 2022-03-28 NOTE — Assessment & Plan Note (Signed)
Management per endocrinology.  We will have upcoming parathyroidectomy. ?

## 2022-03-28 NOTE — Patient Instructions (Signed)
It was very nice to see you today! ? ?We will clear you for surgery.  ? ?Please stop the xarelto 2 days prior to your surgery and restart on postoperative day 2.  You should be off medication for 4 days total. ? ?Take care, ?Dr [name] ? ?PLEASE NOTE: ? ?If you had any lab tests please let us know if you have not heard back within a few days. You may see your results on mychart before we have a chance to review them but we will give you a call once they are reviewed by [place]. If we ordered any referrals today, please let us know if you have not heard from their office within the next week.  ? ?Please try these tips to maintain a healthy lifestyle: ? ?Eat at least 3 REAL meals and 1-2 snacks per day.  Aim for no more than 5 hours between eating.  If you eat breakfast, please do so within one hour of getting up.  ? ?Each meal should contain half fruits/vegetables, one quarter protein, and one quarter carbs (no bigger than a computer mouse) ? ?Cut down on sweet beverages. This includes juice, soda, and sweet tea.  ? ?Drink at least 1 glass of water with each meal and aim for at least 8 glasses per day ? ?Exercise at least 150 minutes every week.   ?

## 2022-03-28 NOTE — Progress Notes (Signed)
? ?Shaun Armstrong is a 71 y.o. male who presents today for an office visit. ? ?Assessment/Plan:  ?New/Acute Problems: ?Encounter For Surgical Clearance ?Patient with overall low cardiac risk.  He is on chronic anticoagulation due to history of PE.  He can hold this for 2 days prior to surgery and then restart on postoperative day 2.  We will fax clearance letter to his surgeon. ? ?Chronic Problems Addressed Today: ?Bilateral pulmonary embolism ([place]) ?Anticoagulated on Xarelto.  He can pause his anticoagulation for 4 days perioperatively. ? ?Hyperparathyroidism, primary ([place]) ?Management per endocrinology.  We will have upcoming parathyroidectomy. ? ?Essential tremor ?Propranolol 40 mg daily.  He is tolerating well.  His heart rate is in the 50s today does not have any symptoms or side effects.  We will continue propranolol for now. ? ?  ?Subjective:  ?HPI: ? ?Patient here for surgical clearfance. He will be undergoing parathyroidectomy due to persistent hyperparathyroidism.  Date has not yet been set but hopefully will better have this done soon.  He has been following with endocrinology for his hyperparathyroidism.  He has excellent functional status.  Able to climb a flight of stairs without chest pain or shortness of breath. ? ?ROS: Per HPI, otherwise a complete review of systems was negative.  ? ?PMH: ? ?The following were reviewed and entered/updated in epic: ?Past Medical History:  ?Diagnosis Date  ? 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  ? Crohn's disease ([place])   ? Deafness in right ear, wears a microphone   ? Decreased peripheral vision 11/04/2011  ? Essential tremor   ? HLD (hyperlipidemia)   ? Inguinal hernia 07/2016  ? Inguinal hernia   ? Retinal detachment   ? Wears hearing aid, left ear   ? ?Patient Active Problem List  ? Diagnosis Date Noted  ? Bilateral pulmonary embolism ([place]) 10/05/2020  ? Acute deep vein thrombosis (DVT) of proximal vein of right lower extremity ([place]) 10/05/2020  ? Hyperparathyroidism,  primary ([place]) 06/07/2020  ? Hypercalcemia 06/07/2020  ? Meralgia paresthetica of right side 10/25/2018  ? Gastroesophageal reflux disease without esophagitis 10/25/2018  ? Sun-damaged skin 08/03/2017  ? Essential tremor 06/05/2016  ? 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  ? Crohn's disease of both small and large intestine without complication ([place]) 01/74/9449  ? Hyperlipidemia 10/05/2012  ? Decreased peripheral vision 11/04/2011  ? ?Past Surgical History:  ?Procedure Laterality Date  ? ACOUSTIC NEUROMA RESECTION Right   ? BROW LIFT AND BLEPHAROPLASTY Bilateral 10/11/2012  ? CATARACT EXTRACTION W/ INTRAOCULAR LENS  IMPLANT, BILATERAL Bilateral   ? HERNIA REPAIR    ? INGUINAL HERNIA REPAIR Right 07/16/2016  ? Procedure: LAPAROSCOPIC RIGHT INGUINAL HERNIA;  Surgeon: Coralie Keens, MD;  Location: [facility];  Service: General;  Laterality: Right;  ? INSERTION OF MESH Right 07/16/2016  ? Procedure: INSERTION OF MESH;  Surgeon: Coralie Keens, MD;  Location: [facility];  Service: General;  Laterality: Right;  ? MOHS SURGERY N/A 11/05/2021  ? scalp  ? RETINAL DETACHMENT SURGERY Left   ? TONSILLECTOMY    ? ? ?Family History  ?Problem Relation Age of Onset  ? Diabetes Mother   ? Dementia Mother   ? Diabetes Sister   ? Hyperparathyroidism Neg Hx   ? ? ?Medications- reviewed and updated ?Current Outpatient Medications  ?Medication Sig Dispense Refill  ? Cholecalciferol (VITAMIN D) 125 MCG (5000 UT) CAPS Take 1 capsule by mouth every 3 (three) days.    ? COVID-19 mRNA bivalent vaccine, [place], ([place] COVID-19  VAC BIVALENT) injection Inject into the muscle. 0.3 mL 0  ? glucosamine-chondroitin 500-400 MG tablet Take 1 tablet by mouth daily.     ? mesalamine (LIALDA) 1.2 g EC tablet Take 2 tablets by mouth once a day 120 tablet 2  ? Multiple Vitamin (MULTIVITAMIN) tablet Take 1 tablet by mouth daily.    ? Omega-3 Fatty Acids (OMEGA 3 PO) Take 2 capsules by mouth daily.    ? Polyethyl Glycol-Propyl  Glycol (SYSTANE FREE OP) Place 2 drops into both eyes 5 (five) times daily as needed (dry eyes).    ? propranolol (INDERAL) 20 MG tablet TAKE TWO TABLETS BY MOUTH DAILY 180 tablet 3  ? rivaroxaban (XARELTO) 20 MG TABS tablet TAKE 1 TABLET BY MOUTH DAILY WITH SUPPER 30 tablet 2  ? rosuvastatin (CRESTOR) 20 MG tablet TAKE 1 TABLET BY MOUTH ONCE DAILY 90 tablet 0  ? ?No current facility-administered medications for this visit.  ? ? ?Allergies-reviewed and updated ?Allergies  ?Allergen Reactions  ? Cefuroxime Other (See Comments)  ?  Severe dehydration ?Other reaction(s): unsure  ? Cefuroxime Axetil   ?  Other reaction(s): Other (See Comments) ?dehydration  ? Poison Ivy Extract Rash  ? ? ?Social History  ? ?Socioeconomic History  ? Marital status: Married  ?  Spouse name: Not on file  ? Number of children: Not on file  ? Years of education: Not on file  ? Highest education level: Not on file  ?Occupational History  ? Occupation: retired  ?Tobacco Use  ? Smoking status: Former  ?  Packs/day: 1.00  ?  Years: 4.00  ?  Pack years: 4.00  ?  Types: Cigarettes  ?  Quit date: 12/09/1979  ?  Years since quitting: 42.3  ? Smokeless tobacco: Never  ? Tobacco comments:  ?  Quit 38 years ago  ?Vaping Use  ? Vaping Use: Never used  ?Substance and Sexual Activity  ? Alcohol use: Yes  ?  Comment: recent ER visit fall after having a few beers  ? Drug use: No  ? Sexual activity: Not on file  ?Other Topics Concern  ? Not on file  ?Social History Narrative  ? Not on file  ? ?Social Determinants of Health  ? ?Financial Resource Strain: Low Risk   ? Difficulty of Paying Living Expenses: Not hard at all  ?Food Insecurity: No Food Insecurity  ? Worried About Charity fundraiser in the Last Year: Never true  ? Ran Out of Food in the Last Year: Never true  ?Transportation Needs: No Transportation Needs  ? Lack of Transportation (Medical): No  ? Lack of Transportation (Non-Medical): No  ?Physical Activity: Sufficiently Active  ? Days of Exercise  per Week: 4 days  ? Minutes of Exercise per Session: 60 min  ?Stress: No Stress Concern Present  ? Feeling of Stress : Not at all  ?Social Connections: Socially Integrated  ? Frequency of Communication with Friends and Family: More than three times a week  ? Frequency of Social Gatherings with Friends and Family: More than three times a week  ? Attends Religious Services: 1 to 4 times per year  ? Active Member of Clubs or Organizations: Yes  ? Attends Archivist Meetings: 1 to 4 times per year  ? Marital Status: Married  ? ? ? ?   ?  ?Objective:  ?Physical Exam: ?BP (!) 148/76 (BP Location: Left Arm)   Pulse (!) 50   Temp 97.9 ?F (36.6 ?C) (Temporal)  Ht 5' 9"  (1.753 m)   Wt 198 lb 12.8 oz (90.2 kg)   SpO2 98%   BMI 29.36 kg/m?   ?Gen: No acute distress, resting comfortably ?CV: Regular rate and rhythm with no murmurs appreciated ?Pulm: Normal work of breathing, clear to auscultation bilaterally with no crackles, wheezes, or rhonchi ?Neuro: Grossly normal, moves all extremities ?Psych: Normal affect and thought content ? ?EKG: Sinus bradycardia. No ischemic changes ? ?   ? ?Algis Greenhouse. [name], MD ?03/28/2022 11:16 AM  ?

## 2022-03-28 NOTE — Assessment & Plan Note (Signed)
Propranolol 40 mg daily.  He is tolerating well.  His heart rate is in the 50s today does not have any symptoms or side effects.  We will continue propranolol for now. ?

## 2022-03-28 NOTE — Assessment & Plan Note (Signed)
Anticoagulated on Xarelto.  He can pause his anticoagulation for 4 days perioperatively. ?

## 2022-04-03 ENCOUNTER — Other Ambulatory Visit (HOSPITAL_BASED_OUTPATIENT_CLINIC_OR_DEPARTMENT_OTHER): Payer: Self-pay

## 2022-04-28 NOTE — Progress Notes (Signed)
Surgical Instructions    Your procedure is scheduled on Wednesday May 31st .  Report to [facility] Main Entrance "A" at 7:15 A.M., then check in with the Admitting office.  Call this number if you have problems the morning of surgery:  [phone]   If you have any questions prior to your surgery date call [phone]: Open Monday-Friday 8am-4pm    Remember:  Do not eat after midnight the night before your surgery  You may drink clear liquids until 6:15am the morning of your surgery.   Clear liquids allowed are: Water, Non-Citrus Juices (without pulp), Carbonated Beverages, Clear Tea, Black Coffee ONLY (NO MILK, CREAM OR POWDERED CREAMER of any kind), and Gatorade   Enhanced Recovery after Surgery for Orthopedics Enhanced Recovery after Surgery is a protocol used to improve the stress on your body and your recovery after surgery.  Patient Instructions  The day of surgery (if you do NOT have diabetes):  Drink ONE (1) Pre-Surgery Clear Ensure by __4:30___ am the morning of surgery   This drink was given to you during your hospital  pre-op appointment visit. Nothing else to drink after completing the  Pre-Surgery Clear Ensure.      Take these medicines the morning of surgery with A SIP OF WATER: mesalamine (LIALDA) 1.2 g EC tablet propranolol (INDERAL) 20 MG tablet rosuvastatin (CRESTOR) 20 MG tablet  IF NEEDED  Polyethyl Glycol-Propyl Glycol (SYSTANE FREE OP)   Follow your surgeon's instructions on when to stop Xarelto.  If no instructions were given by your surgeon then you will need to call the office to get those instructions.      As of today, STOP taking any Aspirin (unless otherwise instructed by your surgeon) Aleve, Naproxen, Ibuprofen, Motrin, Advil, Goody's, BC's, all herbal medications, fish oil, and all vitamins.           Do not wear jewelry or makeup Do not wear lotions, powders, perfumes, or deodorant. Do not shave 48 hours prior to surgery.   Do not  bring valuables to the hospital. Do not wear nail polish, gel polish, artificial nails, or any other type of covering on natural nails (fingers and toes) If you have artificial nails or gel coating that need to be removed by a nail salon, please have this removed prior to surgery. Artificial nails or gel coating may interfere with anesthesia's ability to adequately monitor your vital signs.  [facility] is not responsible for any belongings or valuables. .   Do NOT Smoke (Tobacco/Vaping)  24 hours prior to your procedure  If you use a CPAP at night, you may bring your mask for your overnight stay.   Contacts, glasses, hearing aids, dentures or partials may not be worn into surgery, please bring cases for these belongings   For patients admitted to the hospital, discharge time will be determined by your treatment team.   Patients discharged the day of surgery will not be allowed to drive home, and someone needs to stay with them for 24 hours.   SURGICAL WAITING ROOM VISITATION Patients having surgery or a procedure in a hospital may have two support people. Children under the age of 49 must have an adult with them who is not the patient. They may stay in the waiting area during the procedure and may switch out with other visitors. If the patient needs to stay at the hospital during part of their recovery, the visitor guidelines for inpatient rooms apply.  Please refer to the [facility] website  for the visitor guidelines for Inpatients (after your surgery is over and you are in a regular room).       Special instructions:    Oral Hygiene is also important to reduce your risk of infection.  Remember - BRUSH YOUR TEETH THE MORNING OF SURGERY WITH YOUR REGULAR TOOTHPASTE   [facility]- Preparing For Surgery  Before surgery, you can play an important role. Because skin is not sterile, your skin needs to be as free of germs as possible. You can reduce the number of germs on your skin by  washing with CHG (chlorahexidine gluconate) Soap before surgery.  CHG is an antiseptic cleaner which kills germs and bonds with the skin to continue killing germs even after washing.     Please do not use if you have an allergy to CHG or antibacterial soaps. If your skin becomes reddened/irritated stop using the CHG.  Do not shave (including legs and underarms) for at least 48 hours prior to first CHG shower. It is OK to shave your face.  Please follow these instructions carefully.     Shower the NIGHT BEFORE SURGERY and the MORNING OF SURGERY with CHG Soap.   If you chose to wash your hair, wash your hair first as usual with your normal shampoo. After you shampoo, rinse your hair and body thoroughly to remove the shampoo.  Then [employer] and genitals (private parts) with your normal soap and rinse thoroughly to remove soap.  After that Use CHG Soap as you would any other liquid soap. You can apply CHG directly to the skin and wash gently with a scrungie or a clean washcloth.   Apply the CHG Soap to your body ONLY FROM THE NECK DOWN.  Do not use on open wounds or open sores. Avoid contact with your eyes, ears, mouth and genitals (private parts). Wash Face and genitals (private parts)  with your normal soap.   Wash thoroughly, paying special attention to the area where your surgery will be performed.  Thoroughly rinse your body with warm water from the neck down.  DO NOT shower/wash with your normal soap after using and rinsing off the CHG Soap.  Pat yourself dry with a CLEAN TOWEL.  Wear CLEAN PAJAMAS to bed the night before surgery  Place CLEAN SHEETS on your bed the night before your surgery  DO NOT SLEEP WITH PETS.   Day of Surgery:  Take a shower with CHG soap. Wear Clean/Comfortable clothing the morning of surgery Do not apply any deodorants/lotions.   Remember to brush your teeth WITH YOUR REGULAR TOOTHPASTE.    If you received a COVID test during your pre-op visit, it  is requested that you wear a mask when out in public, stay away from anyone that may not be feeling well, and notify your surgeon if you develop symptoms. If you have been in contact with anyone that has tested positive in the last 10 days, please notify your surgeon.    Please read over the following fact sheets that you were given.

## 2022-04-29 ENCOUNTER — Other Ambulatory Visit: Payer: Self-pay

## 2022-04-29 ENCOUNTER — Encounter (HOSPITAL_COMMUNITY)
Admission: RE | Admit: 2022-04-29 | Discharge: 2022-04-29 | Disposition: A | Discharge status: Home / Self Care | Payer: PPO | Source: Ambulatory Visit | Attending: General Surgery | Admitting: General Surgery

## 2022-04-29 ENCOUNTER — Encounter (HOSPITAL_COMMUNITY): Payer: Self-pay

## 2022-04-29 VITALS — BP 157/78 | HR 47 | Temp 98.3°F | Resp 18 | Ht 69.0 in | Wt 197.1 lb

## 2022-04-29 DIAGNOSIS — E785 Hyperlipidemia, unspecified: Secondary | ICD-10-CM | POA: Diagnosis not present

## 2022-04-29 DIAGNOSIS — Z01812 Encounter for preprocedural laboratory examination: Secondary | ICD-10-CM | POA: Insufficient documentation

## 2022-04-29 LAB — CBC
HCT: 49.5 % (ref 39.0–52.0)
Hemoglobin: 16.2 g/dL (ref 13.0–17.0)
MCH: 30.2 pg (ref 26.0–34.0)
MCHC: 32.7 g/dL (ref 30.0–36.0)
MCV: 92.2 fL (ref 80.0–100.0)
Platelets: 193 10*3/uL (ref 150–400)
RBC: 5.37 MIL/uL (ref 4.22–5.81)
RDW: 13.3 % (ref 11.5–15.5)
WBC: 7.9 10*3/uL (ref 4.0–10.5)
nRBC: 0 % (ref 0.0–0.2)

## 2022-04-29 NOTE — Progress Notes (Signed)
PCP - Dimas Chyle MD Cardiologist - Denies  PPM/ICD - Denies  Chest x-ray - NI EKG - 03/28/22 Stress Test - Denies ECHO - 10/05/20 Cardiac Cath - Denies  Sleep Study - Denies  DM - Denies  Blood Thinner Instructions: Per patient instructed to take last dose 05/04/22  ERAS Protcol - Yes PRE-SURGERY Ensure given  Anesthesia review: No  Patient denies shortness of breath, fever, cough and chest pain at PAT appointment   All instructions explained to the patient, with a verbal understanding of the material. Patient agrees to go over the instructions while at home for a better understanding.  The opportunity to ask questions was provided.

## 2022-05-06 NOTE — H&P (Signed)
Chief Complaint: Thyroid Problem     History of Present Illness: Shaun Armstrong is a 71 y.o. male who is seen today as an office consultation for evaluation of Thyroid Problem  .     Patient is a 71 year old male who comes in secondary to hypercalcemia, elevated PTH level, primary hyperparathyroidism.   Patient's been followed by Dr. Loanne Drilling.  Patient's had multiple calcium checks which have shown elevated calcium level.  Patient's most recent calcium level was 11.4.  Prior to this was 11.2.  Patient's most recent PTH level was 58 and normal however previously was 91, 94, 103.  Patient also with normal vitamin D levels in the past.   Patient denies any signs of cramps, palpitations, renal stones, pathological fractures.   Patient's had a previous surgery for inguinal hernia, cataract surgery and acoustic neuroma.        Review of Systems: A complete review of systems was obtained from the patient.  I have reviewed this information and discussed as appropriate with the patient.  See HPI as well for other ROS.   Review of Systems  Constitutional: Negative for fever.  HENT: Negative for congestion.   Eyes: Negative for blurred vision.  Respiratory: Negative for cough, shortness of breath and wheezing.   Cardiovascular: Negative for chest pain and palpitations.  Gastrointestinal: Negative for heartburn.  Genitourinary: Negative for dysuria.  Musculoskeletal: Negative for myalgias.  Skin: Negative for rash.  Neurological: Negative for dizziness and headaches.  Psychiatric/Behavioral: Negative for depression and suicidal ideas.  All other systems reviewed and are negative.     Medical History: Past Medical History Past Medical History: Diagnosis        Date            DVT (deep venous thrombosis) (CMS-HCC)              Pulmonary hypertension (CMS-HCC)                    There is no problem list on file for this patient.     Past Surgical History Past Surgical  History: Procedure       Laterality         Date            accoustic nuroma                                  detatched retina                                    HERNIA REPAIR                             Allergies Allergies Allergen           Reactions            Cefuroxime      Other (See Comments)                           dehydration Severe dehydration Other reaction(s): unsure Other reaction(s): Other (See Comments) dehydration              Poison Ivy Extract       Rash       Current Outpatient Medications on File Prior to Visit  Medication       Sig       Dispense         Refill            mesalamine (LIALDA) 1.2 gram EC tablet     2 tablets                                    propranoloL (INDERAL) 20 MG tablet           2 tablets                                    rosuvastatin (CRESTOR) 20 MG tablet         1 tablet                                      gluc-chondr-collagen-D3-C-Mn [phone] mg-mg-unit Cap         1 tablet                                      multivitamin liquid       Take by mouth                                      omega 3-dha-epa-fish oil (FISH OIL) 1,000 mg (120 mg-180 mg) Cap        2 capusles                                   rivaroxaban (XARELTO) 20 mg tablet           1 tablet                                      vitamin D3-vit K1-vit MK4-MK7 50-500-1,500 mcg Cap        Take by mouth                             No current facility-administered medications on file prior to visit.     Family History Family History Problem           Relation           Age of Onset            High blood pressure (Hypertension)   Mother              Coronary Artery Disease (Blocked arteries around heart)     Father               Obesity            Sister                High blood pressure (Hypertension)   Sister           Social History   Tobacco Use Smoking Status  Former            Types: Cigarettes            Quit date:        1980             Years since quitting:   43.2 Smokeless Tobacco   Never     Social History Social History     Socioeconomic History            Marital status:  Married Tobacco Use            Smoking status:          Former                           Types: Cigarettes                           Quit date:        1980                           Years since quitting:   43.2            Smokeless tobacco:    Never Substance and Sexual Activity            Alcohol use:    Yes            Drug use:        Never       Objective:   Vitals:              02/24/22 1053 Pulse:  52 Temp:  36.5 C (97.7 F) SpO2:  99% Weight:            91.1 kg (200 lb 12.8 oz) Height: 175.3 cm (5' 9" )   Body mass index is 29.65 kg/m.   Physical Exam Constitutional:      General: He is not in acute distress.    Appearance: Normal appearance.  HENT:     Head: Normocephalic.     Nose: No rhinorrhea.     Mouth/Throat:     Mouth: Mucous membranes are moist.     Pharynx: Oropharynx is clear.  Eyes:     General: No scleral icterus.    Pupils: Pupils are equal, round, and reactive to light.  Cardiovascular:     Rate and Rhythm: Normal rate.     Pulses: Normal pulses.  Pulmonary:     Effort: Pulmonary effort is normal. No respiratory distress.     Breath sounds: No stridor. No wheezing.  Abdominal:     General: Abdomen is flat. There is no distension.     Tenderness: There is no abdominal tenderness. There is no guarding or rebound.  Musculoskeletal:        General: Normal range of motion.     Cervical back: Normal range of motion and neck supple.  Skin:    General: Skin is warm and dry.     Capillary Refill: Capillary refill takes less than 2 seconds.     Coloration: Skin is not jaundiced.  Neurological:     General: No focal deficit present.     Mental Status: He is alert and oriented to person, place, and time. Mental status is at baseline.  Psychiatric:        Mood and Affect: Mood normal.  Thought  Content: Thought content normal.        Judgment: Judgment normal.            Assessment and Plan:    Diagnoses and all orders for this visit:   Primary hyperparathyroidism (CMS-HCC)       Patient is a 71 year old male with a history of hyperparathyroidism. Patient's calciums have been elevated in the past.  Patient has had elevated PTH levels however the most recent 1 is normal. We will proceed to the OR for a min L inf parathyroidecomty.   All risks and benefits were discussed with the patient to generally include: infection, bleeding, damage to the recurrent laryngeal nerve, damage to superior and inferior parathyroid glands.  Alternatives were offered and described.  All questions were answered and the patient voiced understanding of the procedure and wishes to proceed at this point with L inferior parathyroidectomy.

## 2022-05-07 ENCOUNTER — Other Ambulatory Visit: Payer: Self-pay

## 2022-05-07 ENCOUNTER — Observation Stay (HOSPITAL_COMMUNITY)
Admission: RE | Admit: 2022-05-07 | Discharge: 2022-05-08 | Disposition: A | Discharge status: Home / Self Care | Payer: PPO | Attending: General Surgery | Admitting: General Surgery

## 2022-05-07 ENCOUNTER — Encounter (HOSPITAL_COMMUNITY)
Admission: RE | Disposition: A | Discharge status: Home / Self Care | Payer: Self-pay | Source: Home / Self Care | Attending: General Surgery

## 2022-05-07 ENCOUNTER — Ambulatory Visit (HOSPITAL_BASED_OUTPATIENT_CLINIC_OR_DEPARTMENT_OTHER): Payer: PPO | Admitting: Certified Registered Nurse Anesthetist

## 2022-05-07 ENCOUNTER — Encounter (HOSPITAL_COMMUNITY): Payer: Self-pay | Admitting: General Surgery

## 2022-05-07 ENCOUNTER — Ambulatory Visit (HOSPITAL_COMMUNITY): Payer: PPO | Admitting: Certified Registered Nurse Anesthetist

## 2022-05-07 DIAGNOSIS — I10 Essential (primary) hypertension: Secondary | ICD-10-CM | POA: Diagnosis not present

## 2022-05-07 DIAGNOSIS — E210 Primary hyperparathyroidism: Secondary | ICD-10-CM | POA: Diagnosis not present

## 2022-05-07 DIAGNOSIS — E892 Postprocedural hypoparathyroidism: Secondary | ICD-10-CM

## 2022-05-07 DIAGNOSIS — Z79899 Other long term (current) drug therapy: Secondary | ICD-10-CM | POA: Diagnosis not present

## 2022-05-07 DIAGNOSIS — Z87891 Personal history of nicotine dependence: Secondary | ICD-10-CM | POA: Insufficient documentation

## 2022-05-07 DIAGNOSIS — Z86718 Personal history of other venous thrombosis and embolism: Secondary | ICD-10-CM | POA: Insufficient documentation

## 2022-05-07 DIAGNOSIS — Z9089 Acquired absence of other organs: Secondary | ICD-10-CM

## 2022-05-07 DIAGNOSIS — Z9889 Other specified postprocedural states: Secondary | ICD-10-CM

## 2022-05-07 HISTORY — PX: PARATHYROIDECTOMY: SHX19

## 2022-05-07 HISTORY — DX: Acquired absence of other organs: Z90.89

## 2022-05-07 SURGERY — PARATHYROIDECTOMY
Anesthesia: General | Site: Neck | Laterality: Left

## 2022-05-07 MED ORDER — ROCURONIUM BROMIDE 10 MG/ML (PF) SYRINGE
PREFILLED_SYRINGE | INTRAVENOUS | Status: AC
  Filled 2022-05-07: qty 10

## 2022-05-07 MED ORDER — BUPIVACAINE HCL (PF) 0.25 % IJ SOLN
INTRAMUSCULAR | Status: AC
  Filled 2022-05-07: qty 30

## 2022-05-07 MED ORDER — ONDANSETRON HCL 4 MG/2ML IJ SOLN: 4.0000 mg | Freq: Once | INTRAMUSCULAR | Status: DC | PRN

## 2022-05-07 MED ORDER — ONDANSETRON HCL 4 MG/2ML IJ SOLN
INTRAMUSCULAR | Status: DC | PRN
  Administered 2022-05-07: 4 mg via INTRAVENOUS

## 2022-05-07 MED ORDER — VANCOMYCIN HCL IN DEXTROSE 1-5 GM/200ML-% IV SOLN
1000.0000 mg | INTRAVENOUS | Status: AC
  Administered 2022-05-07: 1000 mg via INTRAVENOUS
  Filled 2022-05-07: qty 200

## 2022-05-07 MED ORDER — DEXAMETHASONE SODIUM PHOSPHATE 10 MG/ML IJ SOLN
INTRAMUSCULAR | Status: AC
  Filled 2022-05-07: qty 1

## 2022-05-07 MED ORDER — LIDOCAINE 2% (20 MG/ML) 5 ML SYRINGE
INTRAMUSCULAR | Status: DC | PRN
  Administered 2022-05-07: 100 mg via INTRAVENOUS

## 2022-05-07 MED ORDER — ONDANSETRON 4 MG PO TBDP: 4.0000 mg | ORAL_TABLET | Freq: Four times a day (QID) | ORAL | Status: DC | PRN

## 2022-05-07 MED ORDER — PHENOL 1.4 % MT LIQD: 1.0000 | OROMUCOSAL | Status: DC | PRN

## 2022-05-07 MED ORDER — ENSURE PRE-SURGERY PO LIQD: 296.0000 mL | Freq: Once | ORAL | Status: DC

## 2022-05-07 MED ORDER — GLYCOPYRROLATE PF 0.2 MG/ML IJ SOSY
PREFILLED_SYRINGE | INTRAMUSCULAR | Status: AC
  Filled 2022-05-07: qty 2

## 2022-05-07 MED ORDER — CHLORHEXIDINE GLUCONATE CLOTH 2 % EX PADS: 6.0000 | MEDICATED_PAD | Freq: Once | CUTANEOUS | Status: DC

## 2022-05-07 MED ORDER — GLYCOPYRROLATE 0.2 MG/ML IJ SOLN
INTRAMUSCULAR | Status: DC | PRN
  Administered 2022-05-07: .2 mg via INTRAVENOUS

## 2022-05-07 MED ORDER — ROCURONIUM BROMIDE 100 MG/10ML IV SOLN
INTRAVENOUS | Status: DC | PRN
  Administered 2022-05-07: 90 mg via INTRAVENOUS

## 2022-05-07 MED ORDER — ONDANSETRON HCL 4 MG/2ML IJ SOLN
INTRAMUSCULAR | Status: AC
  Filled 2022-05-07: qty 2

## 2022-05-07 MED ORDER — PROPRANOLOL HCL 20 MG PO TABS
20.0000 mg | ORAL_TABLET | Freq: Two times a day (BID) | ORAL | Status: DC
  Administered 2022-05-07 – 2022-05-08 (×2): 20 mg via ORAL
  Filled 2022-05-07 (×2): qty 1

## 2022-05-07 MED ORDER — MIDAZOLAM HCL 2 MG/2ML IJ SOLN
INTRAMUSCULAR | Status: DC | PRN
  Administered 2022-05-07: 2 mg via INTRAVENOUS

## 2022-05-07 MED ORDER — DEXAMETHASONE SODIUM PHOSPHATE 10 MG/ML IJ SOLN
INTRAMUSCULAR | Status: DC | PRN
  Administered 2022-05-07: 4 mg via INTRAVENOUS

## 2022-05-07 MED ORDER — PROPOFOL 10 MG/ML IV BOLUS
INTRAVENOUS | Status: DC | PRN
  Administered 2022-05-07: 140 mg via INTRAVENOUS

## 2022-05-07 MED ORDER — CHLORHEXIDINE GLUCONATE 0.12 % MT SOLN
15.0000 mL | Freq: Once | OROMUCOSAL | Status: AC
  Administered 2022-05-07: 15 mL via OROMUCOSAL
  Filled 2022-05-07: qty 15

## 2022-05-07 MED ORDER — 0.9 % SODIUM CHLORIDE (POUR BTL) OPTIME
TOPICAL | Status: DC | PRN
  Administered 2022-05-07: 1000 mL

## 2022-05-07 MED ORDER — LACTATED RINGERS IV SOLN: INTRAVENOUS | Status: DC

## 2022-05-07 MED ORDER — OXYCODONE HCL 5 MG PO TABS: 5.0000 mg | ORAL_TABLET | Freq: Once | ORAL | Status: DC | PRN

## 2022-05-07 MED ORDER — FENTANYL CITRATE (PF) 250 MCG/5ML IJ SOLN
INTRAMUSCULAR | Status: AC
  Filled 2022-05-07: qty 5

## 2022-05-07 MED ORDER — KETOROLAC TROMETHAMINE 15 MG/ML IJ SOLN: 15.0000 mg | Freq: Four times a day (QID) | INTRAMUSCULAR | Status: DC | PRN

## 2022-05-07 MED ORDER — EPHEDRINE SULFATE-NACL 50-0.9 MG/10ML-% IV SOSY
PREFILLED_SYRINGE | INTRAVENOUS | Status: DC | PRN
  Administered 2022-05-07: 10 mg via INTRAVENOUS
  Administered 2022-05-07: 15 mg via INTRAVENOUS

## 2022-05-07 MED ORDER — PHENYLEPHRINE 80 MCG/ML (10ML) SYRINGE FOR IV PUSH (FOR BLOOD PRESSURE SUPPORT)
PREFILLED_SYRINGE | INTRAVENOUS | Status: DC | PRN
  Administered 2022-05-07: 200 ug via INTRAVENOUS

## 2022-05-07 MED ORDER — CALCIUM CARBONATE ANTACID 500 MG PO CHEW: 1.0000 | CHEWABLE_TABLET | Freq: Every evening | ORAL | Status: DC | PRN

## 2022-05-07 MED ORDER — OXYCODONE HCL 5 MG/5ML PO SOLN: 5.0000 mg | Freq: Once | ORAL | Status: DC | PRN

## 2022-05-07 MED ORDER — BUPIVACAINE HCL (PF) 0.25 % IJ SOLN
INTRAMUSCULAR | Status: DC | PRN
  Administered 2022-05-07: 30 mL

## 2022-05-07 MED ORDER — SODIUM CHLORIDE 0.9 % IV SOLN: INTRAVENOUS | Status: DC

## 2022-05-07 MED ORDER — GLUCOSAMINE-CHONDROITIN 500-400 MG PO TABS: 1.0000 | ORAL_TABLET | Freq: Every day | ORAL | Status: DC

## 2022-05-07 MED ORDER — ORAL CARE MOUTH RINSE: 15.0000 mL | Freq: Once | OROMUCOSAL | Status: AC

## 2022-05-07 MED ORDER — LIDOCAINE 2% (20 MG/ML) 5 ML SYRINGE
INTRAMUSCULAR | Status: AC
  Filled 2022-05-07: qty 5

## 2022-05-07 MED ORDER — PROPOFOL 10 MG/ML IV BOLUS
INTRAVENOUS | Status: AC
  Filled 2022-05-07: qty 20

## 2022-05-07 MED ORDER — ACETAMINOPHEN 500 MG PO TABS
1000.0000 mg | ORAL_TABLET | ORAL | Status: AC
  Administered 2022-05-07: 1000 mg via ORAL
  Filled 2022-05-07: qty 2

## 2022-05-07 MED ORDER — AMISULPRIDE (ANTIEMETIC) 5 MG/2ML IV SOLN: 10.0000 mg | Freq: Once | INTRAVENOUS | Status: DC | PRN

## 2022-05-07 MED ORDER — SUGAMMADEX SODIUM 200 MG/2ML IV SOLN
INTRAVENOUS | Status: DC | PRN
  Administered 2022-05-07: 200 mg via INTRAVENOUS

## 2022-05-07 MED ORDER — FENTANYL CITRATE (PF) 250 MCG/5ML IJ SOLN
INTRAMUSCULAR | Status: DC | PRN
  Administered 2022-05-07: 100 ug via INTRAVENOUS
  Administered 2022-05-07: 50 ug via INTRAVENOUS

## 2022-05-07 MED ORDER — ONDANSETRON HCL 4 MG/2ML IJ SOLN: 4.0000 mg | Freq: Four times a day (QID) | INTRAMUSCULAR | Status: DC | PRN

## 2022-05-07 MED ORDER — FENTANYL CITRATE (PF) 100 MCG/2ML IJ SOLN: 25.0000 ug | INTRAMUSCULAR | Status: DC | PRN

## 2022-05-07 MED ORDER — MIDAZOLAM HCL 2 MG/2ML IJ SOLN
INTRAMUSCULAR | Status: AC
Start: 2022-05-07 — End: ?
  Filled 2022-05-07: qty 2

## 2022-05-07 SURGICAL SUPPLY — 46 items
ADH SKN CLS APL DERMABOND .7 (GAUZE/BANDAGES/DRESSINGS) ×1
APL PRP STRL LF DISP 70% ISPRP (MISCELLANEOUS) ×1
BAG COUNTER SPONGE SURGICOUNT (BAG) ×3 IMPLANT
BAG SPNG CNTER NS LX DISP (BAG) ×1
CANISTER SUCT 3000ML PPV (MISCELLANEOUS) ×3 IMPLANT
CHLORAPREP W/TINT 26 (MISCELLANEOUS) ×3 IMPLANT
CLIP VESOCCLUDE MED 6/CT (CLIP) ×4 IMPLANT
CLIP VESOCCLUDE SM WIDE 6/CT (CLIP) ×4 IMPLANT
CNTNR URN SCR LID CUP LEK RST (MISCELLANEOUS) ×2 IMPLANT
CONT SPEC 4OZ STRL OR WHT (MISCELLANEOUS) ×2
COVER SURGICAL LIGHT HANDLE (MISCELLANEOUS) ×3 IMPLANT
DERMABOND ADVANCED (GAUZE/BANDAGES/DRESSINGS) ×1
DERMABOND ADVANCED .7 DNX12 (GAUZE/BANDAGES/DRESSINGS) IMPLANT
DRAPE LAPAROTOMY 100X72 PEDS (DRAPES) ×3 IMPLANT
DRSG TELFA 3X8 NADH (GAUZE/BANDAGES/DRESSINGS) ×2 IMPLANT
ELECT COATED BLADE 2.86 ST (ELECTRODE) ×3 IMPLANT
ELECT REM PT RETURN 9FT ADLT (ELECTROSURGICAL) ×2
ELECTRODE REM PT RTRN 9FT ADLT (ELECTROSURGICAL) ×2 IMPLANT
GAUZE 4X4 16PLY ~~LOC~~+RFID DBL (SPONGE) ×3 IMPLANT
GAUZE SPONGE 2X2 8PLY STRL LF (GAUZE/BANDAGES/DRESSINGS) ×2 IMPLANT
GLOVE BIO SURGEON STRL SZ7.5 (GLOVE) ×3 IMPLANT
GLOVE BIOGEL PI IND STRL 8 (GLOVE) IMPLANT
GLOVE BIOGEL PI INDICATOR 8 (GLOVE) ×1
GOWN STRL REUS W/ TWL LRG LVL3 (GOWN DISPOSABLE) ×4 IMPLANT
GOWN STRL REUS W/TWL LRG LVL3 (GOWN DISPOSABLE) ×4
HEMOSTAT SURGICEL 2X4 FIBR (HEMOSTASIS) ×3 IMPLANT
ILLUMINATOR WAVEGUIDE N/F (MISCELLANEOUS) ×2 IMPLANT
KIT BASIN OR (CUSTOM PROCEDURE TRAY) ×3 IMPLANT
KIT TURNOVER KIT B (KITS) ×3 IMPLANT
NDL HYPO 25GX1X1/2 BEV (NEEDLE) IMPLANT
NEEDLE HYPO 25GX1X1/2 BEV (NEEDLE) ×2 IMPLANT
NS IRRIG 1000ML POUR BTL (IV SOLUTION) ×3 IMPLANT
PACK GENERAL/GYN (CUSTOM PROCEDURE TRAY) ×3 IMPLANT
PAD ARMBOARD 7.5X6 YLW CONV (MISCELLANEOUS) ×6 IMPLANT
PAD DRESSING TELFA 3X8 NADH (GAUZE/BANDAGES/DRESSINGS) IMPLANT
SPONGE GAUZE 2X2 STER 10/PKG (GAUZE/BANDAGES/DRESSINGS) ×1
SPONGE INTESTINAL PEANUT (DISPOSABLE) ×3 IMPLANT
STRIP CLOSURE SKIN 1/2X4 (GAUZE/BANDAGES/DRESSINGS) ×3 IMPLANT
SUT MNCRL AB 4-0 PS2 18 (SUTURE) ×3 IMPLANT
SUT SILK 2 0 (SUTURE)
SUT SILK 2-0 18XBRD TIE 12 (SUTURE) IMPLANT
SUT SILK 3 0 (SUTURE) ×2
SUT SILK 3-0 18XBRD TIE 12 (SUTURE) IMPLANT
SUT VIC AB 3-0 SH 18 (SUTURE) ×3 IMPLANT
SYR CONTROL 10ML LL (SYRINGE) ×1 IMPLANT
TOWEL GREEN STERILE (TOWEL DISPOSABLE) ×3 IMPLANT

## 2022-05-07 NOTE — Op Note (Signed)
05/07/2022  10:40 AM  PATIENT:  Shaun Armstrong  71 y.o. male  PRE-OPERATIVE DIAGNOSIS:  PRIMARY HYPERPARATHYROIDISM  POST-OPERATIVE DIAGNOSIS:  PRIMARY HYPERPARATHYROIDISM  PROCEDURE:  Procedure(s): MINIMALLY INVASIVE LEFT INFERIOR PARATHYROIDECTOMY (Left)  SURGEON:  Surgeon(s) and Role:    Ralene Ok, MD - Primary  ASSISTANTS: Ewell Poe, RNFA   ANESTHESIA:   local and general  EBL:  minimal   BLOOD ADMINISTERED:none  DRAINS: none   LOCAL MEDICATIONS USED:  BUPIVICAINE   SPECIMEN:  Source of Specimen:  L inferior parathryoid - confirmed by frozen pathology  DISPOSITION OF SPECIMEN:  PATHOLOGY  COUNTS:  YES  TOURNIQUET:  * No tourniquets in log *  DICTATION: .Dragon Dictation Details of the procedure:  The patient was taken back to the operating room. The patient was placed in supine position with bilateral SCDs in place.  The patient was prepped and draped in the usual sterile fashion. After appropriate anitbiotics were confirmed, a time-out was confirmed and all facts were verified. A left-sided 3 cm incision was made approximately 1 fingerbreadths above the sternal notch. Bovie cautery was used to maintain hemostasis dissection was carried down through the platysma. The platysma was elevated and flaps were created superiorly and inferiorly to the thyroid cartilage as well as the sternal notch, repsectively. The strap muscles were identified in the midline and separated. Left-sided strap muscles were elevated off the anterior surface of the thyroid. This dissection was carried laterally. We proceeded to dissect away the left thyroid lobe with Kitners from the surrounding musculature from the thyroid.   Upon dissecting left inferior edge of the parathyroid, an enlarge parathyroid gland was identified.  This was dissected out from the surrounding tissue.  Once it was removed it was sent to Pathology for a frozen section confirmation.  Pathology confirmed hyperplastic  parathyroid tissue.  The area was irrigated out. The dissection bed was hemostatic. We placed fibrillar hemostatic agent into the wound. Strap muscles were then reapproximated in the midline with interrupted 3-0 Vicryl stitches. The platysma was reapproximated using 3-0 Vicryl stitches in interrupted fashion. Skin was then reapproximated using a running subcuticular 4-0 Monocryl. The skin was then dressed with Dermabond.   The patient was taken to the recovery room in stable condition.    PLAN OF CARE: Admit for overnight observation  PATIENT DISPOSITION:  PACU - hemodynamically stable.   Delay start of Pharmacological VTE agent (>24hrs) due to surgical blood loss or risk of bleeding: not applicable

## 2022-05-07 NOTE — Interval H&P Note (Signed)
History and Physical Interval Note:  05/07/2022 8:33 AM  Shaun Armstrong  has presented today for surgery, with the diagnosis of PRIMARY HYPERPARATHYROIDISM.  The various methods of treatment have been discussed with the patient and family. After consideration of risks, benefits and other options for treatment, the patient has consented to  Procedure(s): MINIMALLY INVASIVE LEFT INFERIOR PARATHYROIDECTOMY (Left) as a surgical intervention.  The patient's history has been reviewed, patient examined, no change in status, stable for surgery.  I have reviewed the patient's chart and labs.  Questions were answered to the patient's satisfaction.     Ralene Ok

## 2022-05-07 NOTE — Transfer of Care (Addendum)
Immediate Anesthesia Transfer of Care Note  Patient: Shaun Armstrong  Procedure(s) Performed: MINIMALLY INVASIVE LEFT INFERIOR PARATHYROIDECTOMY (Left: Neck)  Patient Location: PACU  Anesthesia Type:General  Level of Consciousness: awake, alert  and oriented  Airway & Oxygen Therapy: Patient Spontanous Breathing and Patient connected to nasal cannula oxygen  Post-op Assessment: Report given to RN and Post -op Vital signs reviewed and stable  Post vital signs: Reviewed and stable  Last Vitals:  Vitals Value Taken Time  BP 156/97   Temp    Pulse 71 05/07/22 1102  Resp 13 05/07/22 1102  SpO2 99 % 05/07/22 1102  Vitals shown include unvalidated device data.  Last Pain:  Vitals:   05/07/22 0750  TempSrc:   PainSc: 0-No pain         Complications: No notable events documented.

## 2022-05-07 NOTE — Anesthesia Procedure Notes (Signed)
Procedure Name: Intubation Date/Time: 05/07/2022 9:30 AM Performed by: Anastasio Auerbach, CRNA Pre-anesthesia Checklist: Patient identified, Emergency Drugs available, Suction available and Patient being monitored Patient Re-evaluated:Patient Re-evaluated prior to induction Oxygen Delivery Method: Circle system utilized Preoxygenation: Pre-oxygenation with 100% oxygen Induction Type: IV induction Ventilation: Mask ventilation without difficulty Laryngoscope Size: Mac and 3 Grade View: Grade I Tube type: Oral Tube size: 8.0 mm Number of attempts: 1 Airway Equipment and Method: Stylet and Oral airway Placement Confirmation: ETT inserted through vocal cords under direct vision, positive ETCO2 and breath sounds checked- equal and bilateral Secured at: 23 cm Tube secured with: Tape Dental Injury: Teeth and Oropharynx as per pre-operative assessment

## 2022-05-07 NOTE — Interval H&P Note (Signed)
History and Physical Interval Note:  05/07/2022 8:36 AM  Shaun Armstrong  has presented today for surgery, with the diagnosis of PRIMARY HYPERPARATHYROIDISM.  The various methods of treatment have been discussed with the patient and family. After consideration of risks, benefits and other options for treatment, the patient has consented to  Procedure(s): MINIMALLY INVASIVE LEFT INFERIOR PARATHYROIDECTOMY (Left) as a surgical intervention.  The patient's history has been reviewed, patient examined, no change in status, stable for surgery.  I have reviewed the patient's chart and labs.  Questions were answered to the patient's satisfaction.     Ralene Ok

## 2022-05-07 NOTE — Anesthesia Preprocedure Evaluation (Signed)
Anesthesia Evaluation  Patient identified by MRN, date of birth, ID band Patient awake    Reviewed: Allergy & Precautions, NPO status , Patient's Chart, lab work & pertinent test results  History of Anesthesia Complications Negative for: history of anesthetic complications  Airway Mallampati: II  TM Distance: >3 FB Neck ROM: Full    Dental  (+) Dental Advisory Given, Teeth Intact   Pulmonary former smoker, PE   Pulmonary exam normal        Cardiovascular + DVT  Normal cardiovascular exam   Echo 10/05/20: EF 60-65%, no RWMA, normal DF, normal RVSF, mild AR, no AS   Neuro/Psych negative neurological ROS     GI/Hepatic Neg liver ROS, GERD  ,Crohn's disease   Endo/Other  Primary hyperparathyroidism  Renal/GU negative Renal ROS  negative genitourinary   Musculoskeletal negative musculoskeletal ROS (+)   Abdominal   Peds  Hematology negative hematology ROS (+)   Anesthesia Other Findings Xarelto, last dose 5/27  Reproductive/Obstetrics                            Anesthesia Physical Anesthesia Plan  ASA: 2  Anesthesia Plan: General   Post-op Pain Management: Tylenol PO (pre-op)*   Induction: Intravenous  PONV Risk Score and Plan: 2 and Ondansetron, Dexamethasone, Treatment may vary due to age or medical condition and Midazolam  Airway Management Planned: Oral ETT  Additional Equipment: None  Intra-op Plan:   Post-operative Plan: Extubation in OR  Informed Consent: I have reviewed the patients History and Physical, chart, labs and discussed the procedure including the risks, benefits and alternatives for the proposed anesthesia with the patient or authorized representative who has indicated his/her understanding and acceptance.     Dental advisory given  Plan Discussed with:   Anesthesia Plan Comments:         Anesthesia Quick Evaluation

## 2022-05-08 ENCOUNTER — Other Ambulatory Visit (HOSPITAL_BASED_OUTPATIENT_CLINIC_OR_DEPARTMENT_OTHER): Payer: Self-pay

## 2022-05-08 ENCOUNTER — Encounter (HOSPITAL_COMMUNITY): Payer: Self-pay | Admitting: General Surgery

## 2022-05-08 DIAGNOSIS — E210 Primary hyperparathyroidism: Secondary | ICD-10-CM | POA: Diagnosis not present

## 2022-05-08 LAB — BASIC METABOLIC PANEL
Anion gap: 4 — ABNORMAL LOW (ref 5–15)
BUN: 14 mg/dL (ref 8–23)
CO2: 24 mmol/L (ref 22–32)
Calcium: 9.8 mg/dL (ref 8.9–10.3)
Chloride: 112 mmol/L — ABNORMAL HIGH (ref 98–111)
Creatinine, Ser: 0.91 mg/dL (ref 0.61–1.24)
GFR, Estimated: >60 mL/min (ref >60)
Glucose, Bld: 117 mg/dL — ABNORMAL HIGH (ref 70–99)
Potassium: 4.3 mmol/L (ref 3.5–5.1)
Sodium: 140 mmol/L (ref 135–145)

## 2022-05-08 MED ORDER — CALCIUM CARBONATE ANTACID 500 MG PO CHEW
1.0000 | CHEWABLE_TABLET | Freq: Three times a day (TID) | ORAL | 3 refills | Status: AC
  Filled 2022-05-08: qty 150, 50d supply, fill #0

## 2022-05-08 NOTE — Discharge Summary (Signed)
Physician Discharge Summary  Patient ID: Shaun Armstrong MRN: [id number] DOB/AGE: 1951/11/24 71 y.o.  Admit date: 05/07/2022 Discharge date: 05/08/2022  Admission Diagnoses: Status post left inferior parathyroidectomy for primary hyperparathyroidism  Discharge Diagnoses:  Principal Problem:   S/P parathyroidectomy [facility])   Discharged Condition: good  Hospital Course: Patient is a 71 year old male who comes in secondary to primary hyperparathyroidism.  This was localized to the left inferior parathyroid.  Patient underwent left inferior parathyroidectomy.  Please see op note for full details.  Postoperative patient did well.  Patient in no pain.  Patient had follow-up calcium on postop day 1.  This was found to be normal.  Patient had no subsequent muscle cramps or other signs of hypocalcemia.  Patient was otherwise detailed for discharge discharge home.  Consults: None  Significant Diagnostic Studies: None  Treatments: surgery: As above  Discharge Exam: Blood pressure 103/61, pulse (!) 59, temperature 98.5 F (36.9 C), temperature source Oral, resp. rate 14, height 5' 9"  (1.753 m), weight 90.7 kg, SpO2 92 %. General appearance: alert and cooperative Incision/Wound: Clean dry and intact.  Disposition:    Allergies as of 05/08/2022       Reactions   Cefuroxime Other (See Comments)   Severe dehydration, not certain if this is true   Poison Ivy Extract Rash        Medication List     TAKE these medications    calcium carbonate 500 MG chewable tablet Commonly known as: TUMS - dosed in mg elemental calcium Chew 1 tablet by mouth at bedtime as needed for indigestion or heartburn. What changed: Another medication with the same name was added. Make sure you understand how and when to take each.   calcium carbonate 500 MG chewable tablet Commonly known as: Tums Chew 1 tablet (200 mg of elemental calcium total) by mouth 3 (three) times daily. What changed: You were already  taking a medication with the same name, and this prescription was added. Make sure you understand how and when to take each.   Fish Oil 1000 MG Caps Take 2,000 mg by mouth daily.   glucosamine-chondroitin 500-400 MG tablet Take 1 tablet by mouth daily. With msm   mesalamine 1.2 g EC tablet Commonly known as: LIALDA Take 2 tablets by mouth once a day   multivitamin tablet Take 1 tablet by mouth daily.   propranolol 20 MG tablet Commonly known as: INDERAL TAKE TWO TABLETS BY MOUTH DAILY   rosuvastatin 20 MG tablet Commonly known as: CRESTOR TAKE 1 TABLET BY MOUTH ONCE DAILY   SYSTANE FREE OP Place 2 drops into both eyes 5 (five) times daily as needed (dry eyes).   Vitamin D 125 MCG (5000 UT) Caps Take 1 capsule by mouth once a week.   Xarelto 20 MG Tabs tablet Generic drug: rivaroxaban TAKE 1 TABLET BY MOUTH DAILY WITH SUPPER        Follow-up Information     Ralene Ok, MD. Schedule an appointment as soon as possible for a visit in 2 week(s).   Specialty: General Surgery Why: Post op visit Contact information: [address] [place] [address] [phone]                 Signed: Ralene Ok 05/08/2022, 6:41 AM

## 2022-05-08 NOTE — Anesthesia Postprocedure Evaluation (Signed)
Anesthesia Post Note  Patient: Shaun Armstrong  Procedure(s) Performed: MINIMALLY INVASIVE LEFT INFERIOR PARATHYROIDECTOMY (Left: Neck)     Patient location during evaluation: PACU Anesthesia Type: General Level of consciousness: awake and alert Pain management: pain level controlled Vital Signs Assessment: post-procedure vital signs reviewed and stable Respiratory status: spontaneous breathing, nonlabored ventilation and respiratory function stable Cardiovascular status: blood pressure returned to baseline and stable Postop Assessment: no apparent nausea or vomiting Anesthetic complications: no   No notable events documented.  Last Vitals:  Vitals:   05/08/22 0659 05/08/22 0827  BP: 112/71 123/66  Pulse: (!) 56 (!) 59  Resp: 16 18  Temp: 36.6 C 36.6 C  SpO2: 98% 99%    Last Pain:  Vitals:   05/08/22 0827  TempSrc: Oral  PainSc:    Pain Goal:                   Lidia Collum

## 2022-05-09 LAB — SURGICAL PATHOLOGY

## 2022-05-12 DIAGNOSIS — L918 Other hypertrophic disorders of the skin: Secondary | ICD-10-CM | POA: Diagnosis not present

## 2022-05-12 DIAGNOSIS — D1801 Hemangioma of skin and subcutaneous tissue: Secondary | ICD-10-CM | POA: Diagnosis not present

## 2022-05-12 DIAGNOSIS — L820 Inflamed seborrheic keratosis: Secondary | ICD-10-CM | POA: Diagnosis not present

## 2022-05-12 DIAGNOSIS — Z85820 Personal history of malignant melanoma of skin: Secondary | ICD-10-CM | POA: Diagnosis not present

## 2022-05-12 DIAGNOSIS — L821 Other seborrheic keratosis: Secondary | ICD-10-CM | POA: Diagnosis not present

## 2022-05-12 DIAGNOSIS — L813 Cafe au lait spots: Secondary | ICD-10-CM | POA: Diagnosis not present

## 2022-05-12 DIAGNOSIS — L570 Actinic keratosis: Secondary | ICD-10-CM | POA: Diagnosis not present

## 2022-05-12 DIAGNOSIS — D2272 Melanocytic nevi of left lower limb, including hip: Secondary | ICD-10-CM | POA: Diagnosis not present

## 2022-05-28 DIAGNOSIS — E210 Primary hyperparathyroidism: Secondary | ICD-10-CM | POA: Diagnosis not present

## 2022-06-06 ENCOUNTER — Other Ambulatory Visit (HOSPITAL_BASED_OUTPATIENT_CLINIC_OR_DEPARTMENT_OTHER): Payer: Self-pay

## 2022-06-06 ENCOUNTER — Emergency Department (HOSPITAL_COMMUNITY): Payer: PPO

## 2022-06-06 ENCOUNTER — Other Ambulatory Visit: Payer: Self-pay | Admitting: Family Medicine

## 2022-06-06 ENCOUNTER — Encounter (HOSPITAL_COMMUNITY): Payer: Self-pay

## 2022-06-06 ENCOUNTER — Emergency Department (INDEPENDENT_AMBULATORY_CARE_PROVIDER_SITE_OTHER)
Admission: EM | Admit: 2022-06-06 | Discharge: 2022-06-06 | Disposition: A | Discharge status: Home / Self Care | Payer: PPO | Source: Home / Self Care

## 2022-06-06 ENCOUNTER — Emergency Department (HOSPITAL_COMMUNITY)
Admission: EM | Admit: 2022-06-06 | Discharge: 2022-06-06 | Disposition: A | Discharge status: Home / Self Care | Payer: PPO | Attending: Emergency Medicine | Admitting: Emergency Medicine

## 2022-06-06 ENCOUNTER — Ambulatory Visit: Payer: PPO | Admitting: Family Medicine

## 2022-06-06 ENCOUNTER — Encounter: Payer: Self-pay | Admitting: *Deleted

## 2022-06-06 DIAGNOSIS — Z87891 Personal history of nicotine dependence: Secondary | ICD-10-CM | POA: Diagnosis not present

## 2022-06-06 DIAGNOSIS — W231XXA Caught, crushed, jammed, or pinched between stationary objects, initial encounter: Secondary | ICD-10-CM | POA: Insufficient documentation

## 2022-06-06 DIAGNOSIS — S61215A Laceration without foreign body of left ring finger without damage to nail, initial encounter: Secondary | ICD-10-CM | POA: Insufficient documentation

## 2022-06-06 DIAGNOSIS — Y9389 Activity, other specified: Secondary | ICD-10-CM | POA: Insufficient documentation

## 2022-06-06 DIAGNOSIS — S6992XA Unspecified injury of left wrist, hand and finger(s), initial encounter: Secondary | ICD-10-CM | POA: Diagnosis present

## 2022-06-06 DIAGNOSIS — Z7901 Long term (current) use of anticoagulants: Secondary | ICD-10-CM | POA: Diagnosis not present

## 2022-06-06 DIAGNOSIS — E785 Hyperlipidemia, unspecified: Secondary | ICD-10-CM

## 2022-06-06 MED ORDER — RIVAROXABAN 20 MG PO TABS
ORAL_TABLET | Freq: Every day | ORAL | 2 refills | Status: DC
  Filled 2022-06-06: qty 30, 30d supply, fill #0
  Filled 2022-07-14: qty 30, 30d supply, fill #1
  Filled 2022-09-29: qty 30, 30d supply, fill #2

## 2022-06-06 MED ORDER — LIDOCAINE-EPINEPHRINE (PF) 2 %-1:200000 IJ SOLN
20.0000 mL | Freq: Once | INTRAMUSCULAR | Status: AC
  Administered 2022-06-06: 20 mL
  Filled 2022-06-06: qty 20

## 2022-06-06 MED ORDER — DOXYCYCLINE HYCLATE 100 MG PO TABS
100.0000 mg | ORAL_TABLET | Freq: Once | ORAL | Status: AC
  Administered 2022-06-06: 100 mg via ORAL
  Filled 2022-06-06: qty 1

## 2022-06-06 MED ORDER — ROSUVASTATIN CALCIUM 20 MG PO TABS
ORAL_TABLET | Freq: Every day | ORAL | 0 refills | Status: DC
  Filled 2022-06-06: qty 90, 90d supply, fill #0

## 2022-06-06 MED ORDER — DOXYCYCLINE HYCLATE 100 MG PO CAPS: 100.0000 mg | ORAL_CAPSULE | Freq: Two times a day (BID) | ORAL | 0 refills | Status: AC

## 2022-06-06 NOTE — ED Notes (Signed)
Suture cart placed at bedside at this time

## 2022-06-06 NOTE — ED Provider Notes (Signed)
[facility] EMERGENCY DEPARTMENT Provider Note   CSN: [id number] Arrival date & time: 06/06/22  1450     History  Chief Complaint  Patient presents with   Finger Injury    Shaun Armstrong is a 71 y.o. male.  Patient as above with significant medical history as below, including essential tremor, Crohn's disease, HLD, hard of hearing who presents to the ED with complaint of finger injury.  Patient reports he was moving some stones yesterday evening around 8 PM when he crushed his left ring finger under a large stone.  Had a laceration to his fingertip.  He is on Eliquis.  Last tetanus shot was around 1 year ago.  He cleaned the wound copiously following the injury, wife applied some sort of antibiotic spray at that time as well.  Patient was seen in urgent care earlier today and sent to the ED for evaluation.  RHD.   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   Crohn's disease ([place])    Deafness in right ear, wears a microphone    Decreased peripheral vision 11/04/2011   Essential tremor    HLD (hyperlipidemia)    Inguinal hernia 07/2016   Retinal detachment    Wears hearing aid, left ear     Past Surgical History:  Procedure Laterality Date   ACOUSTIC NEUROMA RESECTION Right    BROW LIFT AND BLEPHAROPLASTY Bilateral 10/11/2012   CATARACT EXTRACTION W/ INTRAOCULAR LENS  IMPLANT, BILATERAL Bilateral    HERNIA REPAIR     INGUINAL HERNIA REPAIR Right 07/16/2016   Procedure: LAPAROSCOPIC RIGHT INGUINAL HERNIA;  Surgeon: Coralie Keens, MD;  Location: [place];  Service: General;  Laterality: Right;   INSERTION OF MESH Right 07/16/2016   Procedure: INSERTION OF MESH;  Surgeon: Coralie Keens, MD;  Location: [place];  Service: General;  Laterality: Right;   MELANOMA EXCISION  01/2022   scalp   MOHS SURGERY N/A 11/05/2021   scalp   PARATHYROIDECTOMY Left 05/07/2022   Procedure: MINIMALLY INVASIVE  LEFT INFERIOR PARATHYROIDECTOMY;  Surgeon: Ralene Ok, MD;  Location: [place];  Service: General;  Laterality: Left;   RETINAL DETACHMENT SURGERY Left    TONSILLECTOMY       The history is provided by the patient and the spouse. No language interpreter was used.       Home Medications Prior to Admission medications   Medication Sig Start Date End Date Taking? Authorizing Provider  doxycycline (VIBRAMYCIN) 100 MG capsule Take 1 capsule (100 mg total) by mouth 2 (two) times daily for 7 days. 06/06/22 06/13/22 Yes Wynona Dove A, DO  calcium carbonate (TUMS - DOSED IN MG ELEMENTAL CALCIUM) 500 MG chewable tablet Chew 1 tablet by mouth at bedtime as needed for indigestion or heartburn.    [provider]  calcium carbonate (TUMS) 500 MG chewable tablet Chew 1 tablet (200 mg of elemental calcium total) by mouth 3 (three) times daily. 05/08/22 05/08/23  Ralene Ok, MD  Cholecalciferol (VITAMIN D) 125 MCG (5000 UT) CAPS Take 1 capsule by mouth once a week.    [provider]  glucosamine-chondroitin 500-400 MG tablet Take 1 tablet by mouth daily. With msm    [provider]  mesalamine (LIALDA) 1.2 g EC tablet Take 2 tablets by mouth once a day 10/02/21     Multiple Vitamin (MULTIVITAMIN) tablet Take 1 tablet by mouth daily.    [provider]  Omega-3 Fatty Acids (  FISH OIL) 1000 MG CAPS Take 2,000 mg by mouth daily.    [provider]  Polyethyl Glycol-Propyl Glycol (SYSTANE FREE OP) Place 2 drops into both eyes 5 (five) times daily as needed (dry eyes).    [provider]  propranolol (INDERAL) 20 MG tablet TAKE TWO TABLETS BY MOUTH DAILY 01/27/22 01/27/23  Vivi Barrack, MD  rivaroxaban (XARELTO) 20 MG TABS tablet TAKE 1 TABLET BY MOUTH DAILY WITH SUPPER 06/06/22 06/06/23  Vivi Barrack, MD  rosuvastatin (CRESTOR) 20 MG tablet TAKE 1 TABLET BY MOUTH ONCE DAILY 06/06/22 06/06/23  Vivi Barrack, MD      Allergies    Cefuroxime and Poison  ivy extract    Review of Systems   Review of Systems  Constitutional:  Negative for chills and fever.  HENT:  Negative for ear pain and sore throat.   Eyes:  Negative for pain and visual disturbance.  Respiratory:  Negative for cough and shortness of breath.   Cardiovascular:  Negative for chest pain and palpitations.  Gastrointestinal:  Negative for abdominal pain and vomiting.  Genitourinary:  Negative for dysuria and hematuria.  Musculoskeletal:  Negative for arthralgias and back pain.  Skin:  Positive for wound. Negative for color change and rash.  Neurological:  Negative for seizures and syncope.  All other systems reviewed and are negative.   Physical Exam Updated Vital Signs BP (!) 144/77   Pulse (!) 49   Temp 98.9 F (37.2 C)   Resp 17   Ht 5' 9"  (1.753 m)   Wt 91 kg   SpO2 97%   BMI 29.63 kg/m  Physical Exam Vitals and nursing note reviewed.  Constitutional:      General: He is not in acute distress.    Appearance: He is well-developed.  HENT:     Head: Normocephalic and atraumatic.     Right Ear: External ear normal.     Left Ear: External ear normal.     Mouth/Throat:     Mouth: Mucous membranes are moist.  Eyes:     General: No scleral icterus. Cardiovascular:     Rate and Rhythm: Normal rate and regular rhythm.     Pulses: Normal pulses.     Heart sounds: Normal heart sounds.  Pulmonary:     Effort: Pulmonary effort is normal. No respiratory distress.     Breath sounds: Normal breath sounds.  Abdominal:     General: Abdomen is flat.     Palpations: Abdomen is soft.     Tenderness: There is no abdominal tenderness.  Musculoskeletal:        General: Normal range of motion.     Cervical back: Normal range of motion.     Right lower leg: No edema.     Left lower leg: No edema.     Comments: Laceration to distal aspect of left fourth digit, palmar.  Elbow intact.  No active bleeding.  Full range of motion to DIP and PIP.  Radial pulses equal  bilateral.  Skin:    General: Skin is warm and dry.     Capillary Refill: Capillary refill takes less than 2 seconds.  Neurological:     Mental Status: He is alert and oriented to person, place, and time.     GCS: GCS eye subscore is 4. GCS verbal subscore is 5. GCS motor subscore is 6.  Psychiatric:        Mood and Affect: Mood normal.  Behavior: Behavior normal.      ED Results / Procedures / Treatments   Labs (all labs ordered are listed, but only abnormal results are displayed) Labs Reviewed - No data to display  EKG None  Radiology DG Hand Complete Left  Result Date: 06/06/2022 CLINICAL DATA:  Crush injury with laceration to the left finger EXAM: LEFT HAND - COMPLETE 3+ VIEW COMPARISON:  None Available. FINDINGS: There is no evidence of fracture or dislocation. There is no evidence of arthropathy or other focal bone abnormality. Soft tissue injury to the distal aspect of the left ring finger. IMPRESSION: Soft tissue injury without evidence of fracture. Electronically Signed   By: Jacqulynn Cadet M.D.   On: 06/06/2022 15:48    Procedures .Marland KitchenLaceration Repair  Date/Time: 06/06/2022 10:27 PM  Performed by: Jeanell Sparrow, DO Authorized by: Jeanell Sparrow, DO   Consent:    Consent obtained:  Verbal   Consent given by:  Patient   Risks, benefits, and alternatives were discussed: yes     Risks discussed:  Infection, need for additional repair and nerve damage   Alternatives discussed:  No treatment and delayed treatment Universal protocol:    Procedure explained and questions answered to patient or proxy's satisfaction: yes     Patient identity confirmed:  Verbally with patient and arm band Anesthesia:    Anesthesia method:  Local infiltration   Local anesthetic:  Lidocaine 2% WITH epi Laceration details:    Location:  Finger   Finger location:  L ring finger   Length (cm):  2   Depth (mm):  2 Pre-procedure details:    Preparation:  Patient was prepped and  draped in usual sterile fashion and imaging obtained to evaluate for foreign bodies Exploration:    Hemostasis achieved with:  Direct pressure   Imaging obtained: x-ray     Imaging outcome: foreign body not noted     Wound exploration: wound explored through full range of motion   Treatment:    Area cleansed with:  Soap and water and chlorhexidine   Amount of cleaning:  Extensive   Irrigation solution:  Tap water   Irrigation volume:  100   Irrigation method:  Tap   Debridement:  None   Undermining:  None   Scar revision: no   Skin repair:    Repair method:  Sutures   Suture size:  3-0   Suture material:  Nylon   Suture technique:  Simple interrupted   Number of sutures:  5 Approximation:    Approximation:  Loose Repair type:    Repair type:  Simple Post-procedure details:    Dressing:  Antibiotic ointment and non-adherent dressing   Procedure completion:  Tolerated well, no immediate complications     Medications Ordered in ED Medications  lidocaine-EPINEPHrine (XYLOCAINE W/EPI) 2 %-1:200000 (PF) injection 20 mL (20 mLs Other Given 06/06/22 2229)  doxycycline (VIBRA-TABS) tablet 100 mg (100 mg Oral Given 06/06/22 2229)    ED Course/ Medical Decision Making/ A&P                           Medical Decision Making Risk Prescription drug management.     CC: finger injury  This patient presents to the Emergency Department for the above complaint. This involves an extensive number of treatment options and is a complaint that carries with it a high risk of complications and morbidity. Vital signs were reviewed. Serious etiologies considered.  Record review:  Previous records obtained and reviewed urgent care note from earlier today, prior labs and imaging, home medications, allergies  Additional history obtained from spouse  Medical and surgical history as noted above.   Work up as above, notable for:  Labs & imaging results that were available during my care of the  patient were visualized by me and considered in my medical decision making.  Physical exam as above.    imaging studies ordered in triage which included hand x-ray. I visualized the imaging, interpreted images, and I agree with radiologist interpretation.  No fracture  Management: Wound care   Patient injured his finger last night around 8 PM.  It has been approximate 24 hours since the initial injury.  We will loosely approximate the wound, start patient on oral antibiotics.  Follow-up PCP.  ED Course:     Reassessment:  Patient feeling better  Admission was considered.   Patient has an allergy to cephalosporin.  Will give Doxy.  Patient presents with laceration to their fingertip. Please see laceration repair procedure note for further details. Exam benign, neurovascularly intact with full range of motion in surrounding joints. Discussed suture care and removal with pt/family; notified to f/u with PCP to have sutures removed in 7-10 days; reviewed worsening s/s infection with verbalized understanding with pt/family.    Patient in no distress and overall condition improved here in the ED. Detailed discussions were had with the patient regarding current findings, and need for close f/u with PCP or on call doctor. The patient has been instructed to return immediately if the symptoms worsen in any way for re-evaluation. Patient verbalized understanding and is in agreement with current care plan. All questions answered prior to discharge.              Social determinants of health include -  Social History   Socioeconomic History   Marital status: Married    Spouse name: Toby   Number of children: 1   Years of education: Not on file   Highest education level: Not on file  Occupational History   Occupation: retired  Tobacco Use   Smoking status: Former    Packs/day: 1.00    Years: 4.00    Total pack years: 4.00    Types: Cigarettes    Quit date: 12/09/1979    Years  since quitting: 42.5   Smokeless tobacco: Never   Tobacco comments:    Quit 38 years ago  Vaping Use   Vaping Use: Some days  Substance and Sexual Activity   Alcohol use: Yes    Alcohol/week: 14.0 standard drinks of alcohol    Types: 14 Cans of beer per week   Drug use: No   Sexual activity: Yes  Other Topics Concern   Not on file  Social History Narrative   Not on file   Social Determinants of Health   Financial Resource Strain: Low Risk  (01/20/2022)   Overall Financial Resource Strain (CARDIA)    Difficulty of Paying Living Expenses: Not hard at all  Food Insecurity: No Food Insecurity (01/20/2022)   Hunger Vital Sign    Worried About Running Out of Food in the Last Year: Never true    Ran Out of Food in the Last Year: Never true  Transportation Needs: No Transportation Needs (01/20/2022)   PRAPARE - Hydrologist (Medical): No    Lack of Transportation (Non-Medical): No  Physical Activity: Sufficiently Active (01/20/2022)   Exercise Vital Sign  Days of Exercise per Week: 4 days    Minutes of Exercise per Session: 60 min  Stress: No Stress Concern Present (01/20/2022)   [place]    Feeling of Stress : Not at all  Social Connections: [place] (01/20/2022)   Social Connection and Isolation Panel [NHANES]    Frequency of Communication with Friends and Family: More than three times a week    Frequency of Social Gatherings with Friends and Family: More than three times a week    Attends Religious Services: 1 to 4 times per year    Active Member of [employer] or Organizations: Yes    Attends Archivist Meetings: 1 to 4 times per year    Marital Status: Married  Human resources officer Violence: Not At Risk (01/20/2022)   Humiliation, Afraid, Rape, and Kick questionnaire    Fear of Current or Ex-Partner: No    Emotionally Abused: No    Physically Abused: No    Sexually  Abused: No      This chart was dictated using voice recognition software.  Despite best efforts to proofread,  errors can occur which can change the documentation meaning.         Final Clinical Impression(s) / ED Diagnoses Final diagnoses:  Laceration of left ring finger without foreign body without damage to nail, initial encounter    Rx / DC Orders ED Discharge Orders          Ordered    doxycycline (VIBRAMYCIN) 100 MG capsule  2 times daily        06/06/22 2224              Jeanell Sparrow, DO 06/06/22 2342

## 2022-06-06 NOTE — Discharge Instructions (Addendum)
It was a pleasure caring for you today in the emergency department.  Please return to the emergency department for any worsening or worrisome symptoms.   Keep the wound clean and as dry as possible. Do not immerse or soak the wound in water. This means no swimming, washing dishes (unless thick rubber gloves are used), baths, or hot tubs until the stitches are removed or after about two weeks if absorbable suture material was used. Leave original bandages on the wound for the first 24 hours. After this time, showering or rinsing is recommended, rather than bathing. the first day, remove old bandages and gently cleanse the wound with soap and water. Cleansing twice a day prevents buildup of debris and will result in easier suture removal. Have the wound reevaluated for potential suture removal on the Upper extremity in 7-10 days

## 2022-06-06 NOTE — ED Notes (Signed)
Lt ring finger wrapped with non-adhesive bandage and coban at this time per provider

## 2022-06-06 NOTE — ED Provider Notes (Signed)
[facility]    CSN: [id number] Arrival date & time: 06/06/22  1300      History   Chief Complaint Chief Complaint  Patient presents with   Laceration    Left 4th fingertip     HPI Shaun Armstrong is a 71 y.o. male.   HPI 71 year old man presents with laceration of left fourth finger secondary to injury last night, ~8 pm while moving rocks.  Patient reports soaked in Hibiclens and received tetanus 1 year ago.  Patient is accompanied by his wife this afternoon.  PMH significant for bilateral pulmonary embolism, DVT, essential tremor, and hyperparathyroidism. Patient is currently on Rivaroxaban.  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   Crohn's disease ([place])    Deafness in right ear, wears a microphone    Decreased peripheral vision 11/04/2011   Essential tremor    HLD (hyperlipidemia)    Inguinal hernia 07/2016   Retinal detachment    Wears hearing aid, left ear     Patient Active Problem List   Diagnosis Date Noted   S/P parathyroidectomy ([place]) 05/07/2022   Bilateral pulmonary embolism ([place]) 10/05/2020   Acute deep vein thrombosis (DVT) of proximal vein of right lower extremity ([place]) 10/05/2020   Hyperparathyroidism, primary ([place]) 06/07/2020   Hypercalcemia 06/07/2020   Meralgia paresthetica of right side 10/25/2018   Gastroesophageal reflux disease without esophagitis 10/25/2018   Sun-damaged skin 08/03/2017   Essential tremor 06/05/2016   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   Crohn's disease of both small and large intestine without complication ([place]) 77/41/2878   Hyperlipidemia 10/05/2012   Decreased peripheral vision 11/04/2011    Past Surgical History:  Procedure Laterality Date   ACOUSTIC NEUROMA RESECTION Right    BROW LIFT AND BLEPHAROPLASTY Bilateral 10/11/2012   CATARACT EXTRACTION W/ INTRAOCULAR LENS  IMPLANT, BILATERAL Bilateral    HERNIA REPAIR     INGUINAL HERNIA REPAIR Right 07/16/2016   Procedure: LAPAROSCOPIC  RIGHT INGUINAL HERNIA;  Surgeon: Coralie Keens, MD;  Location: [facility];  Service: General;  Laterality: Right;   INSERTION OF MESH Right 07/16/2016   Procedure: INSERTION OF MESH;  Surgeon: Coralie Keens, MD;  Location: [facility];  Service: General;  Laterality: Right;   MELANOMA EXCISION  01/2022   scalp   MOHS SURGERY N/A 11/05/2021   scalp   PARATHYROIDECTOMY Left 05/07/2022   Procedure: MINIMALLY INVASIVE LEFT INFERIOR PARATHYROIDECTOMY;  Surgeon: Ralene Ok, MD;  Location: [facility];  Service: General;  Laterality: Left;   RETINAL DETACHMENT SURGERY Left    TONSILLECTOMY         Home Medications    Prior to Admission medications   Medication Sig Start Date End Date Taking? Authorizing Provider  calcium carbonate (TUMS - DOSED IN MG ELEMENTAL CALCIUM) 500 MG chewable tablet Chew 1 tablet by mouth at bedtime as needed for indigestion or heartburn.    [provider]  calcium carbonate (TUMS) 500 MG chewable tablet Chew 1 tablet (200 mg of elemental calcium total) by mouth 3 (three) times daily. 05/08/22 05/08/23  Ralene Ok, MD  Cholecalciferol (VITAMIN D) 125 MCG (5000 UT) CAPS Take 1 capsule by mouth once a week.    [provider]  glucosamine-chondroitin 500-400 MG tablet Take 1 tablet by mouth daily. With msm    [provider]  mesalamine (LIALDA) 1.2 g EC tablet Take 2 tablets by mouth once a day 10/02/21     Multiple Vitamin (MULTIVITAMIN) tablet  Take 1 tablet by mouth daily.    [provider]  Omega-3 Fatty Acids (FISH OIL) 1000 MG CAPS Take 2,000 mg by mouth daily.    [provider]  Polyethyl Glycol-Propyl Glycol (SYSTANE FREE OP) Place 2 drops into both eyes 5 (five) times daily as needed (dry eyes).    [provider]  propranolol (INDERAL) 20 MG tablet TAKE TWO TABLETS BY MOUTH DAILY 01/27/22 01/27/23  Vivi Barrack, MD  rivaroxaban (XARELTO) 20 MG TABS tablet TAKE 1  TABLET BY MOUTH DAILY WITH SUPPER 06/06/22 06/06/23  Vivi Barrack, MD  rosuvastatin (CRESTOR) 20 MG tablet TAKE 1 TABLET BY MOUTH ONCE DAILY 06/06/22 06/06/23  Vivi Barrack, MD    Family History Family History  Problem Relation Age of Onset   Diabetes Mother    Dementia Mother    Diabetes Sister    Hyperparathyroidism Neg Hx     Social History Social History   Tobacco Use   Smoking status: Former    Packs/day: 1.00    Years: 4.00    Total pack years: 4.00    Types: Cigarettes    Quit date: 12/09/1979    Years since quitting: 42.5   Smokeless tobacco: Never   Tobacco comments:    Quit 38 years ago  Vaping Use   Vaping Use: Some days  Substance Use Topics   Alcohol use: Yes    Alcohol/week: 14.0 standard drinks of alcohol    Types: 14 Cans of beer per week   Drug use: No     Allergies   Cefuroxime and Poison ivy extract   Review of Systems Review of Systems  Skin:  Positive for wound.     Physical Exam Triage Vital Signs ED Triage Vitals [06/06/22 1334]  Enc Vitals Group     BP (!) 173/79     Pulse Rate (!) 51     Resp 16     Temp 98 F (36.7 C)     Temp Source Oral     SpO2 98 %     Weight      Height      Head Circumference      Peak Flow      Pain Score 2     Pain Loc      Pain Edu?      Excl. in [place]?    No data found.  Updated Vital Signs BP (!) 173/79 (BP Location: Right Arm)   Pulse (!) 51   Temp 98 F (36.7 C) (Oral)   Resp 16   SpO2 98%      Physical Exam Nursing note reviewed.  Constitutional:      Appearance: Normal appearance. He is normal weight.  HENT:     Head: Normocephalic and atraumatic.     Mouth/Throat:     Mouth: Mucous membranes are moist.     Pharynx: Oropharynx is clear.  Eyes:     Extraocular Movements: Extraocular movements intact.     Conjunctiva/sclera: Conjunctivae normal.     Pupils: Pupils are equal, round, and reactive to light.  Cardiovascular:     Rate and Rhythm: Normal rate and regular rhythm.      Pulses: Normal pulses.     Heart sounds: Normal heart sounds.  Pulmonary:     Effort: Pulmonary effort is normal.     Breath sounds: Normal breath sounds. No wheezing, rhonchi or rales.  Musculoskeletal:     Cervical back: Normal range of motion  and neck supple.  Skin:    General: Skin is warm and dry.     Comments: Left hand ring finger (DIP): please see image inserted below  Neurological:     General: No focal deficit present.     Mental Status: He is alert and oriented to person, place, and time.       UC Treatments / Results  Labs (all labs ordered are listed, but only abnormal results are displayed) Labs Reviewed - No data to display  EKG   Radiology No results found.  Procedures Procedures (including critical care time)  Medications Ordered in UC Medications - No data to display  Initial Impression / Assessment and Plan / UC Course  I have reviewed the triage vital signs and the nursing notes.  Pertinent labs & imaging results that were available during my care of the patient were reviewed by me and considered in my medical decision making (see chart for details).     MDM: 1.  Laceration of left ring finger without foreign body without damage to nail, initial encounter-Advised patient to go to [facility] ED now for further evaluation/repair/wound care evaluation.  Advised patient RN staff has called ahead to this facility to make them aware of your pending arrival.  Patient/wife agreed and verbalized understanding of these instructions and this plan of care this afternoon. Final Clinical Impressions(s) / UC Diagnoses   Final diagnoses:  Laceration of left ring finger without foreign body without damage to nail, initial encounter     Discharge Instructions      Advised patient to go to [facility] ED now for further evaluation/repair/wound care evaluation.  Advised patient RN staff has called ahead to this facility to make them aware of your pending  arrival.     ED Prescriptions   None    PDMP not reviewed this encounter.   Eliezer Lofts, [place] 06/06/22 1513

## 2022-06-06 NOTE — ED Notes (Signed)
Injury to lt hand ring finger. Currently wrapped with a bandage at this time.

## 2022-06-06 NOTE — ED Triage Notes (Signed)
Pt arrives POV referral from urgent care for L ring finger injury. Pt reports he was stacking rocks last night and crushed finger in between two rocks. Dsg applied in triage. L finger w/ large open laceration. TDAP UTD

## 2022-06-06 NOTE — ED Provider Triage Note (Signed)
Emergency Medicine Provider Triage Evaluation Note  Shaun Armstrong , a 71 y.o. male  was evaluated in triage.  Pt complains of injury to the left ring finger.  It was crushed by a quarts rock.  I note a large laceration to lateral aspect of distal left ring finger without nail involvement.  There is no active bleeding through the bandage at time my evaluation.  Patient reports that he has no numbness, tingling of the affected extremity, very tender to palpation but 1 out of 10 pain at rest.  Review of Systems  Positive: Wound Negative:   Physical Exam  BP (!) 155/75 (BP Location: Right Arm)   Pulse (!) 47   Temp 98.1 F (36.7 C) (Oral)   Resp 18   Ht 5' 9"  (1.753 m)   Wt 91 kg   SpO2 100%   BMI 29.63 kg/m  Gen:   Awake, no distress   Resp:  Normal effort  MSK:   Moves extremities without difficulty  Other:  Laceration distal left ring finger as noted above, no other significant injuries.  No nailbed involvement.  Medical Decision Making  Medically screening exam initiated at 3:24 PM.  Appropriate orders placed.  FAIZON CAPOZZI was informed that the remainder of the evaluation will be completed by another provider, this initial triage assessment does not replace that evaluation, and the importance of remaining in the ED until their evaluation is complete.  Workup initiated   Anselmo Pickler, [place] 06/06/22 1526

## 2022-06-06 NOTE — ED Triage Notes (Addendum)
Patient reports while moving a large rock last night about 8pm his left 4th fingertip was caught between 2 rocks causing a laceration. Reports last tetanus shot was about 1 year ago. Soaked with Hibiclens.

## 2022-06-06 NOTE — ED Notes (Signed)
Provider at bedside

## 2022-06-06 NOTE — Discharge Instructions (Addendum)
Advised patient to go to [facility] ED now for further evaluation/repair/wound care evaluation.  Advised patient RN staff has called ahead to this facility to make them aware of your pending arrival.

## 2022-06-09 ENCOUNTER — Telehealth: Payer: Self-pay | Admitting: Family Medicine

## 2022-06-09 NOTE — Telephone Encounter (Signed)
Pt was not willing to see another provider. He will be schedule for 06/12/22 at 2:40 pm  Client [facility] Client [facility] Night Provider Dimas Chyle- MD Contact Type Call Who Is Calling Patient / Member / Family / Caregiver Caller Name [place] Phone Number [phone] Patient Name Subhan Hoopes Patient DOB 1951/07/23 Call Type Message Only Information Provided Reason for Call Request to Schedule Office Appointment Initial Comment Caller States he had an laceration in his ring finger. Caller States he had it seen in urgent care and was told he needed to be seen by his doctor within a couple days so he needs to make an appointment. Patient request to speak to RN No Additional Comment Office hours provided. Declined triage. Disp. Time Disposition Final User 06/08/2022 10:29:01 AM General Information Provided Yes [place], [place] Call Closed By: Maryelizabeth Kaufmann Transaction Date/Time: 06/08/2022 10:24:16 AM (ET)

## 2022-06-09 NOTE — Telephone Encounter (Signed)
See note

## 2022-06-12 ENCOUNTER — Ambulatory Visit (INDEPENDENT_AMBULATORY_CARE_PROVIDER_SITE_OTHER): Payer: PPO | Admitting: Family Medicine

## 2022-06-12 ENCOUNTER — Encounter: Payer: Self-pay | Admitting: Family Medicine

## 2022-06-12 VITALS — BP 120/78 | HR 52 | Temp 97.2°F | Ht 69.0 in | Wt 198.2 lb

## 2022-06-12 DIAGNOSIS — M79645 Pain in left finger(s): Secondary | ICD-10-CM | POA: Diagnosis not present

## 2022-06-12 DIAGNOSIS — L578 Other skin changes due to chronic exposure to nonionizing radiation: Secondary | ICD-10-CM | POA: Diagnosis not present

## 2022-06-12 DIAGNOSIS — E210 Primary hyperparathyroidism: Secondary | ICD-10-CM | POA: Diagnosis not present

## 2022-06-12 NOTE — Patient Instructions (Signed)
Laceration Care, Adult A laceration is a cut that may go through all layers of the skin. The cut may also go into the tissue that is right under the skin. Some cuts heal on their own. Other cuts need to be closed with stitches (sutures), staples, skin adhesive strips, or skin glue. Taking care of your cut lowers your risk of infection, helps your injury heal better, and may prevent scarring. General tips Keep your wound clean and dry. Do not scratch or pick at your wound. Wash your hands with soap and water for at least 20 seconds before and after touching your wound or changing your bandage (dressing). If you cannot use soap and water, use hand sanitizer. Do not usedisinfectants or antiseptics, such as rubbing alcohol, to clean your wound unless told by your doctor. If you were given a bandage, change it at least once a day, or as told by your doctor. You should also change it if it gets wet or dirty. How to take care of your cut If your doctor used stitches or staples: Keep the wound fully dry for the first 24 hours, or as told by your doctor. After that, you may take a shower or a bath. Do not soak the wound in water until after the stitches or staples have been taken out. Clean the wound once a day, or as told by your doctor. To do this: Wash the wound with soap and water. Rinse the wound with water to remove all soap. Pat the wound dry with a clean towel. Do not rub the wound. After you clean the wound, put a thin layer of antibiotic ointment, another ointment, or a nonstick bandage on it as told by your doctor. This will help to: Prevent infection. Keep the bandage from sticking to the wound. Have your stitches or staples taken out as told by your doctor. If your doctor used skin adhesive strips: Do not get the skin adhesive strips wet. You can take a shower or a bath, but keep the wound dry. If the wound gets wet, pat it dry with a clean towel. Do not rub the wound. Skin adhesive strips  fall off on their own. You can trim the strips as the wound heals. Do not take off any strips that are still stuck to the wound unless told by your doctor. The strips will fall off after a while. If your doctor used skin glue: You may take a shower or a bath, but try to keep the wound dry. Do not soak the wound in water. After you take a shower or a bath, pat the wound dry with a clean towel. Do not rub the wound. Do not do any activities that will make you sweat a lot until the skin glue has fallen off. Do not apply liquid, cream, or ointment medicine to your wound while the skin glue is still on. If a bandage is placed over the wound, do not put tape right on top of the skin glue. Do not pick at the glue. The skin glue usually stays on for 5-10 days. Then, it falls off the skin. Follow these instructions at home: Medicines Take over-the-counter and prescription medicines only as told by your doctor. If you were prescribed an antibiotic medicine, take or apply it as told by your doctor. Do not stop using it even if you start to feel better. Managing pain and swelling If told, put ice on the injured area. To do this: Put ice in a  plastic bag. Place a towel between your skin and the bag. Leave the ice on for 20 minutes, 2-3 times a day. Take off the ice if your skin turns bright red. This is very important. If you cannot feel pain, heat, or cold, you have a greater risk of damage to the area. Raise the injured area above the level of your heart while you are sitting or lying down. General instructions  Avoid any activity that could make your wound reopen. Check your wound every day for signs of infection. Check for: More redness, swelling, or pain. Fluid or blood. Warmth. Pus or a bad smell. Keep all follow-up visits. Contact a doctor if: You got a tetanus shot and you have any of these problems where the needle went in: Swelling. Very bad pain. Redness. Bleeding. A wound that was  closed breaks open. You have a fever. You have any of these signs of infection in your wound: More redness, swelling, or pain. Fluid or blood. Warmth. Pus or a bad smell. You see something coming out of the wound, such as wood or glass. Medicine does not make your pain go away. You notice a change in the color of your skin near your wound. You need to change the bandage often. You have a new rash. You lose feeling (have numbness) around the wound. Get help right away if: You have very bad swelling around the wound. Your pain suddenly gets worse and is very bad. You have painful lumps near the wound or on skin anywhere on your body. You have a red streak going away from your wound. The wound is on your hand or foot, and: You cannot move a finger or toe. Your fingers or toes look pale or bluish. Summary A laceration is a cut that may go through all layers of the skin. The cut may also go into the tissue right under the skin. Some cuts heal on their own. Others need to be closed with stitches, staples, skin adhesive strips, or skin glue. Follow your doctor's instructions for caring for your cut. Proper care of a cut lowers the risk of infection, helps the cut heal better, and may prevent scarring. This information is not intended to replace advice given to you by your health care provider. Make sure you discuss any questions you have with your health care provider. Document Revised: 01/31/2021 Document Reviewed: 01/31/2021 Elsevier Patient Education  [place].

## 2022-06-12 NOTE — Assessment & Plan Note (Signed)
Status post parathyroidectomy.  Doing well postoperatively.

## 2022-06-12 NOTE — Assessment & Plan Note (Signed)
Follows with dermatology.  Currently using fluorouracil which is causing some irritation on his face however this is manageable.

## 2022-06-12 NOTE — Progress Notes (Signed)
   Shaun Armstrong is a 71 y.o. male who presents today for an office visit.  Assessment/Plan:  New/Acute Problems: Finger Armstrong Sutures removed today.  No signs of infection.  Neurovascular intact distally.  Steri-Strips were placed.  He still has an open area that will need to heal via secondary intention.  We discussed wound care.  We discussed reasons to return to care.  He can follow-up as needed.  Chronic Problems Addressed Today: Hyperparathyroidism, primary ([place]) Status post parathyroidectomy.  Doing well postoperatively.  Sun-damaged skin Follows with dermatology.  Currently using fluorouracil which is causing some irritation on his face however this is manageable.     Subjective:  HPI:  Patient here for ED follow up.  Went to the ED a week ago after injuring his finger.  He was moving stones on his house when he crushed his left ring finger.  X-rays did not show any fracture.  He was started on doxycycline.  He had 5 sutures placed.  He still has some tenderness to the area but is otherwise doing well.  Occasional drainage.       Objective:  Physical Exam: BP 120/78   Pulse (!) 52   Temp (!) 97.2 F (36.2 C) (Temporal)   Ht 5' 9"  (1.753 m)   Wt 198 lb 3.2 oz (89.9 kg)   SpO2 98%   BMI 29.27 kg/m   Gen: No acute distress, resting comfortably CV: Regular rate and rhythm with no murmurs appreciated Pulm: Normal work of breathing, clear to auscultation bilaterally with no crackles, wheezes, or rhonchi MSK: Left fourth digit with U-shaped laceration on distal phalanx.  Small amount of surrounding edema.  No erythema.  No purulent drainage.  5 simple interrupted sutures in place. Neuro: Grossly normal, moves all extremities Psych: Normal affect and thought content  Sutures were removed without difficulty.  Steri-Strips were placed.  Time Spent: 40 minutes of total time was spent on the date of the encounter performing the following actions: chart review prior to seeing  the patient including recent ED visit, obtaining history, performing a medically necessary exam, counseling on the treatment plan, placing orders, and documenting in our EHR.        Shaun Armstrong. Shaun Pain, MD 06/12/2022 3:31 PM

## 2022-06-16 ENCOUNTER — Ambulatory Visit: Payer: PPO | Admitting: Endocrinology

## 2022-07-14 ENCOUNTER — Other Ambulatory Visit (HOSPITAL_BASED_OUTPATIENT_CLINIC_OR_DEPARTMENT_OTHER): Payer: Self-pay

## 2022-07-15 ENCOUNTER — Other Ambulatory Visit (HOSPITAL_BASED_OUTPATIENT_CLINIC_OR_DEPARTMENT_OTHER): Payer: Self-pay

## 2022-07-17 ENCOUNTER — Other Ambulatory Visit (HOSPITAL_COMMUNITY): Payer: Self-pay

## 2022-07-17 ENCOUNTER — Other Ambulatory Visit (HOSPITAL_BASED_OUTPATIENT_CLINIC_OR_DEPARTMENT_OTHER): Payer: Self-pay

## 2022-07-17 DIAGNOSIS — K5010 Crohn's disease of large intestine without complications: Secondary | ICD-10-CM | POA: Diagnosis not present

## 2022-07-17 MED ORDER — MESALAMINE 1.2 G PO TBEC
DELAYED_RELEASE_TABLET | ORAL | 5 refills | Status: DC
  Filled 2022-07-17: qty 120, 60d supply, fill #0
  Filled 2022-09-29: qty 120, 60d supply, fill #1
  Filled 2022-11-25: qty 120, 60d supply, fill #2
  Filled 2023-02-23: qty 120, 60d supply, fill #3
  Filled 2023-05-07: qty 120, 60d supply, fill #4
  Filled 2023-07-09 – 2023-07-10 (×2): qty 120, 60d supply, fill #5

## 2022-08-13 DIAGNOSIS — L821 Other seborrheic keratosis: Secondary | ICD-10-CM | POA: Diagnosis not present

## 2022-08-13 DIAGNOSIS — L578 Other skin changes due to chronic exposure to nonionizing radiation: Secondary | ICD-10-CM | POA: Diagnosis not present

## 2022-08-13 DIAGNOSIS — L820 Inflamed seborrheic keratosis: Secondary | ICD-10-CM | POA: Diagnosis not present

## 2022-08-13 DIAGNOSIS — L570 Actinic keratosis: Secondary | ICD-10-CM | POA: Diagnosis not present

## 2022-09-01 ENCOUNTER — Encounter: Payer: Self-pay | Admitting: *Deleted

## 2022-09-29 ENCOUNTER — Other Ambulatory Visit (HOSPITAL_BASED_OUTPATIENT_CLINIC_OR_DEPARTMENT_OTHER): Payer: Self-pay

## 2022-09-29 ENCOUNTER — Other Ambulatory Visit: Payer: Self-pay | Admitting: Family Medicine

## 2022-09-29 DIAGNOSIS — E785 Hyperlipidemia, unspecified: Secondary | ICD-10-CM

## 2022-09-29 MED ORDER — ROSUVASTATIN CALCIUM 20 MG PO TABS
20.0000 mg | ORAL_TABLET | Freq: Every day | ORAL | 0 refills | Status: DC
  Filled 2022-09-29: qty 90, 90d supply, fill #0

## 2022-10-16 DIAGNOSIS — K50119 Crohn's disease of large intestine with unspecified complications: Secondary | ICD-10-CM | POA: Diagnosis not present

## 2022-10-21 ENCOUNTER — Other Ambulatory Visit (HOSPITAL_BASED_OUTPATIENT_CLINIC_OR_DEPARTMENT_OTHER): Payer: Self-pay

## 2022-10-21 MED ORDER — COVID-19 MRNA VAC-TRIS(PFIZER) 30 MCG/0.3ML IM SUSY
0.3000 mL | PREFILLED_SYRINGE | Freq: Once | INTRAMUSCULAR | 0 refills | Status: AC
  Filled 2022-10-21: qty 0.3, 1d supply, fill #0

## 2022-10-21 MED ORDER — FLUAD QUADRIVALENT 0.5 ML IM PRSY
PREFILLED_SYRINGE | INTRAMUSCULAR | 0 refills | Status: DC
  Filled 2022-10-21: qty 0.5, 1d supply, fill #0

## 2022-11-17 ENCOUNTER — Other Ambulatory Visit (HOSPITAL_BASED_OUTPATIENT_CLINIC_OR_DEPARTMENT_OTHER): Payer: Self-pay

## 2022-11-17 MED ORDER — AREXVY 120 MCG/0.5ML IM SUSR
INTRAMUSCULAR | 0 refills | Status: DC
  Filled 2022-11-17: qty 0.5, 1d supply, fill #0

## 2022-11-20 DIAGNOSIS — Z7901 Long term (current) use of anticoagulants: Secondary | ICD-10-CM | POA: Diagnosis not present

## 2022-11-20 DIAGNOSIS — R2689 Other abnormalities of gait and mobility: Secondary | ICD-10-CM | POA: Diagnosis not present

## 2022-11-20 DIAGNOSIS — H6123 Impacted cerumen, bilateral: Secondary | ICD-10-CM | POA: Diagnosis not present

## 2022-11-20 DIAGNOSIS — H903 Sensorineural hearing loss, bilateral: Secondary | ICD-10-CM | POA: Diagnosis not present

## 2022-11-20 DIAGNOSIS — G510 Bell's palsy: Secondary | ICD-10-CM | POA: Diagnosis not present

## 2022-11-20 DIAGNOSIS — D333 Benign neoplasm of cranial nerves: Secondary | ICD-10-CM | POA: Diagnosis not present

## 2022-11-20 DIAGNOSIS — Z87891 Personal history of nicotine dependence: Secondary | ICD-10-CM | POA: Diagnosis not present

## 2022-11-20 DIAGNOSIS — Z974 Presence of external hearing-aid: Secondary | ICD-10-CM | POA: Diagnosis not present

## 2022-11-20 DIAGNOSIS — Z862 Personal history of diseases of the blood and blood-forming organs and certain disorders involving the immune mechanism: Secondary | ICD-10-CM | POA: Diagnosis not present

## 2022-11-20 DIAGNOSIS — I82401 Acute embolism and thrombosis of unspecified deep veins of right lower extremity: Secondary | ICD-10-CM | POA: Diagnosis not present

## 2022-11-20 DIAGNOSIS — H9319 Tinnitus, unspecified ear: Secondary | ICD-10-CM | POA: Diagnosis not present

## 2022-11-20 DIAGNOSIS — Z85820 Personal history of malignant melanoma of skin: Secondary | ICD-10-CM | POA: Diagnosis not present

## 2022-11-20 DIAGNOSIS — H7201 Central perforation of tympanic membrane, right ear: Secondary | ICD-10-CM | POA: Diagnosis not present

## 2022-11-25 ENCOUNTER — Other Ambulatory Visit: Payer: Self-pay | Admitting: Family Medicine

## 2022-11-25 ENCOUNTER — Other Ambulatory Visit (HOSPITAL_COMMUNITY): Payer: Self-pay

## 2022-11-25 ENCOUNTER — Other Ambulatory Visit (HOSPITAL_BASED_OUTPATIENT_CLINIC_OR_DEPARTMENT_OTHER): Payer: Self-pay

## 2022-11-26 ENCOUNTER — Other Ambulatory Visit: Payer: Self-pay

## 2022-11-26 ENCOUNTER — Other Ambulatory Visit (HOSPITAL_BASED_OUTPATIENT_CLINIC_OR_DEPARTMENT_OTHER): Payer: Self-pay

## 2022-11-26 MED ORDER — RIVAROXABAN 20 MG PO TABS
20.0000 mg | ORAL_TABLET | Freq: Every day | ORAL | 2 refills | Status: DC
  Filled 2022-11-26 – 2022-11-27 (×2): qty 30, 30d supply, fill #0
  Filled 2023-01-15: qty 30, 30d supply, fill #1
  Filled 2023-02-23: qty 30, 30d supply, fill #2

## 2022-11-27 ENCOUNTER — Other Ambulatory Visit (HOSPITAL_BASED_OUTPATIENT_CLINIC_OR_DEPARTMENT_OTHER): Payer: Self-pay

## 2022-11-27 ENCOUNTER — Other Ambulatory Visit (HOSPITAL_COMMUNITY): Payer: Self-pay

## 2022-11-27 ENCOUNTER — Other Ambulatory Visit: Payer: Self-pay

## 2023-01-15 ENCOUNTER — Other Ambulatory Visit (HOSPITAL_COMMUNITY): Payer: Self-pay

## 2023-01-15 ENCOUNTER — Other Ambulatory Visit: Payer: Self-pay

## 2023-01-15 ENCOUNTER — Other Ambulatory Visit: Payer: Self-pay | Admitting: Family Medicine

## 2023-01-15 DIAGNOSIS — E785 Hyperlipidemia, unspecified: Secondary | ICD-10-CM

## 2023-01-15 MED ORDER — ROSUVASTATIN CALCIUM 20 MG PO TABS
20.0000 mg | ORAL_TABLET | Freq: Every day | ORAL | 0 refills | Status: DC
  Filled 2023-01-15: qty 90, 90d supply, fill #0

## 2023-01-16 ENCOUNTER — Other Ambulatory Visit: Payer: Self-pay

## 2023-02-02 ENCOUNTER — Ambulatory Visit (INDEPENDENT_AMBULATORY_CARE_PROVIDER_SITE_OTHER): Payer: PPO

## 2023-02-02 VITALS — BP 138/78 | HR 64 | Temp 97.5°F | Wt 205.6 lb

## 2023-02-02 DIAGNOSIS — Z0000 Encounter for general adult medical examination without abnormal findings: Secondary | ICD-10-CM

## 2023-02-02 NOTE — Patient Instructions (Signed)
Mr. Shaun Armstrong , Thank you for taking time to come for your Medicare Wellness Visit. I appreciate your ongoing commitment to your health goals. Please review the following plan we discussed and let me know if I can assist you in the future.   These are the goals we discussed:  Goals      Patient Stated     Lose 15- 20 lbs before summer     Patient Stated     Lose weight      Weight (lb) < 185 lb (83.9 kg)     Will cut back on sugar  Check out  online nutrition programs as GumSearch.nl and http://vang.com/; fit62m; Look for foods with "whole" wheat; bran; oatmeal etc Shot at the farmer's markets in season for fresher choices  Watch for "hydrogenated" on the label of oils which are trans-fats.  Watch for "high fructose corn syrup" in snacks, yogurt or ketchup  Meats have less marbling; bright colored fruits and vegetables;  Canned; dump out liquid and wash vegetables. Be mindful of what we are eating  Portion control is essential to a health weight! Sit down; take a break and enjoy your meal; take smaller bites; put the fork down between bites;  It takes 20 minutes to get full; so check in with your fullness cues and stop eating when you start to fill full               This is a list of the screening recommended for you and due dates:  Health Maintenance  Topic Date Due   Zoster (Shingles) Vaccine (1 of 2) Never done   Pneumonia Vaccine (1 of 1 - PCV) Never done   Medicare Annual Wellness Visit  02/03/2024   Colon Cancer Screening  06/18/2024   DTaP/Tdap/Td vaccine (4 - Td or Tdap) 04/02/2031   Flu Shot  Completed   COVID-19 Vaccine  Completed   Hepatitis C Screening: USPSTF Recommendation to screen - Ages 18-79 yo.  Completed   HPV Vaccine  Aged Out    Advanced directives: Please bring a copy of your health care power of attorney and living will to the office at your convenience.  Conditions/risks identified: lose some weight   Next appointment: Follow up in one  year for your annual wellness visit.   Preventive Care 640Years and Older, Male  Preventive care refers to lifestyle choices and visits with your health care provider that can promote health and wellness. What does preventive care include? A yearly physical exam. This is also called an annual well check. Dental exams once or twice a year. Routine eye exams. Ask your health care provider how often you should have your eyes checked. Personal lifestyle choices, including: Daily care of your teeth and gums. Regular physical activity. Eating a healthy diet. Avoiding tobacco and drug use. Limiting alcohol use. Practicing safe sex. Taking low doses of aspirin every day. Taking vitamin and mineral supplements as recommended by your health care provider. What happens during an annual well check? The services and screenings done by your health care provider during your annual well check will depend on your age, overall health, lifestyle risk factors, and family history of disease. Counseling  Your health care provider may ask you questions about your: Alcohol use. Tobacco use. Drug use. Emotional well-being. Home and relationship well-being. Sexual activity. Eating habits. History of falls. Memory and ability to understand (cognition). Work and work eStatistician Screening  You may have the following tests or measurements: Height,  weight, and BMI. Blood pressure. Lipid and cholesterol levels. These may be checked every 5 years, or more frequently if you are over 40 years old. Skin check. Lung cancer screening. You may have this screening every year starting at age 39 if you have a 30-pack-year history of smoking and currently smoke or have quit within the past 15 years. Fecal occult blood test (FOBT) of the stool. You may have this test every year starting at age 31. Flexible sigmoidoscopy or colonoscopy. You may have a sigmoidoscopy every 5 years or a colonoscopy every 10 years starting  at age 86. Prostate cancer screening. Recommendations will vary depending on your family history and other risks. Hepatitis C blood test. Hepatitis B blood test. Sexually transmitted disease (STD) testing. Diabetes screening. This is done by checking your blood sugar (glucose) after you have not eaten for a while (fasting). You may have this done every 1-3 years. Abdominal aortic aneurysm (AAA) screening. You may need this if you are a current or former smoker. Osteoporosis. You may be screened starting at age 20 if you are at high risk. Talk with your health care provider about your test results, treatment options, and if necessary, the need for more tests. Vaccines  Your health care provider may recommend certain vaccines, such as: Influenza vaccine. This is recommended every year. Tetanus, diphtheria, and acellular pertussis (Tdap, Td) vaccine. You may need a Td booster every 10 years. Zoster vaccine. You may need this after age 62. Pneumococcal 13-valent conjugate (PCV13) vaccine. One dose is recommended after age 77. Pneumococcal polysaccharide (PPSV23) vaccine. One dose is recommended after age 77. Talk to your health care provider about which screenings and vaccines you need and how often you need them. This information is not intended to replace advice given to you by your health care provider. Make sure you discuss any questions you have with your health care provider. Document Released: 12/21/2015 Document Revised: 08/13/2016 Document Reviewed: 09/25/2015 Elsevier Interactive Patient Education  2017 Protection Prevention in the Home Falls can cause injuries. They can happen to people of all ages. There are many things you can do to make your home safe and to help prevent falls. What can I do on the outside of my home? Regularly fix the edges of walkways and driveways and fix any cracks. Remove anything that might make you trip as you walk through a door, such as a raised  step or threshold. Trim any bushes or trees on the path to your home. Use bright outdoor lighting. Clear any walking paths of anything that might make someone trip, such as rocks or tools. Regularly check to see if handrails are loose or broken. Make sure that both sides of any steps have handrails. Any raised decks and porches should have guardrails on the edges. Have any leaves, snow, or ice cleared regularly. Use sand or salt on walking paths during winter. Clean up any spills in your garage right away. This includes oil or grease spills. What can I do in the bathroom? Use night lights. Install grab bars by the toilet and in the tub and shower. Do not use towel bars as grab bars. Use non-skid mats or decals in the tub or shower. If you need to sit down in the shower, use a plastic, non-slip stool. Keep the floor dry. Clean up any water that spills on the floor as soon as it happens. Remove soap buildup in the tub or shower regularly. Attach bath mats securely  with double-sided non-slip rug tape. Do not have throw rugs and other things on the floor that can make you trip. What can I do in the bedroom? Use night lights. Make sure that you have a light by your bed that is easy to reach. Do not use any sheets or blankets that are too big for your bed. They should not hang down onto the floor. Have a firm chair that has side arms. You can use this for support while you get dressed. Do not have throw rugs and other things on the floor that can make you trip. What can I do in the kitchen? Clean up any spills right away. Avoid walking on wet floors. Keep items that you use a lot in easy-to-reach places. If you need to reach something above you, use a strong step stool that has a grab bar. Keep electrical cords out of the way. Do not use floor polish or wax that makes floors slippery. If you must use wax, use non-skid floor wax. Do not have throw rugs and other things on the floor that can  make you trip. What can I do with my stairs? Do not leave any items on the stairs. Make sure that there are handrails on both sides of the stairs and use them. Fix handrails that are broken or loose. Make sure that handrails are as long as the stairways. Check any carpeting to make sure that it is firmly attached to the stairs. Fix any carpet that is loose or worn. Avoid having throw rugs at the top or bottom of the stairs. If you do have throw rugs, attach them to the floor with carpet tape. Make sure that you have a light switch at the top of the stairs and the bottom of the stairs. If you do not have them, ask someone to add them for you. What else can I do to help prevent falls? Wear shoes that: Do not have high heels. Have rubber bottoms. Are comfortable and fit you well. Are closed at the toe. Do not wear sandals. If you use a stepladder: Make sure that it is fully opened. Do not climb a closed stepladder. Make sure that both sides of the stepladder are locked into place. Ask someone to hold it for you, if possible. Clearly mark and make sure that you can see: Any grab bars or handrails. First and last steps. Where the edge of each step is. Use tools that help you move around (mobility aids) if they are needed. These include: Canes. Walkers. Scooters. Crutches. Turn on the lights when you go into a dark area. Replace any light bulbs as soon as they burn out. Set up your furniture so you have a clear path. Avoid moving your furniture around. If any of your floors are uneven, fix them. If there are any pets around you, be aware of where they are. Review your medicines with your doctor. Some medicines can make you feel dizzy. This can increase your chance of falling. Ask your doctor what other things that you can do to help prevent falls. This information is not intended to replace advice given to you by your health care provider. Make sure you discuss any questions you have with  your health care provider. Document Released: 09/20/2009 Document Revised: 05/01/2016 Document Reviewed: 12/29/2014 Elsevier Interactive Patient Education  2017 [employer].

## 2023-02-02 NOTE — Progress Notes (Addendum)
Subjective:   Shaun Armstrong is a 72 y.o. male who presents for Medicare Annual/Subsequent preventive examination.  Review of Systems     Cardiac Risk Factors include: advanced age (>72mn, >>61women);dyslipidemia;male gender;obesity (BMI >30kg/m2)     Objective:    Today's Vitals   02/02/23 0833  BP: 138/78  Pulse: 64  Temp: (!) 97.5 F (36.4 C)  SpO2: 94%  Weight: 205 lb 9.6 oz (93.3 kg)   Body mass index is 30.36 kg/m.     02/02/2023    8:51 AM 04/29/2022   10:10 AM 01/20/2022    8:37 AM 01/14/2021    9:01 AM 10/04/2020    8:07 PM 04/29/2018    7:21 PM 07/21/2017    8:28 AM  Advanced Directives  Does Patient Have a Medical Advance Directive? Yes Yes Yes Yes Yes No Yes  Type of AParamedicof [place]ri[place]Living will Healthcare Power of AVolgaof ANorth Belle VernonLiving will HMountain ViewLiving will  HWyl[place]Living will  Does patient want to make changes to medical advance directive?  No - Patient declined     No - Patient declined  Copy of [place]in Chart? No - copy requested No - copy requested No - copy requested No - copy requested   No - copy requested  Would patient like information on creating a medical advance directive?      No - Patient declined     Current Medications (verified) Outpatient Encounter Medications as of 02/02/2023  Medication Sig   calcium carbonate (TUMS) 500 MG chewable tablet Chew 1 tablet (200 mg of elemental calcium total) by mouth 3 (three) times daily.   Cholecalciferol (VITAMIN D) 125 MCG (5000 UT) CAPS Take 1 capsule by mouth once a week.   glucosamine-chondroitin 500-400 MG tablet Take 1 tablet by mouth daily. With msm   mesalamine (LIALDA) 1.2 g EC tablet Take 2 tablets by mouth once a day   Multiple Vitamin (MULTIVITAMIN) tablet Take 1 tablet by mouth daily.   Omega-3 Fatty Acids (FISH OIL) 1000 MG CAPS Take 2,000 mg by  mouth daily.   Polyethyl Glycol-Propyl Glycol (SYSTANE FREE OP) Place 2 drops into both eyes 5 (five) times daily as needed (dry eyes).   propranolol (INDERAL) 20 MG tablet TAKE TWO TABLETS BY MOUTH DAILY   rivaroxaban (XARELTO) 20 MG TABS tablet Take 1 tablet (20 mg total) by mouth daily with supper.   rosuvastatin (CRESTOR) 20 MG tablet Take 1 tablet (20 mg total) by mouth daily.   influenza vaccine adjuvanted (FLUAD QUADRIVALENT) 0.5 ML injection Inject into the muscle.   RSV vaccine recomb adjuvanted (AREXVY) 120 MCG/0.5ML injection Inject into the muscle.   [DISCONTINUED] calcium carbonate (TUMS - DOSED IN MG ELEMENTAL CALCIUM) 500 MG chewable tablet Chew 1 tablet by mouth at bedtime as needed for indigestion or heartburn.   [DISCONTINUED] mesalamine (LIALDA) 1.2 g EC tablet Take 2 tablets by mouth once a day   No facility-administered encounter medications on file as of 02/02/2023.    Allergies (verified) Cefuroxime and Poison ivy extract   History: 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   Crohn's disease (HTemperance    Deafness in right ear, wears a microphone    Decreased peripheral vision 11/04/2011   Essential tremor    HLD (hyperlipidemia)    Inguinal hernia 07/2016   Retinal detachment    Wears hearing  aid, left ear    Past Surgical History:  Procedure Laterality Date   ACOUSTIC NEUROMA RESECTION Right    BROW LIFT AND BLEPHAROPLASTY Bilateral 10/11/2012   CATARACT EXTRACTION W/ INTRAOCULAR LENS  IMPLANT, BILATERAL Bilateral    HERNIA REPAIR     INGUINAL HERNIA REPAIR Right 07/16/2016   Procedure: LAPAROSCOPIC RIGHT INGUINAL HERNIA;  Surgeon: Coralie Keens, MD;  Location: [place];  Service: General;  Laterality: Right;   INSERTION OF MESH Right 07/16/2016   Procedure: INSERTION OF MESH;  Surgeon: Coralie Keens, MD;  Location: [place];  Service: General;  Laterality: Right;   MELANOMA EXCISION  01/2022    scalp   MOHS SURGERY N/A 11/05/2021   scalp   PARATHYROIDECTOMY Left 05/07/2022   Procedure: MINIMALLY INVASIVE LEFT INFERIOR PARATHYROIDECTOMY;  Surgeon: Ralene Ok, MD;  Location: [place];  Service: General;  Laterality: Left;   RETINAL DETACHMENT SURGERY Left    TONSILLECTOMY     Family History  Problem Relation Age of Onset   Diabetes Mother    Dementia Mother    Diabetes Sister    Hyperparathyroidism Neg Hx    Social History   Socioeconomic History   Marital status: Married    Spouse name: Toby   Number of children: 1   Years of education: Not on file   Highest education level: Not on file  Occupational History   Occupation: retired  Tobacco Use   Smoking status: Former    Packs/day: 1.00    Years: 4.00    Total pack years: 4.00    Types: Cigarettes    Quit date: 12/09/1979    Years since quitting: 43.1   Smokeless tobacco: Never   Tobacco comments:    Quit 38 years ago  Vaping Use   Vaping Use: Some days  Substance and Sexual Activity   Alcohol use: Yes    Alcohol/week: 14.0 standard drinks of alcohol    Types: 14 Cans of beer per week   Drug use: No   Sexual activity: Yes  Other Topics Concern   Not on file  Social History Narrative   Not on file   Social Determinants of Health   Financial Resource Strain: Low Risk  (02/02/2023)   Overall Financial Resource Strain (CARDIA)    Difficulty of Paying Living Expenses: Not hard at all  Food Insecurity: No Food Insecurity (02/02/2023)   Hunger Vital Sign    Worried About Running Out of Food in the Last Year: Never true    Ran Out of Food in the Last Year: Never true  Transportation Needs: No Transportation Needs (02/02/2023)   PRAPARE - Hydrologist (Medical): No    Lack of Transportation (Non-Medical): No  Physical Activity: Insufficiently Active (02/02/2023)   Exercise Vital Sign    Days of Exercise per Week: 6 days    Minutes of Exercise per Session: 20 min  Stress: No  Stress Concern Present (02/02/2023)   [place]    Feeling of Stress : Not at all  Social Connections: [place] (02/02/2023)   Social Connection and Isolation Panel [NHANES]    Frequency of Communication with Friends and Family: More than three times a week    Frequency of Social Gatherings with Friends and Family: More than three times a week    Attends Religious Services: 1 to 4 times per year    Active Member of [employer] or Organizations:  Yes    Attends Club or Organization Meetings: 1 to 4 times per year    Marital Status: Married    Tobacco Counseling Counseling given: Not Answered Tobacco comments: Quit 38 years ago   Clinical Intake:  Pre-visit preparation completed: Yes  Pain : No/denies pain     Diabetes: No  How often do you need to have someone help you when you read instructions, pamphlets, or other written materials from your doctor or pharmacy?: 1 - Never  Diabetic?no   Interpreter Needed?: No  Information entered by :: Charlott Rakes, LPN   Activities of Daily Living    02/02/2023    8:47 AM 04/29/2022   10:18 AM  In your present state of health, do you have any difficulty performing the following activities:  Hearing? 1   Comment hearing aids   Vision? 0   Difficulty concentrating or making decisions? 0   Walking or climbing stairs? 0   Dressing or bathing? 0   Doing errands, shopping? 0 0  Preparing Food and eating ? N   Using the Toilet? N   In the past six months, have you accidently leaked urine? Y   Comment at times   Do you have problems with loss of bowel control? N   Managing your Medications? N   Managing your Finances? N   Housekeeping or managing your Housekeeping? N     Patient Care Team: Vivi Barrack, MD as PCP - General (Family Medicine) Ronald Lobo, MD as Consulting Physician (Gastroenterology) Derrek Gu as Consulting Physician  (Dentistry) Marygrace Drought, MD as Consulting Physician (Ophthalmology) Mylinda Latina as Consulting Physician (Ophthalmology) Johna Roles, MD as Consulting Physician  Indicate any recent Medical Services you may have received from other than [facility] providers in the past year (date may be approximate).     Assessment:   This is a routine wellness examination for [place].  Hearing/Vision screen Hearing Screening - Comments:: Pt wears hearing aids  Vision Screening - Comments:: Pt follows up with dr Satira Sark for annual eye exams   Dietary issues and exercise activities discussed: Current Exercise Habits: Home exercise routine, Type of exercise: walking, Time (Minutes): 20, Frequency (Times/Week): 6, Weekly Exercise (Minutes/Week): 120   Goals Addressed             This Visit's Progress    Patient Stated       Lose some weight        Depression Screen    02/02/2023    8:44 AM 06/12/2022    3:03 PM 01/20/2022    8:36 AM 01/14/2021    8:59 AM 11/23/2019    9:33 AM 07/22/2018    9:47 AM 07/21/2017    8:29 AM  PHQ 2/9 Scores  PHQ - 2 Score 1 0 0 0 0 0 0    Fall Risk    02/02/2023    8:47 AM 06/12/2022    3:03 PM 01/20/2022    8:39 AM 01/14/2021    9:02 AM 07/22/2018    9:47 AM  Fall Risk   Falls in the past year? 1 0 1 1 Yes  Number falls in past yr: 1 0 '1 1 1  '$ Injury with Fall? 0 0 0 0   Risk for fall due to : Impaired vision;Impaired balance/gait No Fall Risks Impaired vision Impaired vision;Impaired balance/gait Other (Comment)  Risk for fall due to: Comment     drinking   Follow up Falls prevention discussed  Falls prevention discussed Falls  prevention discussed Education provided    FALL RISK PREVENTION PERTAINING TO THE HOME:  Any stairs in or around the home? Yes  If so, are there any without handrails? No  Home free of loose throw rugs in walkways, pet beds, electrical cords, etc? Yes  Adequate lighting in your home to reduce risk of falls? Yes   ASSISTIVE DEVICES  UTILIZED TO PREVENT FALLS:  Life alert? No  Use of a cane, walker or w/c? No  Grab bars in the bathroom? No  Shower chair or bench in shower? Yes  Elevated toilet seat or a handicapped toilet? No   TIMED UP AND GO:  Was the test performed? Yes .  Length of time to ambulate 10 feet: 15 sec.   Gait steady and fast without use of assistive device  Cognitive Function:        02/02/2023    8:49 AM 01/20/2022    8:41 AM 01/14/2021    9:05 AM  6CIT Screen  What Year? 0 points 0 points 0 points  What month? 0 points 0 points 0 points  What time? 0 points 0 points   Count back from 20 0 points 0 points 0 points  Months in reverse 0 points 0 points 0 points  Repeat phrase 0 points 0 points 0 points  Total Score 0 points 0 points     Immunizations Immunization History  Administered Date(s) Administered   COVID-19, mRNA, vaccine(Comirnaty)12 years and older 10/21/2022   Fluad Quad(high Dose 65+) 08/02/2019, 09/10/2021, 10/21/2022   Hep A / Hep B 03/03/2013   Hepatitis A 02/25/2013, 03/03/2013, 03/10/2013, 03/09/2014   Hepatitis B 02/25/2013, 03/03/2013, 03/10/2013, 03/09/2014   Influenza Split 09/18/2009   Influenza, High Dose Seasonal PF 08/30/2018   Influenza-Unspecified 08/22/2010, 09/09/2011, 09/25/2012, 09/18/2014, 09/30/2017, 08/30/2018, 08/03/2019, 09/03/2020   Moderna SARS-COV2 Booster Vaccination 10/29/2020   PFIZER Comirnaty(Gray Top)Covid-19 Tri-Sucrose Vaccine 06/03/2021   PFIZER(Purple Top)SARS-COV-2 Vaccination 01/02/2020, 01/09/2020   Pfizer Covid-19 Vaccine Bivalent Booster 33yr & up 02/28/2022   Respiratory Syncytial Virus Vaccine,Recomb Aduvanted(Arexvy) 11/17/2022   Td 06/22/2002   Tdap 11/12/2009, 04/01/2021    TDAP status: Up to date  Flu Vaccine status: Up to date  Pneumococcal vaccine status: Due, Education has been provided regarding the importance of this vaccine. Advised may receive this vaccine at local pharmacy or Health Dept. Aware to provide a  copy of the vaccination record if obtained from local pharmacy or Health Dept. Verbalized acceptance and understanding.  Covid-19 vaccine status: Completed vaccines  Qualifies for Shingles Vaccine? Yes   Zostavax completed No   Shingrix Completed?: No.    Education has been provided regarding the importance of this vaccine. Patient has been advised to call insurance company to determine out of pocket expense if they have not yet received this vaccine. Advised may also receive vaccine at local pharmacy or Health Dept. Verbalized acceptance and understanding.  Screening Tests Health Maintenance  Topic Date Due   Zoster Vaccines- Shingrix (1 of 2) 05/03/2023 (Originally 07/14/2000)   Pneumonia Vaccine 73 Years old (1 of 1 - PCV) 02/03/2024 (Originally 07/15/2015)   Medicare Annual Wellness (AWV)  02/03/2024   COLONOSCOPY (Pts 45-461yrInsurance coverage will need to be confirmed)  06/18/2024   DTaP/Tdap/Td (4 - Td or Tdap) 04/02/2031   INFLUENZA VACCINE  Completed   COVID-19 Vaccine  Completed   Hepatitis C Screening  Completed   HPV VACCINES  Aged Out    Health Maintenance  There are no preventive care reminders to  display for this patient.   Colorectal cancer screening: Type of screening: Colonoscopy. Completed 06/18/21. Repeat every 3 years   Additional Screening:  Hepatitis C Screening:  Completed 06/01/17  Vision Screening: Recommended annual ophthalmology exams for early detection of glaucoma and other disorders of the eye. Is the patient up to date with their annual eye exam?  Yes  Who is the provider or what is the name of the office in which the patient attends annual eye exams? Dr Satira Sark  If pt is not established with a provider, would they like to be referred to a provider to establish care? No .   Dental Screening: Recommended annual dental exams for proper oral hygiene  Community Resource Referral / Chronic Care Management: CRR required this visit?  No   CCM required  this visit?  No      Plan:     I have personally reviewed and noted the following in the patient's chart:   Medical and social history Use of alcohol, tobacco or illicit drugs  Current medications and supplements including opioid prescriptions. Patient is not currently taking opioid prescriptions. Functional ability and status Nutritional status Physical activity Advanced directives List of other physicians Hospitalizations, surgeries, and ER visits in previous 12 months Vitals Screenings to include cognitive, depression, and falls Referrals and appointments  In addition, I have reviewed and discussed with patient certain preventive protocols, quality metrics, and best practice recommendations. A written personalized care plan for preventive services as well as general preventive health recommendations were provided to patient.     Willette Brace, LPN   [id number]   Nurse Notes: none

## 2023-02-23 ENCOUNTER — Other Ambulatory Visit: Payer: Self-pay

## 2023-02-24 DIAGNOSIS — H04123 Dry eye syndrome of bilateral lacrimal glands: Secondary | ICD-10-CM | POA: Diagnosis not present

## 2023-02-24 DIAGNOSIS — H31002 Unspecified chorioretinal scars, left eye: Secondary | ICD-10-CM | POA: Diagnosis not present

## 2023-02-24 DIAGNOSIS — H35372 Puckering of macula, left eye: Secondary | ICD-10-CM | POA: Diagnosis not present

## 2023-02-24 DIAGNOSIS — Z961 Presence of intraocular lens: Secondary | ICD-10-CM | POA: Diagnosis not present

## 2023-02-24 DIAGNOSIS — H43813 Vitreous degeneration, bilateral: Secondary | ICD-10-CM | POA: Diagnosis not present

## 2023-04-07 ENCOUNTER — Other Ambulatory Visit: Payer: Self-pay | Admitting: Family Medicine

## 2023-04-07 ENCOUNTER — Other Ambulatory Visit: Payer: Self-pay

## 2023-04-07 DIAGNOSIS — E785 Hyperlipidemia, unspecified: Secondary | ICD-10-CM

## 2023-04-08 ENCOUNTER — Other Ambulatory Visit: Payer: Self-pay

## 2023-04-08 ENCOUNTER — Other Ambulatory Visit (HOSPITAL_BASED_OUTPATIENT_CLINIC_OR_DEPARTMENT_OTHER): Payer: Self-pay

## 2023-04-08 MED ORDER — PROPRANOLOL HCL 20 MG PO TABS
40.0000 mg | ORAL_TABLET | Freq: Every day | ORAL | 3 refills | Status: DC
  Filled 2023-04-08: qty 60, 30d supply, fill #0
  Filled 2023-04-08: qty 180, 90d supply, fill #0
  Filled 2023-06-17: qty 60, 30d supply, fill #1
  Filled 2023-07-09 – 2023-07-10 (×2): qty 60, 30d supply, fill #2
  Filled 2023-08-17 (×2): qty 60, 30d supply, fill #3
  Filled 2023-09-25: qty 60, 30d supply, fill #4
  Filled 2023-10-29 – 2023-11-13 (×2): qty 60, 30d supply, fill #5
  Filled 2023-12-23: qty 60, 30d supply, fill #6
  Filled 2024-02-08: qty 60, 30d supply, fill #7
  Filled 2024-03-18: qty 60, 30d supply, fill #8

## 2023-04-08 MED ORDER — ROSUVASTATIN CALCIUM 20 MG PO TABS
20.0000 mg | ORAL_TABLET | Freq: Every day | ORAL | 0 refills | Status: DC
  Filled 2023-04-08: qty 30, 30d supply, fill #0
  Filled 2023-04-08: qty 90, 90d supply, fill #0
  Filled 2023-06-17: qty 30, 30d supply, fill #1
  Filled 2023-07-09 – 2023-07-10 (×2): qty 30, 30d supply, fill #2

## 2023-04-08 MED ORDER — RIVAROXABAN 20 MG PO TABS
20.0000 mg | ORAL_TABLET | Freq: Every day | ORAL | 2 refills | Status: DC
  Filled 2023-04-08 (×2): qty 30, 30d supply, fill #0
  Filled 2023-06-17: qty 30, 30d supply, fill #1
  Filled 2023-07-09 – 2023-07-10 (×2): qty 30, 30d supply, fill #2

## 2023-05-07 ENCOUNTER — Other Ambulatory Visit: Payer: Self-pay

## 2023-06-01 DIAGNOSIS — L821 Other seborrheic keratosis: Secondary | ICD-10-CM | POA: Diagnosis not present

## 2023-06-01 DIAGNOSIS — D2272 Melanocytic nevi of left lower limb, including hip: Secondary | ICD-10-CM | POA: Diagnosis not present

## 2023-06-01 DIAGNOSIS — D2271 Melanocytic nevi of right lower limb, including hip: Secondary | ICD-10-CM | POA: Diagnosis not present

## 2023-06-01 DIAGNOSIS — L570 Actinic keratosis: Secondary | ICD-10-CM | POA: Diagnosis not present

## 2023-06-01 DIAGNOSIS — D225 Melanocytic nevi of trunk: Secondary | ICD-10-CM | POA: Diagnosis not present

## 2023-06-01 DIAGNOSIS — D2262 Melanocytic nevi of left upper limb, including shoulder: Secondary | ICD-10-CM | POA: Diagnosis not present

## 2023-06-01 DIAGNOSIS — L814 Other melanin hyperpigmentation: Secondary | ICD-10-CM | POA: Diagnosis not present

## 2023-06-01 DIAGNOSIS — D1721 Benign lipomatous neoplasm of skin and subcutaneous tissue of right arm: Secondary | ICD-10-CM | POA: Diagnosis not present

## 2023-06-01 DIAGNOSIS — Z85820 Personal history of malignant melanoma of skin: Secondary | ICD-10-CM | POA: Diagnosis not present

## 2023-06-01 DIAGNOSIS — L918 Other hypertrophic disorders of the skin: Secondary | ICD-10-CM | POA: Diagnosis not present

## 2023-06-01 DIAGNOSIS — L820 Inflamed seborrheic keratosis: Secondary | ICD-10-CM | POA: Diagnosis not present

## 2023-06-01 DIAGNOSIS — D1801 Hemangioma of skin and subcutaneous tissue: Secondary | ICD-10-CM | POA: Diagnosis not present

## 2023-06-17 ENCOUNTER — Other Ambulatory Visit: Payer: Self-pay

## 2023-06-30 ENCOUNTER — Telehealth: Payer: Self-pay | Admitting: *Deleted

## 2023-06-30 NOTE — Telephone Encounter (Signed)
I connected with Antonietta Jewel on 7/23 at [phone] by telephone and verified that I am speaking with the correct person using two identifiers. According to the patient's chart they are due for physical with [facility]. Pt scheduled. There are no transportation issues at this time. Nothing further was needed at the end of our conversation.

## 2023-07-09 ENCOUNTER — Other Ambulatory Visit: Payer: Self-pay

## 2023-07-10 ENCOUNTER — Other Ambulatory Visit: Payer: Self-pay

## 2023-07-16 ENCOUNTER — Encounter: Payer: PPO | Admitting: Family Medicine

## 2023-08-17 ENCOUNTER — Other Ambulatory Visit: Payer: Self-pay

## 2023-08-17 ENCOUNTER — Other Ambulatory Visit: Payer: Self-pay | Admitting: Family Medicine

## 2023-08-17 DIAGNOSIS — E785 Hyperlipidemia, unspecified: Secondary | ICD-10-CM

## 2023-08-17 MED ORDER — RIVAROXABAN 20 MG PO TABS
20.0000 mg | ORAL_TABLET | Freq: Every day | ORAL | 0 refills | Status: DC
  Filled 2023-08-17: qty 30, 30d supply, fill #0

## 2023-08-17 MED ORDER — ROSUVASTATIN CALCIUM 20 MG PO TABS
20.0000 mg | ORAL_TABLET | Freq: Every day | ORAL | 0 refills | Status: DC
  Filled 2023-08-17: qty 30, 30d supply, fill #0

## 2023-08-18 ENCOUNTER — Other Ambulatory Visit: Payer: Self-pay

## 2023-09-22 ENCOUNTER — Encounter: Payer: Self-pay | Admitting: Family Medicine

## 2023-09-22 ENCOUNTER — Ambulatory Visit: Payer: PPO | Admitting: Family Medicine

## 2023-09-22 VITALS — BP 148/88 | HR 70 | Temp 98.4°F | Ht 69.0 in | Wt 194.2 lb

## 2023-09-22 DIAGNOSIS — G250 Essential tremor: Secondary | ICD-10-CM

## 2023-09-22 DIAGNOSIS — G3184 Mild cognitive impairment, so stated: Secondary | ICD-10-CM

## 2023-09-22 DIAGNOSIS — E785 Hyperlipidemia, unspecified: Secondary | ICD-10-CM | POA: Diagnosis not present

## 2023-09-22 DIAGNOSIS — Z23 Encounter for immunization: Secondary | ICD-10-CM

## 2023-09-22 DIAGNOSIS — R400 Somnolence: Secondary | ICD-10-CM

## 2023-09-22 DIAGNOSIS — K5080 Crohn's disease of both small and large intestine without complications: Secondary | ICD-10-CM | POA: Diagnosis not present

## 2023-09-22 DIAGNOSIS — I2699 Other pulmonary embolism without acute cor pulmonale: Secondary | ICD-10-CM | POA: Diagnosis not present

## 2023-09-22 DIAGNOSIS — Z0000 Encounter for general adult medical examination without abnormal findings: Secondary | ICD-10-CM

## 2023-09-22 DIAGNOSIS — E210 Primary hyperparathyroidism: Secondary | ICD-10-CM

## 2023-09-22 DIAGNOSIS — L578 Other skin changes due to chronic exposure to nonionizing radiation: Secondary | ICD-10-CM

## 2023-09-22 HISTORY — DX: Mild cognitive impairment of uncertain or unknown etiology: G31.84

## 2023-09-22 HISTORY — DX: Somnolence: R40.0

## 2023-09-22 LAB — COMPREHENSIVE METABOLIC PANEL
ALT: 24 U/L (ref 0–53)
AST: 27 U/L (ref 0–37)
Albumin: 4.4 g/dL (ref 3.5–5.2)
Alkaline Phosphatase: 61 U/L (ref 39–117)
BUN: 18 mg/dL (ref 6–23)
CO2: 28 meq/L (ref 19–32)
Calcium: 9.6 mg/dL (ref 8.4–10.5)
Chloride: 104 meq/L (ref 96–112)
Creatinine, Ser: 0.91 mg/dL (ref 0.40–1.50)
GFR: 83.8 mL/min (ref >60.00)
Glucose, Bld: 96 mg/dL (ref 70–99)
Potassium: 4.2 meq/L (ref 3.5–5.1)
Sodium: 140 meq/L (ref 135–145)
Total Bilirubin: 0.7 mg/dL (ref 0.2–1.2)
Total Protein: 6.7 g/dL (ref 6.0–8.3)

## 2023-09-22 LAB — LIPID PANEL
Cholesterol: 194 mg/dL (ref 0–200)
HDL: 36 mg/dL — ABNORMAL LOW (ref >39.00)
LDL Cholesterol: 113 mg/dL — ABNORMAL HIGH (ref 0–99)
NonHDL: 158.21
Total CHOL/HDL Ratio: 5
Triglycerides: 224 mg/dL — ABNORMAL HIGH (ref 0.0–149.0)
VLDL: 44.8 mg/dL — ABNORMAL HIGH (ref 0.0–40.0)

## 2023-09-22 LAB — CBC
HCT: 48.7 % (ref 39.0–52.0)
Hemoglobin: 16 g/dL (ref 13.0–17.0)
MCHC: 32.8 g/dL (ref 30.0–36.0)
MCV: 92.7 fL (ref 78.0–100.0)
Platelets: 202 10*3/uL (ref 150.0–400.0)
RBC: 5.26 Mil/uL (ref 4.22–5.81)
RDW: 13.8 % (ref 11.5–15.5)
WBC: 7.2 10*3/uL (ref 4.0–10.5)

## 2023-09-22 LAB — HEMOGLOBIN A1C: Hgb A1c MFr Bld: 5.6 % (ref 4.6–6.5)

## 2023-09-22 LAB — VITAMIN B12: Vitamin B-12: 289 pg/mL (ref 211–911)

## 2023-09-22 LAB — TSH: TSH: 3.46 u[IU]/mL (ref 0.35–5.50)

## 2023-09-22 LAB — PSA: PSA: 3.63 ng/mL (ref 0.10–4.00)

## 2023-09-22 NOTE — Assessment & Plan Note (Signed)
On chronic anticoagulation with Xarelto 20 mg daily.

## 2023-09-22 NOTE — Assessment & Plan Note (Signed)
Concern for possible OSA.  Will check labs today though depending on results will need referral for sleep study.

## 2023-09-22 NOTE — Progress Notes (Signed)
Chief Complaint:  Shaun Armstrong is a 72 y.o. male who presents today for his annual comprehensive physical exam.    Assessment/Plan:  Chronic Problems Addressed Today: Crohn's disease of both small and large intestine without complication (HCC) Follows with GI however previous gastroenterologist retired.  He will be establishing with a new gastroenterologist soon.  Did have a colonoscopy about a year ago or so which was normal.  He has known Lialda 2.4 g daily.  Recently has had a little bit more loose stools and watery diarrhea.  No melena or hematochezia.  Recommended trial of Metamucil to see if this improves his symptoms.  He will discuss further with his gastroenterologist.  Essential tremor Symptoms have progressed since our last visit a couple of years ago.  He is currently on propranolol 20 mg twice daily.  He is interested in seeing a neurologist for this.  We did discuss increasing dose of propranolol however he declined.  Will place referral today.  Hyperlipidemia Check lipids.  Is on Crestor 20 mg daily.  Discussed lifestyle modifications.  Sun-damaged skin Follows with dermatology for this.  Has a few AK's noted on exam today though will defer further management to dermatology.  Hyperparathyroidism, primary [facility]) Following with endocrinology.  Doing well status post parathyroidectomy.  Will check labs today.  Bilateral pulmonary embolism (HCC) On chronic anticoagulation with Xarelto 20 mg daily.  Mild cognitive impairment Symptoms are currently very mild.  Is having a few issues with short-term memory.  No word finding difficulties.  No disorientation to person, place, or time.  We will check labs today.  Reassured patient.  He will let us know if he has any worsening symptoms.  We did discuss recommendations for keeping mind and brain active as well.  Daytime somnolence Concern for possible OSA.  Will check labs today though depending on results will need referral for  sleep study.  Preventative Healthcare: Flu shot given today.  Check labs.  He can get shingles vaccine at the pharmacy.  Patient Counseling(The following topics were reviewed and/or handout was given):  -Nutrition: Stressed importance of moderation in sodium/caffeine intake, saturated fat and cholesterol, caloric balance, sufficient intake of fresh fruits, vegetables, and fiber.  -Stressed the importance of regular exercise.   -Substance Abuse: Discussed cessation/primary prevention of tobacco, alcohol, or other drug use; driving or other dangerous activities under the influence; availability of treatment for abuse.   -Injury prevention: Discussed safety belts, safety helmets, smoke detector, smoking near bedding or upholstery.   -Sexuality: Discussed sexually transmitted diseases, partner selection, use of condoms, avoidance of unintended pregnancy and contraceptive alternatives.   -Dental health: Discussed importance of regular tooth brushing, flossing, and dental visits.  -Health maintenance and immunizations reviewed. Please refer to Health maintenance section.  Return to care in 1 year for next preventative visit.     Subjective:  HPI:  See Assessment / plan for status of chronic conditions.   He has noticed a little more issues with short term recall recently. He has been recently taking turmeric which does seem to be helping. Sometimes forgets people names. No loss of direction. No word finding difficulties. No misplacing items.   He does note that he has been sleeping a little more than normal.  This is going on for several months.  Sleeping 7 hours at night but then falls asleep during the day and can sleep for 2 hours at a time. Sometimes naps for 3-4 per day. He does not think that  he snores.   Lifestyle Diet: Balanced. Plenty of fruits and vegetables.      09/22/2023    9:14 AM  Depression screen PHQ 2/9  Decreased Interest 0  Down, Depressed, Hopeless 0  PHQ - 2 Score  0    There are no preventive care reminders to display for this patient.    ROS: Per HPI, otherwise a complete review of systems was negative.   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   Crohn's disease (HCC)    Deafness in right ear, wears a microphone    Decreased peripheral vision 11/04/2011   Essential tremor    HLD (hyperlipidemia)    Inguinal hernia 07/2016   Retinal detachment    Wears hearing aid, left ear    Patient Active Problem List   Diagnosis Date Noted   Mild cognitive impairment 09/22/2023   Daytime somnolence 09/22/2023   S/P parathyroidectomy 05/07/2022   Bilateral pulmonary embolism (HCC) 10/05/2020   Acute deep vein thrombosis (DVT) of proximal vein of right lower extremity (HCC) 10/05/2020   Hyperparathyroidism, primary (HCC) 06/07/2020   Meralgia paresthetica of right side 10/25/2018   Gastroesophageal reflux disease without esophagitis 10/25/2018   Sun-damaged skin 08/03/2017   Essential tremor 06/05/2016   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   Crohn's disease of both small and large intestine without complication (HCC) 02/28/2014   Hyperlipidemia 10/05/2012   Decreased peripheral vision 11/04/2011   Past Surgical History:  Procedure Laterality Date   ACOUSTIC NEUROMA RESECTION Right    BROW LIFT AND BLEPHAROPLASTY Bilateral 10/11/2012   CATARACT EXTRACTION W/ INTRAOCULAR LENS  IMPLANT, BILATERAL Bilateral    HERNIA REPAIR     INGUINAL HERNIA REPAIR Right 07/16/2016   Procedure: LAPAROSCOPIC RIGHT INGUINAL HERNIA;  Surgeon: Abigail Miyamoto, MD;  Location: [facility];  Service: General;  Laterality: Right;   INSERTION OF MESH Right 07/16/2016   Procedure: INSERTION OF MESH;  Surgeon: Abigail Miyamoto, MD;  Location: [facility];  Service: General;  Laterality: Right;   MELANOMA EXCISION  01/2022   scalp   MOHS SURGERY N/A  11/05/2021   scalp   PARATHYROIDECTOMY Left 05/07/2022   Procedure: MINIMALLY INVASIVE LEFT INFERIOR PARATHYROIDECTOMY;  Surgeon: Axel Filler, MD;  Location: [facility];  Service: General;  Laterality: Left;   RETINAL DETACHMENT SURGERY Left    TONSILLECTOMY      Family History  Problem Relation Age of Onset   Diabetes Mother    Dementia Mother    Diabetes Sister    Hyperparathyroidism Neg Hx     Medications- reviewed and updated Current Outpatient Medications  Medication Sig Dispense Refill   Cholecalciferol (VITAMIN D) 125 MCG (5000 UT) CAPS Take 1 capsule by mouth once a week.     glucosamine-chondroitin 500-400 MG tablet Take 1 tablet by mouth daily. With msm     mesalamine (LIALDA) 1.2 g EC tablet Take 2 tablets by mouth once a day 120 tablet 5   Multiple Vitamin (MULTIVITAMIN) tablet Take 1 tablet by mouth daily.     Omega-3 Fatty Acids (FISH OIL) 1000 MG CAPS Take 2,000 mg by mouth daily.     Polyethyl Glycol-Propyl Glycol (SYSTANE FREE OP) Place 2 drops into both eyes 5 (five) times daily as needed (dry eyes).     propranolol (INDERAL) 20 MG tablet Take 2 tablets (40 mg total) by mouth daily. 180 tablet 3   rivaroxaban (  XARELTO) 20 MG TABS tablet Take 1 tablet (20 mg total) by mouth daily with supper. 30 tablet 0   rosuvastatin (CRESTOR) 20 MG tablet Take 1 tablet (20 mg total) by mouth daily. 30 tablet 0   No current facility-administered medications for this visit.    Allergies-reviewed and updated Allergies  Allergen Reactions   Cefuroxime Other (See Comments)    Severe dehydration, not certain if this is true     Poison Ivy Extract Rash    Social History   Socioeconomic History   Marital status: Married    Spouse name: Toby   Number of children: 1   Years of education: Not on file   Highest education level: Not on file  Occupational History   Occupation: retired  Tobacco Use   Smoking status: Former    Current packs/day: 0.00    Average packs/day: 1  pack/day for 4.0 years (4.0 ttl pk-yrs)    Types: Cigarettes    Start date: 12/09/1975    Quit date: 12/09/1979    Years since quitting: 43.8   Smokeless tobacco: Never   Tobacco comments:    Quit 38 years ago  Vaping Use   Vaping status: Some Days  Substance and Sexual Activity   Alcohol use: Yes    Alcohol/week: 14.0 standard drinks of alcohol    Types: 14 Cans of beer per week   Drug use: No   Sexual activity: Yes  Other Topics Concern   Not on file  Social History Narrative   Not on file   Social Determinants of Health   Financial Resource Strain: Low Risk  (02/02/2023)   Overall Financial Resource Strain (CARDIA)    Difficulty of Paying Living Expenses: Not hard at all  Food Insecurity: No Food Insecurity (02/02/2023)   Hunger Vital Sign    Worried About Running Out of Food in the Last Year: Never true    Ran Out of Food in the Last Year: Never true  Transportation Needs: No Transportation Needs (06/30/2023)   PRAPARE - Administrator, Civil Service (Medical): No    Lack of Transportation (Non-Medical): No  Physical Activity: Insufficiently Active (02/02/2023)   Exercise Vital Sign    Days of Exercise per Week: 6 days    Minutes of Exercise per Session: 20 min  Stress: No Stress Concern Present (02/02/2023)   [employer] of Occupational Health - Occupational Stress Questionnaire    Feeling of Stress : Not at all  Social Connections: Socially Integrated (02/02/2023)   Social Connection and Isolation Panel [NHANES]    Frequency of Communication with Friends and Family: More than three times a week    Frequency of Social Gatherings with Friends and Family: More than three times a week    Attends Religious Services: 1 to 4 times per year    Active Member of [employer] or Organizations: Yes    Attends Banker Meetings: 1 to 4 times per year    Marital Status: Married        Objective:  Physical Exam: BP (!) 148/88   Pulse 70   Temp 98.4 F (36.9  C) (Temporal)   Ht 5\' 9"  (1.753 m)   Wt 194 lb 3.2 oz (88.1 kg)   SpO2 97%   BMI 28.68 kg/m   Body mass index is 28.68 kg/m. Wt Readings from Last 3 Encounters:  09/22/23 194 lb 3.2 oz (88.1 kg)  02/02/23 205 lb 9.6 oz (93.3 kg)  06/12/22 198  lb 3.2 oz (89.9 kg)   Gen: NAD, resting comfortably HEENT: TMs normal bilaterally. OP clear. No thyromegaly noted.  CV: RRR with no murmurs appreciated Pulm: NWOB, CTAB with no crackles, wheezes, or rhonchi GI: Normal bowel sounds present. Soft, Nontender, Nondistended. MSK: no edema, cyanosis, or clubbing noted Skin: warm, dry Neuro: CN2-12 grossly intact. Strength 5/5 in upper and lower extremities. Reflexes symmetric and intact bilaterally.  Psych: Normal affect and thought content     Makayla Lanter M. Jimmey Ralph, MD 09/22/2023 10:12 AM

## 2023-09-22 NOTE — Patient Instructions (Addendum)
It was very nice to see you today!  We will check blood work today.  Please continue to eat healthy and stay active.  Please try keeping her brain active daily as well.  We may need to refer you to have a sleep study done depending on results of your blood work.  Please try taking Metamucil for your diarrhea.  And discuss this with the gastroenterologist soon.  I will refer you to see the neurologist for your tremor.  Please let me know if you need a referral to see ENT.  Please continue to work on diet and exercise.  See back in a few next physical.  Return in about 1 year (around 09/21/2024) for Annual Physical.   Take care, Dr Jimmey Ralph  PLEASE NOTE:  If you had any lab tests, please let us know if you have not heard back within a few days. You may see your results on mychart before we have a chance to review them but we will give you a call once they are reviewed by [place].   If we ordered any referrals today, please let us know if you have not heard from their office within the next week.   If you had any urgent prescriptions sent in today, please check with the pharmacy within an hour of our visit to make sure the prescription was transmitted appropriately.   Please try these tips to maintain a healthy lifestyle:  Eat at least 3 REAL meals and 1-2 snacks per day.  Aim for no more than 5 hours between eating.  If you eat breakfast, please do so within one hour of getting up.   Each meal should contain half fruits/vegetables, one quarter protein, and one quarter carbs (no bigger than a computer mouse)  Cut down on sweet beverages. This includes juice, soda, and sweet tea.   Drink at least 1 glass of water with each meal and aim for at least 8 glasses per day  Exercise at least 150 minutes every week.     Preventive Care 52 Years and Older, Male Preventive care refers to lifestyle choices and visits with your health care provider that can promote health and wellness. Preventive  care visits are also called wellness exams. What can I expect for my preventive care visit? Counseling During your preventive care visit, your health care provider may ask about your: Medical history, including: Past medical problems. Family medical history. History of falls. Current health, including: Emotional well-being. Home life and relationship well-being. Sexual activity. Memory and ability to understand (cognition). Lifestyle, including: Alcohol, nicotine or tobacco, and drug use. Access to firearms. Diet, exercise, and sleep habits. Work and work Astronomer. Sunscreen use. Safety issues such as seatbelt and bike helmet use. Physical exam Your health care provider will check your: Height and weight. These may be used to calculate your BMI (body mass index). BMI is a measurement that tells if you are at a healthy weight. Waist circumference. This measures the distance around your waistline. This measurement also tells if you are at a healthy weight and may help predict your risk of certain diseases, such as type 2 diabetes and high blood pressure. Heart rate and blood pressure. Body temperature. Skin for abnormal spots. What immunizations do I need?  Vaccines are usually given at various ages, according to a schedule. Your health care provider will recommend vaccines for you based on your age, medical history, and lifestyle or other factors, such as travel or where you work. What tests  do I need? Screening Your health care provider may recommend screening tests for certain conditions. This may include: Lipid and cholesterol levels. Diabetes screening. This is done by checking your blood sugar (glucose) after you have not eaten for a while (fasting). Hepatitis C test. Hepatitis B test. HIV (human immunodeficiency virus) test. STI (sexually transmitted infection) testing, if you are at risk. Lung cancer screening. Colorectal cancer screening. Prostate cancer  screening. Abdominal aortic aneurysm (AAA) screening. You may need this if you are a current or former smoker. Talk with your health care provider about your test results, treatment options, and if necessary, the need for more tests. Follow these instructions at home: Eating and drinking  Eat a diet that includes fresh fruits and vegetables, whole grains, lean protein, and low-fat dairy products. Limit your intake of foods with high amounts of sugar, saturated fats, and salt. Take vitamin and mineral supplements as recommended by your health care provider. Do not drink alcohol if your health care provider tells you not to drink. If you drink alcohol: Limit how much you have to 0-2 drinks a day. Know how much alcohol is in your drink. In the [place] one drink equals one 12 oz bottle of beer (355 mL), one 5 oz glass of wine (148 mL), or one 1 oz glass of hard liquor (44 mL). Lifestyle Brush your teeth every morning and night with fluoride toothpaste. Floss one time each day. Exercise for at least 30 minutes 5 or more days each week. Do not use any products that contain nicotine or tobacco. These products include cigarettes, chewing tobacco, and vaping devices, such as e-cigarettes. If you need help quitting, ask your health care provider. Do not use drugs. If you are sexually active, practice safe sex. Use a condom or other form of protection to prevent STIs. Take aspirin only as told by your health care provider. Make sure that you understand how much to take and what form to take. Work with your health care provider to find out whether it is safe and beneficial for you to take aspirin daily. Ask your health care provider if you need to take a cholesterol-lowering medicine (statin). Find healthy ways to manage stress, such as: Meditation, yoga, or listening to music. Journaling. Talking to a trusted person. Spending time with friends and family. Safety Always wear your seat belt while driving  or riding in a vehicle. Do not drive: If you have been drinking alcohol. Do not ride with someone who has been drinking. When you are tired or distracted. While texting. If you have been using any mind-altering substances or drugs. Wear a helmet and other protective equipment during sports activities. If you have firearms in your house, make sure you follow all gun safety procedures. Minimize exposure to UV radiation to reduce your risk of skin cancer. What's next? Visit your health care provider once a year for an annual wellness visit. Ask your health care provider how often you should have your eyes and teeth checked. Stay up to date on all vaccines. This information is not intended to replace advice given to you by your health care provider. Make sure you discuss any questions you have with your health care provider. Document Revised: 05/22/2021 Document Reviewed: 05/22/2021 Elsevier Patient Education  2024 [employer].

## 2023-09-22 NOTE — Assessment & Plan Note (Signed)
Following with endocrinology.  Doing well status post parathyroidectomy.  Will check labs today.

## 2023-09-22 NOTE — Assessment & Plan Note (Signed)
Symptoms have progressed since our last visit a couple of years ago.  He is currently on propranolol 20 mg twice daily.  He is interested in seeing a neurologist for this.  We did discuss increasing dose of propranolol however he declined.  Will place referral today.

## 2023-09-22 NOTE — Assessment & Plan Note (Signed)
Symptoms are currently very mild.  Is having a few issues with short-term memory.  No word finding difficulties.  No disorientation to person, place, or time.  We will check labs today.  Reassured patient.  He will let us know if he has any worsening symptoms.  We did discuss recommendations for keeping mind and brain active as well.

## 2023-09-22 NOTE — Assessment & Plan Note (Signed)
Follows with GI however previous gastroenterologist retired.  He will be establishing with a new gastroenterologist soon.  Did have a colonoscopy about a year ago or so which was normal.  He has known Lialda 2.4 g daily.  Recently has had a little bit more loose stools and watery diarrhea.  No melena or hematochezia.  Recommended trial of Metamucil to see if this improves his symptoms.  He will discuss further with his gastroenterologist.

## 2023-09-22 NOTE — Assessment & Plan Note (Signed)
Check lipids.  Is on Crestor 20 mg daily.  Discussed lifestyle modifications.

## 2023-09-22 NOTE — Assessment & Plan Note (Signed)
Follows with dermatology for this.  Has a few AK's noted on exam today though will defer further management to dermatology.

## 2023-09-23 ENCOUNTER — Encounter: Payer: Self-pay | Admitting: Neurology

## 2023-09-24 NOTE — Progress Notes (Signed)
Cholesterol is up a little bit since last time we checked.  We probably should increase his dose of Crestor.  Recommend switching to Crestor 40 mg daily.  Please send in prescription if he is agreeable.  He should continue to work on diet and exercise and we can recheck his cholesterol levels in 6 to 12 months.  The rest of his labs are all stable and we can recheck everything else in a year.

## 2023-09-25 ENCOUNTER — Other Ambulatory Visit (HOSPITAL_COMMUNITY): Payer: Self-pay

## 2023-09-25 ENCOUNTER — Other Ambulatory Visit: Payer: Self-pay | Admitting: Family Medicine

## 2023-09-25 DIAGNOSIS — E785 Hyperlipidemia, unspecified: Secondary | ICD-10-CM

## 2023-09-28 ENCOUNTER — Other Ambulatory Visit (HOSPITAL_COMMUNITY): Payer: Self-pay

## 2023-09-28 ENCOUNTER — Other Ambulatory Visit: Payer: Self-pay

## 2023-09-28 MED ORDER — RIVAROXABAN 20 MG PO TABS
20.0000 mg | ORAL_TABLET | Freq: Every day | ORAL | 0 refills | Status: DC
  Filled 2023-09-28: qty 30, 30d supply, fill #0

## 2023-09-28 MED ORDER — ROSUVASTATIN CALCIUM 20 MG PO TABS
20.0000 mg | ORAL_TABLET | Freq: Every day | ORAL | 0 refills | Status: DC
  Filled 2023-09-28: qty 30, 30d supply, fill #0

## 2023-09-29 ENCOUNTER — Other Ambulatory Visit: Payer: Self-pay | Admitting: *Deleted

## 2023-09-29 ENCOUNTER — Other Ambulatory Visit: Payer: Self-pay

## 2023-09-29 ENCOUNTER — Other Ambulatory Visit (HOSPITAL_BASED_OUTPATIENT_CLINIC_OR_DEPARTMENT_OTHER): Payer: Self-pay

## 2023-09-29 MED ORDER — ROSUVASTATIN CALCIUM 40 MG PO TABS
40.0000 mg | ORAL_TABLET | Freq: Every day | ORAL | 3 refills | Status: DC
  Filled 2023-09-29: qty 90, 90d supply, fill #0
  Filled 2023-10-29 – 2023-12-23 (×2): qty 90, 90d supply, fill #1
  Filled 2024-05-26 – 2024-05-31 (×2): qty 90, 90d supply, fill #2
  Filled 2024-08-30: qty 90, 90d supply, fill #3

## 2023-09-29 NOTE — Progress Notes (Unsigned)
Assessment/Plan:   1.  Tremor  -Likely essential, but cannot rule out component of alcohol.  He appears to have a family history of this, but just met his half-sister who sounds like she will be undergoing focused ultrasound.  He has not known her most of his life.  We discussed that some medications can worsen this, as can caffeine use.  We discussed medication therapy as well as surgical therapy.  He is already on propranolol.  His pulse is already low, so I do not feel comfortable raising the dosage of this medication.  He cannot be on primidone because of interaction with Xarelto.  We subsequently discussed second line medications and exactly what those were and why they were second line.  Right now, he does not want to try those.  -Long discussion with the patient regarding the complex effect that alcohol can have on tremor.  Chronic alcohol use can produce a tremor but discontinuation of the alcohol can also cause a tremulous state for quite some time.  We talked about the importance of weaning alcohol under medical supervision.  We talked about the fact that rapid discontinuation can cause withdrawal seizure, which is why medical supervision is of the upmost importance.   -We discussed surgical interventions, including deep brain stimulation and focused ultrasound.  Discussed that we likely would not do these without discontinuation of alcohol.  Discussed that focused ultrasound may be preferable for him, primarily because we will have to get him off the Xarelto for so long.  Information was given to him to read about.   2.  Bilateral pulmonary embolism  -On Xarelto  3.  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  Subjective:   Shaun Armstrong was seen today in the movement disorders clinic for neurologic consultation at the request of  Ardith Dark, MD.  The consultation is for the evaluation of tremor.  Primary care notes are reviewed.  Patient has been discussing tremor with his primary care physician at least since 2019 and has been on propranolol for this.  He thinks that he has had tremor for at least 10 years ago.    Tremor: Yes.     How long has it been going on? 10 years  At rest or with activation?  activation  When is it noted the most?  Seems to come and go  Fam hx of tremor?  Yes.  , just learned of a 1/2 sister has tremor   Located where?  Bilateral UE  Affected by caffeine:  No. (2 cups coffee/day  Affected by alcohol:  No. (drinks 2 beers/day - some days more, some less)  Affected by stress:  maybe  Affected by fatigue:  No.  Spills soup if on spoon:  No.  Spills glass of liquid if full:  No.  Tremor inducing meds:  propranolol 40 mg daily  Other Specific Symptoms:  Voice: no change Postural symptoms:  No.  Falls?  No. Loss of smell:  No. Loss of taste:  No. Urinary Incontinence:  No. Difficulty Swallowing:  No. N/V:  No. Lightheaded:  rarely  Syncope: No. Diplopia:  No., but some blurry vision  Patient had CT brain in 2019 that demonstrated a prior right suboccipital craniectomy with associated encephalomalacia in the right cerebellar hemisphere.    ALLERGIES:   Allergies  Allergen  Reactions   Cefuroxime Other (See Comments)    Severe dehydration, not certain if this is true     Poison Ivy Extract Rash    CURRENT MEDICATIONS:  Current Outpatient Medications  Medication Instructions   Cholecalciferol (VITAMIN D) 125 MCG (5000 UT) CAPS 1 capsule, Oral, Weekly   COVID-19 mRNA vaccine, Pfizer, (COMIRNATY) syringe 0.3 mLs, Intramuscular,  Once   Fish Oil 2,000 mg, Oral, Daily   glucosamine-chondroitin 500-400 MG tablet 1 tablet, Oral, Daily, With msm   mesalamine (LIALDA) 1.2 g EC tablet Take 2 tablets by mouth once a day   Multiple Vitamin (MULTIVITAMIN) tablet 1 tablet, Oral, Daily    Polyethyl Glycol-Propyl Glycol (SYSTANE FREE OP) 2 drops, Both Eyes, 5 times daily PRN   propranolol (INDERAL) 40 mg, Oral, Daily   rosuvastatin (CRESTOR) 40 mg, Oral, Daily   Turmeric 500 MG TABS Oral   Xarelto 20 mg, Oral, Daily with supper    Objective:   PHYSICAL EXAMINATION:    VITALS:   Vitals:   10/01/23 1317  BP: 132/86  Pulse: 61  SpO2: 98%  Weight: 199 lb (90.3 kg)  Height: 5\' 9"  (1.753 m)    GEN:  The patient appears stated age and is in NAD. HEENT:  Normocephalic, atraumatic.  The mucous membranes are moist. The superficial temporal arteries are without ropiness or tenderness. CV:  RRR Lungs:  CTAB Neck/HEME:  There are no carotid bruits bilaterally.  Neurological examination:  Orientation: The patient is alert and oriented x3.  Cranial nerves: There is R facial droop (due to removal of right acoustic neuroma).    Extraocular muscles are intact. The visual fields are full to confrontational testing. The speech is fluent and clear. Soft palate rises symmetrically and there is no tongue deviation. Hearing is intact to conversational tone. Sensation: Sensation is intact to light touch throughout (facial, trunk, extremities). Vibration is intact at the bilateral big toe, albeit slightly decreased. There is no extinction with double simultaneous stimulation.  Motor: Strength is 5/5 in the bilateral upper and lower extremities.   Shoulder shrug is equal and symmetric.  There is no pronator drift. Deep tendon reflexes: Deep tendon reflexes are 2/4 at the bilateral biceps, triceps, brachioradialis, patella and achilles. Plantar responses are downgoing bilaterally.  Movement examination: Tone: There is normal tone in the bilateral upper extremities.  The tone in the lower extremities is normal.  Abnormal movements: There is no rest tremor.  There is minimal postural tremor.  There is mild intention tremor.  It does not particularly change when given a weight, but is mild to  moderate perhaps.  He has mild trouble with Archimedes spirals bilaterally.  The most significant issue is when he tries to pour water from 1 glass to another, and he has significant trouble when the full glass is in the right hand and spills quite a bit of the water.     Coordination:  There is no decremation with RAM's, with any form of RAMS, including alternating supination and pronation of the forearm, hand opening and closing, finger taps, heel taps and toe taps.  Gait and Station: The patient has no difficulty arising out of a deep-seated chair without the use of the hands. The patient's stride length is good.   I have reviewed and interpreted the following labs independently   Chemistry      Component Value Date/Time   NA 140 09/22/2023 1014   NA 143 07/01/2016 0000   K 4.2 09/22/2023 1014  CL 104 09/22/2023 1014   CO2 28 09/22/2023 1014   BUN 18 09/22/2023 1014   CREATININE 0.91 09/22/2023 1014   CREATININE 0.99 10/16/2020 0959      Component Value Date/Time   CALCIUM 9.6 09/22/2023 1014   ALKPHOS 61 09/22/2023 1014   AST 27 09/22/2023 1014   ALT 24 09/22/2023 1014   BILITOT 0.7 09/22/2023 1014      Lab Results  Component Value Date   TSH 3.46 09/22/2023   Lab Results  Component Value Date   WBC 7.2 09/22/2023   HGB 16.0 09/22/2023   HCT 48.7 09/22/2023   MCV 92.7 09/22/2023   PLT 202.0 09/22/2023      Total time spent on today's visit was 60 minutes, including both face-to-face time and nonface-to-face time.  Time included that spent on review of records (prior notes available to me/labs/imaging if pertinent), discussing treatment and goals, answering patient's questions and coordinating care.  Cc:  Ardith Dark, MD

## 2023-09-30 ENCOUNTER — Other Ambulatory Visit (HOSPITAL_BASED_OUTPATIENT_CLINIC_OR_DEPARTMENT_OTHER): Payer: Self-pay

## 2023-09-30 MED ORDER — COVID-19 MRNA VAC-TRIS(PFIZER) 30 MCG/0.3ML IM SUSY
0.3000 mL | PREFILLED_SYRINGE | Freq: Once | INTRAMUSCULAR | 0 refills | Status: AC
  Filled 2023-09-30: qty 0.3, 1d supply, fill #0

## 2023-10-01 ENCOUNTER — Ambulatory Visit: Payer: PPO | Admitting: Neurology

## 2023-10-01 ENCOUNTER — Encounter: Payer: Self-pay | Admitting: Neurology

## 2023-10-01 VITALS — BP 132/86 | HR 61 | Ht 69.0 in | Wt 199.0 lb

## 2023-10-01 DIAGNOSIS — F1090 Alcohol use, unspecified, uncomplicated: Secondary | ICD-10-CM

## 2023-10-01 DIAGNOSIS — Z789 Other specified health status: Secondary | ICD-10-CM | POA: Diagnosis not present

## 2023-10-01 DIAGNOSIS — G250 Essential tremor: Secondary | ICD-10-CM | POA: Diagnosis not present

## 2023-10-01 NOTE — Patient Instructions (Signed)
Good to see you today! We discussed options for tremor, including medical and surgical.  We discussed weaning alcohol with Dr Lavone Neri guidance.  The physicians and staff at [facility] are committed to providing excellent care. You may receive a survey requesting feedback about your experience at our office. We strive to receive "very good" responses to the survey questions. If you feel that your experience would prevent you from giving the office a "very good " response, please contact our office to try to remedy the situation. We may be reached at [phone]. Thank you for taking the time out of your busy day to complete the survey.

## 2023-10-07 ENCOUNTER — Other Ambulatory Visit: Payer: Self-pay

## 2023-10-08 ENCOUNTER — Other Ambulatory Visit: Payer: Self-pay

## 2023-10-08 ENCOUNTER — Other Ambulatory Visit (HOSPITAL_COMMUNITY): Payer: Self-pay

## 2023-10-08 MED ORDER — MESALAMINE 1.2 G PO TBEC
2.4000 g | DELAYED_RELEASE_TABLET | Freq: Every day | ORAL | 5 refills | Status: DC
  Filled 2023-10-08: qty 120, 60d supply, fill #0
  Filled 2023-10-29 – 2023-12-23 (×2): qty 120, 60d supply, fill #1
  Filled 2024-03-18: qty 120, 60d supply, fill #2
  Filled 2024-03-18 – 2024-05-31 (×3): qty 120, 60d supply, fill #3
  Filled 2024-08-29: qty 120, 60d supply, fill #4

## 2023-10-14 ENCOUNTER — Ambulatory Visit: Payer: PPO | Admitting: Neurology

## 2023-10-27 DIAGNOSIS — K5010 Crohn's disease of large intestine without complications: Secondary | ICD-10-CM | POA: Diagnosis not present

## 2023-10-29 ENCOUNTER — Encounter (HOSPITAL_COMMUNITY): Payer: Self-pay

## 2023-10-29 ENCOUNTER — Other Ambulatory Visit: Payer: Self-pay

## 2023-10-29 ENCOUNTER — Other Ambulatory Visit: Payer: Self-pay | Admitting: Family Medicine

## 2023-10-29 ENCOUNTER — Other Ambulatory Visit (HOSPITAL_COMMUNITY): Payer: Self-pay

## 2023-10-29 MED ORDER — RIVAROXABAN 20 MG PO TABS
20.0000 mg | ORAL_TABLET | Freq: Every day | ORAL | 0 refills | Status: DC
  Filled 2023-10-29 – 2023-12-23 (×2): qty 30, 30d supply, fill #0

## 2023-11-07 ENCOUNTER — Other Ambulatory Visit (HOSPITAL_COMMUNITY): Payer: Self-pay

## 2023-11-13 ENCOUNTER — Other Ambulatory Visit: Payer: Self-pay

## 2023-12-23 ENCOUNTER — Other Ambulatory Visit: Payer: Self-pay

## 2024-01-01 DIAGNOSIS — D333 Benign neoplasm of cranial nerves: Secondary | ICD-10-CM | POA: Diagnosis not present

## 2024-01-01 DIAGNOSIS — H7201 Central perforation of tympanic membrane, right ear: Secondary | ICD-10-CM | POA: Diagnosis not present

## 2024-01-01 DIAGNOSIS — H6123 Impacted cerumen, bilateral: Secondary | ICD-10-CM | POA: Diagnosis not present

## 2024-01-01 DIAGNOSIS — Z01118 Encounter for examination of ears and hearing with other abnormal findings: Secondary | ICD-10-CM | POA: Diagnosis not present

## 2024-01-01 DIAGNOSIS — H9319 Tinnitus, unspecified ear: Secondary | ICD-10-CM | POA: Diagnosis not present

## 2024-01-01 DIAGNOSIS — H903 Sensorineural hearing loss, bilateral: Secondary | ICD-10-CM | POA: Diagnosis not present

## 2024-01-01 DIAGNOSIS — R2689 Other abnormalities of gait and mobility: Secondary | ICD-10-CM | POA: Diagnosis not present

## 2024-01-13 ENCOUNTER — Ambulatory Visit
Admission: EM | Admit: 2024-01-13 | Discharge: 2024-01-13 | Disposition: A | Discharge status: Home / Self Care | Payer: PPO | Attending: Family Medicine | Admitting: Family Medicine

## 2024-01-13 DIAGNOSIS — M5030 Other cervical disc degeneration, unspecified cervical region: Secondary | ICD-10-CM | POA: Diagnosis not present

## 2024-01-13 DIAGNOSIS — M436 Torticollis: Secondary | ICD-10-CM | POA: Diagnosis not present

## 2024-01-13 MED ORDER — CYCLOBENZAPRINE HCL 5 MG PO TABS: 5.0000 mg | ORAL_TABLET | Freq: Every evening | ORAL | 0 refills | Status: DC | PRN

## 2024-01-13 MED ORDER — PREDNISONE 20 MG PO TABS: ORAL_TABLET | ORAL | 0 refills | Status: DC

## 2024-01-13 NOTE — ED Provider Notes (Signed)
 [facility]  Note:  This document was prepared using Conservation officer, historic buildings and may include unintentional dictation errors.  MRN: [id number] DOB: [birth year]  Subjective:   Shaun Armstrong is a 73 y.o. male presenting for 3-day history of acute onset persistent neck pain, decreased range of motion.  No fever, trauma, radicular symptoms.  No falls.  Has a history of degenerative disc disease at C5-C6.  Has used ibuprofen without relief.  Tylenol  has helped some but very temporarily.  No current facility-administered medications for this encounter.  Current Outpatient Medications:    Cholecalciferol (VITAMIN D ) 125 MCG (5000 UT) CAPS, Take 1 capsule by mouth once a week., Disp: , Rfl:    glucosamine-chondroitin 500-400 MG tablet, Take 1 tablet by mouth daily. With msm, Disp: , Rfl:    mesalamine  (LIALDA ) 1.2 g EC tablet, Take 2 tablets (2.4 g total) by mouth daily., Disp: 120 tablet, Rfl: 5   Multiple Vitamin (MULTIVITAMIN) tablet, Take 1 tablet by mouth daily., Disp: , Rfl:    Omega-3 Fatty Acids (FISH OIL) 1000 MG CAPS, Take 2,000 mg by mouth daily., Disp: , Rfl:    Polyethyl Glycol-Propyl Glycol (SYSTANE FREE OP), Place 2 drops into both eyes 5 (five) times daily as needed (dry eyes)., Disp: , Rfl:    propranolol  (INDERAL ) 20 MG tablet, Take 2 tablets (40 mg total) by mouth daily., Disp: 180 tablet, Rfl: 3   rivaroxaban  (XARELTO ) 20 MG TABS tablet, Take 1 tablet (20 mg total) by mouth daily with supper., Disp: 30 tablet, Rfl: 0   rosuvastatin  (CRESTOR ) 40 MG tablet, Take 1 tablet (40 mg total) by mouth daily., Disp: 90 tablet, Rfl: 3   Turmeric 500 MG TABS, Take by mouth., Disp: , Rfl:    Allergies  Allergen Reactions   Cefuroxime Other (See Comments)    Severe dehydration, not certain if this is true     Poison Ivy Extract Rash    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   Crohn's disease (HCC)    Deafness in right  ear, wears a microphone    Decreased peripheral vision 11/04/2011   Essential tremor    HLD (hyperlipidemia)    Inguinal hernia 07/2016   Retinal detachment    Wears hearing aid, left ear      Past Surgical History:  Procedure Laterality Date   ACOUSTIC NEUROMA RESECTION Right    BROW LIFT AND BLEPHAROPLASTY Bilateral 10/11/2012   CATARACT EXTRACTION W/ INTRAOCULAR LENS  IMPLANT, BILATERAL Bilateral    HERNIA REPAIR     INGUINAL HERNIA REPAIR Right 07/16/2016   Procedure: LAPAROSCOPIC RIGHT INGUINAL HERNIA;  Surgeon: Vicenta Poli, MD;  Location: [facility];  Service: General;  Laterality: Right;   INSERTION OF MESH Right 07/16/2016   Procedure: INSERTION OF MESH;  Surgeon: Vicenta Poli, MD;  Location: [facility];  Service: General;  Laterality: Right;   MELANOMA EXCISION  01/2022   scalp   MOHS SURGERY N/A 11/05/2021   scalp   PARATHYROIDECTOMY Left 05/07/2022   Procedure: MINIMALLY INVASIVE LEFT INFERIOR PARATHYROIDECTOMY;  Surgeon: Rubin Calamity, MD;  Location: [facility] OR;  Service: General;  Laterality: Left;   RETINAL DETACHMENT SURGERY Left    TONSILLECTOMY      Family History  Problem Relation Age of Onset   Diabetes Mother    Dementia Mother    Tremor Sister    Diabetes Sister    Tremor Half-Sister  Tremor Half-Sister    Hyperparathyroidism Neg Hx     Social History   Tobacco Use   Smoking status: Former    Current packs/day: 0.00    Average packs/day: 1 pack/day for 4.0 years (4.0 ttl pk-yrs)    Types: Cigarettes    Start date: 12/09/1975    Quit date: 12/09/1979    Years since quitting: 44.1   Smokeless tobacco: Never   Tobacco comments:    Quit 38 years ago  Vaping Use   Vaping status: Some Days  Substance Use Topics   Alcohol use: Yes    Alcohol/week: 14.0 standard drinks of alcohol    Types: 14 Cans of beer per week   Drug use: No    ROS   Objective:   Vitals: There were no vitals taken for this  visit.  Physical Exam Constitutional:      General: He is not in acute distress.    Appearance: Normal appearance. He is well-developed and normal weight. He is not ill-appearing, toxic-appearing or diaphoretic.  HENT:     Head: Normocephalic and atraumatic.     Right Ear: External ear normal.     Left Ear: External ear normal.     Nose: Nose normal.     Mouth/Throat:     Pharynx: Oropharynx is clear.  Eyes:     General: No scleral icterus.       Right eye: No discharge.        Left eye: No discharge.     Extraocular Movements: Extraocular movements intact.  Cardiovascular:     Rate and Rhythm: Normal rate.  Pulmonary:     Effort: Pulmonary effort is normal.  Musculoskeletal:     Cervical back: Spasms and torticollis present. No swelling, edema, deformity, erythema, signs of trauma, lacerations, rigidity, tenderness, bony tenderness or crepitus. No pain with movement. Decreased range of motion (In all directions).  Neurological:     Mental Status: He is alert and oriented to person, place, and time.  Psychiatric:        Mood and Affect: Mood normal.        Behavior: Behavior normal.        Thought Content: Thought content normal.        Judgment: Judgment normal.     Assessment and Plan :   PDMP not reviewed this encounter.  1. Torticollis   2. Degenerative disc disease, cervical    Deferred imaging.  Recommended prednisone , muscle relaxant.  Counseled patient on potential for adverse effects with medications prescribed/recommended today, ER and return-to-clinic precautions discussed, patient verbalized understanding.    Christopher Savannah, [place] 01/13/24 [id number]

## 2024-01-13 NOTE — ED Triage Notes (Addendum)
Pt c/o neck pain and unable to open mouth as wide as normal x 2-3 days-denies injury/fever-states it is difficult to turn head/increase in pain-ibuprofen no relief/some relief with tylenol-NAD-steady slow gait

## 2024-01-14 ENCOUNTER — Telehealth: Payer: Self-pay

## 2024-01-15 ENCOUNTER — Ambulatory Visit: Payer: PPO | Admitting: Family Medicine

## 2024-02-08 ENCOUNTER — Other Ambulatory Visit: Payer: Self-pay

## 2024-02-08 ENCOUNTER — Other Ambulatory Visit (HOSPITAL_COMMUNITY): Payer: Self-pay

## 2024-02-11 ENCOUNTER — Ambulatory Visit: Payer: PPO

## 2024-02-11 VITALS — BP 120/80 | HR 58 | Temp 98.2°F | Ht 69.0 in | Wt 194.2 lb

## 2024-02-11 DIAGNOSIS — Z0000 Encounter for general adult medical examination without abnormal findings: Secondary | ICD-10-CM | POA: Diagnosis not present

## 2024-02-11 NOTE — Patient Instructions (Signed)
 Mr. Shaun Armstrong , Thank you for taking time to come for your Medicare Wellness Visit. I appreciate your ongoing commitment to your health goals. Please review the following plan we discussed and let me know if I can assist you in the future.   Referrals/Orders/Follow-Ups/Clinician Recommendations: Aim for 30 minutes of exercise or brisk walking, 6-8 glasses of water, and 5 servings of fruits and vegetables each day. And continue to increase walking   This is a list of the screening recommended for you and due dates:  Health Maintenance  Topic Date Due   Zoster (Shingles) Vaccine (1 of 2) Never done   Pneumonia Vaccine (1 of 1 - PCV) Never done   COVID-19 Vaccine (8 - 2024-25 season) 11/25/2023   Medicare Annual Wellness Visit  02/03/2024   Colon Cancer Screening  06/18/2024   DTaP/Tdap/Td vaccine (4 - Td or Tdap) 04/02/2031   Flu Shot  Completed   Hepatitis C Screening  Completed   HPV Vaccine  Aged Out    Advanced directives: (Copy Requested) Please bring a copy of your health care power of attorney and living will to the office to be added to your chart at your convenience.  Next Medicare Annual Wellness Visit scheduled for next year: Yes

## 2024-02-11 NOTE — Progress Notes (Signed)
 Subjective:   Shaun Armstrong is a 73 y.o. who presents for a Medicare Wellness preventive visit.  Visit Complete: In person  VideoDeclined- This patient declined Interactive audio and Acupuncturist. Therefore the visit was completed with audio only.  AWV Questionnaire: No: Patient Medicare AWV questionnaire was not completed prior to this visit.  Cardiac Risk Factors include: advanced age (>53men, >78 women);dyslipidemia;male gender     Objective:    Today's Vitals   02/11/24 0949  BP: (!) 142/80  Pulse: (!) 58  Temp: 98.2 F (36.8 C)  SpO2: 96%  Weight: 194 lb 3.2 oz (88.1 kg)  Height: 5\' 9"  (1.753 m)   Body mass index is 28.68 kg/m.     02/11/2024    9:59 AM 10/01/2023    1:18 PM 02/02/2023    8:51 AM 04/29/2022   10:10 AM 01/20/2022    8:37 AM 01/14/2021    9:01 AM 10/04/2020    8:07 PM  Advanced Directives  Does Patient Have a Medical Advance Directive? Yes Yes Yes Yes Yes Yes Yes  Type of Estate agent of [place];Living will Living will Healthcare Power of [place];Living will Healthcare Power of [employer] Power of [employer] Power of [place];Living will Healthcare Power of [place];Living will  Does patient want to make changes to medical advance directive?    No - Patient declined     Copy of Healthcare Power of Attorney in Chart? No - copy requested  No - copy requested No - copy requested No - copy requested No - copy requested     Current Medications (verified) Outpatient Encounter Medications as of 02/11/2024  Medication Sig   Cholecalciferol (VITAMIN D) 125 MCG (5000 UT) CAPS Take 1 capsule by mouth once a week.   glucosamine-chondroitin 500-400 MG tablet Take 1 tablet by mouth daily. With msm   mesalamine (LIALDA) 1.2 g EC tablet Take 2 tablets (2.4 g total) by mouth daily.   Multiple Vitamin (MULTIVITAMIN) tablet Take 1 tablet by mouth daily.   Omega-3 Fatty Acids (FISH OIL) 1000 MG CAPS Take 2,000 mg by  mouth daily.   Polyethyl Glycol-Propyl Glycol (SYSTANE FREE OP) Place 2 drops into both eyes 5 (five) times daily as needed (dry eyes).   propranolol (INDERAL) 20 MG tablet Take 2 tablets (40 mg total) by mouth daily.   rivaroxaban (XARELTO) 20 MG TABS tablet Take 1 tablet (20 mg total) by mouth daily with supper.   rosuvastatin (CRESTOR) 40 MG tablet Take 1 tablet (40 mg total) by mouth daily.   Turmeric 500 MG TABS Take by mouth.   predniSONE (DELTASONE) 20 MG tablet Take 2 tablets daily with breakfast. (Patient not taking: Reported on 02/11/2024)   [DISCONTINUED] cyclobenzaprine (FLEXERIL) 5 MG tablet Take 1 tablet (5 mg total) by mouth at bedtime as needed.   No facility-administered encounter medications on file as of 02/11/2024.    Allergies (verified) Cefuroxime and Poison ivy extract   History: 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   Crohn's disease (HCC)    Deafness in right ear, wears a microphone    Decreased peripheral vision 11/04/2011   Essential tremor    HLD (hyperlipidemia)    Inguinal hernia 07/2016   Retinal detachment    Wears hearing aid, left ear    Past Surgical History:  Procedure Laterality Date   ACOUSTIC NEUROMA RESECTION Right    BROW LIFT AND BLEPHAROPLASTY Bilateral 10/11/2012   CATARACT EXTRACTION W/  INTRAOCULAR LENS  IMPLANT, BILATERAL Bilateral    HERNIA REPAIR     INGUINAL HERNIA REPAIR Right 07/16/2016   Procedure: LAPAROSCOPIC RIGHT INGUINAL HERNIA;  Surgeon: Abigail Miyamoto, MD;  Location: [facility];  Service: General;  Laterality: Right;   INSERTION OF MESH Right 07/16/2016   Procedure: INSERTION OF MESH;  Surgeon: Abigail Miyamoto, MD;  Location: [facility];  Service: General;  Laterality: Right;   MELANOMA EXCISION  01/2022   scalp   MOHS SURGERY N/A 11/05/2021   scalp   PARATHYROIDECTOMY Left 05/07/2022   Procedure: MINIMALLY INVASIVE LEFT INFERIOR PARATHYROIDECTOMY;  Surgeon:  Axel Filler, MD;  Location: [facility] OR;  Service: General;  Laterality: Left;   RETINAL DETACHMENT SURGERY Left    TONSILLECTOMY     Family History  Problem Relation Age of Onset   Diabetes Mother    Dementia Mother    Tremor Sister    Diabetes Sister    Tremor Half-Sister    Tremor Half-Sister    Hyperparathyroidism Neg Hx    Social History   Socioeconomic History   Marital status: Married    Spouse name: Shaun Armstrong   Number of children: 1   Years of education: Not on file   Highest education level: Not on file  Occupational History   Occupation: retired    Comment: Soil scientist  Tobacco Use   Smoking status: Former    Current packs/day: 0.00    Average packs/day: 1 pack/day for 4.0 years (4.0 ttl pk-yrs)    Types: Cigarettes    Start date: 12/09/1975    Quit date: 12/09/1979    Years since quitting: 44.2   Smokeless tobacco: Never   Tobacco comments:    Quit 38 years ago  Vaping Use   Vaping status: Some Days  Substance and Sexual Activity   Alcohol use: Yes    Alcohol/week: 14.0 standard drinks of alcohol    Types: 14 Cans of beer per week   Drug use: No   Sexual activity: Yes  Other Topics Concern   Not on file  Social History Narrative   Right handed    Social Drivers of Health   Financial Resource Strain: Low Risk  (02/11/2024)   Overall Financial Resource Strain (CARDIA)    Difficulty of Paying Living Expenses: Not hard at all  Food Insecurity: No Food Insecurity (02/11/2024)   Hunger Vital Sign    Worried About Running Out of Food in the Last Year: Never true    Ran Out of Food in the Last Year: Never true  Transportation Needs: No Transportation Needs (02/11/2024)   PRAPARE - Administrator, Civil Service (Medical): No    Lack of Transportation (Non-Medical): No  Physical Activity: Insufficiently Active (02/11/2024)   Exercise Vital Sign    Days of Exercise per Week: 6 days    Minutes of Exercise per Session: 20 min  Stress: No Stress Concern  Present (02/11/2024)   [employer] of Occupational Health - Occupational Stress Questionnaire    Feeling of Stress : Only a little  Social Connections: Socially Integrated (02/11/2024)   Social Connection and Isolation Panel [NHANES]    Frequency of Communication with Friends and Family: More than three times a week    Frequency of Social Gatherings with Friends and Family: More than three times a week    Attends Religious Services: 1 to 4 times per year    Active Member of [employer] or Organizations: Yes  Attends Banker Meetings: 1 to 4 times per year    Marital Status: Married    Tobacco Counseling Counseling given: Not Answered Tobacco comments: Quit 38 years ago    Clinical Intake:  Pre-visit preparation completed: Yes  Pain : No/denies pain     BMI - recorded: 28.68 Nutritional Status: BMI 25 -29 Overweight Nutritional Risks: None Diabetes: No  How often do you need to have someone help you when you read instructions, pamphlets, or other written materials from your doctor or pharmacy?: 1 - Never  Interpreter Needed?: No  Information entered by :: Lanier Ensign, LPN   Activities of Daily Living     02/11/2024    9:55 AM  In your present state of health, do you have any difficulty performing the following activities:  Hearing? 1  Comment has hearing aids  Vision? 0  Difficulty concentrating or making decisions? 0  Walking or climbing stairs? 0  Dressing or bathing? 0  Doing errands, shopping? 0  Preparing Food and eating ? N  Using the Toilet? N  In the past six months, have you accidently leaked urine? N  Do you have problems with loss of bowel control? N  Managing your Medications? N  Managing your Finances? N  Housekeeping or managing your Housekeeping? N    Patient Care Team: Ardith Dark, MD as PCP - General (Family Medicine) Bernette Redbird, MD as Consulting Physician (Gastroenterology) Bonna Gains as Consulting Physician  (Dentistry) Janet Berlin, MD as Consulting Physician (Ophthalmology) Stormy Fabian as Consulting Physician (Ophthalmology) Enid Derry, MD as Consulting Physician  Indicate any recent Medical Services you may have received from other than [facility] providers in the past year (date may be approximate).     Assessment:   This is a routine wellness examination for Shaun Armstrong.  Hearing/Vision screen Hearing Screening - Comments:: Pt wears hearing aids  Vision Screening - Comments:: Pt follows up with Dr Burgess Estelle for annual eye exams    Goals Addressed             This Visit's Progress    Patient Stated       Walk more        Depression Screen     02/11/2024   10:00 AM 09/22/2023    9:14 AM 02/02/2023    8:44 AM 06/12/2022    3:03 PM 01/20/2022    8:36 AM 01/14/2021    8:59 AM 11/23/2019    9:33 AM  PHQ 2/9 Scores  PHQ - 2 Score 1 0 1 0 0 0 0    Fall Risk     02/11/2024   10:02 AM 10/01/2023    1:18 PM 09/22/2023    9:14 AM 02/02/2023    8:47 AM 06/12/2022    3:03 PM  Fall Risk   Falls in the past year? 1 0 0 1 0  Number falls in past yr: 1 0 0 1 0  Injury with Fall? 0 0 0 0 0  Risk for fall due to : History of fall(s)  No Fall Risks Impaired vision;Impaired balance/gait No Fall Risks  Follow up Falls prevention discussed Falls evaluation completed  Falls prevention discussed     MEDICARE RISK AT HOME:  Medicare Risk at Home Any stairs in or around the home?: Yes If so, are there any without handrails?: No Home free of loose throw rugs in walkways, pet beds, electrical cords, etc?: Yes Adequate lighting in your home to reduce risk of falls?: Yes  Life alert?: No Use of a cane, walker or w/c?: No Grab bars in the bathroom?: Yes Shower chair or bench in shower?: Yes Elevated toilet seat or a handicapped toilet?: No  TIMED UP AND GO:  Was the test performed?  Yes  Length of time to ambulate 10 feet: 10 sec Gait slow and steady without use of assistive device  Cognitive  Function: 6CIT completed    07/22/2018    9:51 AM  MMSE - Mini Mental State Exam  Not completed: --        02/11/2024   10:03 AM 02/02/2023    8:49 AM 01/20/2022    8:41 AM 01/14/2021    9:05 AM  6CIT Screen  What Year? 0 points 0 points 0 points 0 points  What month? 0 points 0 points 0 points 0 points  What time? 0 points 0 points 0 points   Count back from 20 0 points 0 points 0 points 0 points  Months in reverse 0 points 0 points 0 points 0 points  Repeat phrase 0 points 0 points 0 points 0 points  Total Score 0 points 0 points 0 points     Immunizations Immunization History  Administered Date(s) Administered   Fluad Quad(high Dose 65+) 08/02/2019, 09/10/2021, 10/21/2022   Fluad Trivalent(High Dose 65+) 09/22/2023   Hep A / Hep B 03/03/2013   Hepatitis A 02/25/2013, 03/03/2013, 03/10/2013, 03/09/2014   Hepatitis B 02/25/2013, 03/03/2013, 03/10/2013, 03/09/2014   Influenza Split 09/18/2009   Influenza, High Dose Seasonal PF 08/30/2018   Influenza-Unspecified 08/22/2010, 09/09/2011, 09/25/2012, 09/18/2014, 09/30/2017, 08/30/2018, 08/03/2019, 09/03/2020   Moderna SARS-COV2 Booster Vaccination 10/29/2020   PFIZER Comirnaty(Gray Top)Covid-19 Tri-Sucrose Vaccine 06/03/2021   PFIZER(Purple Top)SARS-COV-2 Vaccination 01/02/2020, 01/09/2020, 01/23/2020   Pfizer Covid-19 Vaccine Bivalent Booster 32yrs & up 02/28/2022   Pfizer(Comirnaty)Fall Seasonal Vaccine 12 years and older 10/21/2022, 09/30/2023   Respiratory Syncytial Virus Vaccine,Recomb Aduvanted(Arexvy) 11/17/2022   Td 06/22/2002   Tdap 11/12/2009, 04/01/2021    Screening Tests Health Maintenance  Topic Date Due   Zoster Vaccines- Shingrix (1 of 2) Never done   Pneumonia Vaccine 43+ Years old (1 of 1 - PCV) Never done   COVID-19 Vaccine (8 - 2024-25 season) 11/25/2023   Colonoscopy  06/18/2024   Medicare Annual Wellness (AWV)  02/10/2025   DTaP/Tdap/Td (4 - Td or Tdap) 04/02/2031   INFLUENZA VACCINE  Completed    Hepatitis C Screening  Completed   HPV VACCINES  Aged Out    Health Maintenance  Health Maintenance Due  Topic Date Due   Zoster Vaccines- Shingrix (1 of 2) Never done   Pneumonia Vaccine 65+ Years old (1 of 1 - PCV) Never done   COVID-19 Vaccine (8 - 2024-25 season) 11/25/2023   Health Maintenance Items Addressed: See Nurse Notes  Additional Screening:  Vision Screening: Recommended annual ophthalmology exams for early detection of glaucoma and other disorders of the eye.  Dental Screening: Recommended annual dental exams for proper oral hygiene  Community Resource Referral / Chronic Care Management: CRR required this visit?  No   CCM required this visit?  No     Plan:     I have personally reviewed and noted the following in the patient's chart:   Medical and social history Use of alcohol, tobacco or illicit drugs  Current medications and supplements including opioid prescriptions. Patient is not currently taking opioid prescriptions. Functional ability and status Nutritional status Physical activity Advanced directives List of other physicians Hospitalizations, surgeries, and ER visits  in previous 12 months Vitals Screenings to include cognitive, depression, and falls Referrals and appointments  In addition, I have reviewed and discussed with patient certain preventive protocols, quality metrics, and best practice recommendations. A written personalized care plan for preventive services as well as general preventive health recommendations were provided to patient.     Marzella Schlein, LPN   12/13/1094   After Visit Summary: (In Person-Printed) AVS printed and given to the patient  Notes: Nothing significant to report at this time.

## 2024-03-01 DIAGNOSIS — H0100A Unspecified blepharitis right eye, upper and lower eyelids: Secondary | ICD-10-CM | POA: Diagnosis not present

## 2024-03-01 DIAGNOSIS — Z961 Presence of intraocular lens: Secondary | ICD-10-CM | POA: Diagnosis not present

## 2024-03-01 DIAGNOSIS — H43813 Vitreous degeneration, bilateral: Secondary | ICD-10-CM | POA: Diagnosis not present

## 2024-03-01 DIAGNOSIS — H353122 Nonexudative age-related macular degeneration, left eye, intermediate dry stage: Secondary | ICD-10-CM | POA: Diagnosis not present

## 2024-03-01 DIAGNOSIS — H35373 Puckering of macula, bilateral: Secondary | ICD-10-CM | POA: Diagnosis not present

## 2024-03-01 DIAGNOSIS — H04123 Dry eye syndrome of bilateral lacrimal glands: Secondary | ICD-10-CM | POA: Diagnosis not present

## 2024-03-01 DIAGNOSIS — H0100B Unspecified blepharitis left eye, upper and lower eyelids: Secondary | ICD-10-CM | POA: Diagnosis not present

## 2024-03-02 ENCOUNTER — Ambulatory Visit (INDEPENDENT_AMBULATORY_CARE_PROVIDER_SITE_OTHER): Admitting: Family Medicine

## 2024-03-02 ENCOUNTER — Encounter: Payer: Self-pay | Admitting: Family Medicine

## 2024-03-02 VITALS — BP 112/75 | HR 56 | Temp 97.2°F | Ht 69.0 in | Wt 183.8 lb

## 2024-03-02 DIAGNOSIS — R42 Dizziness and giddiness: Secondary | ICD-10-CM

## 2024-03-02 DIAGNOSIS — G250 Essential tremor: Secondary | ICD-10-CM | POA: Diagnosis not present

## 2024-03-02 DIAGNOSIS — K5080 Crohn's disease of both small and large intestine without complications: Secondary | ICD-10-CM | POA: Diagnosis not present

## 2024-03-02 LAB — COMPREHENSIVE METABOLIC PANEL WITH GFR
ALT: 28 U/L (ref 0–53)
AST: 31 U/L (ref 0–37)
Albumin: 4.2 g/dL (ref 3.5–5.2)
Alkaline Phosphatase: 55 U/L (ref 39–117)
BUN: 12 mg/dL (ref 6–23)
CO2: 30 meq/L (ref 19–32)
Calcium: 9.4 mg/dL (ref 8.4–10.5)
Chloride: 105 meq/L (ref 96–112)
Creatinine, Ser: 0.92 mg/dL (ref 0.40–1.50)
GFR: 82.45 mL/min (ref >60.00)
Glucose, Bld: 96 mg/dL (ref 70–99)
Potassium: 3.9 meq/L (ref 3.5–5.1)
Sodium: 143 meq/L (ref 135–145)
Total Bilirubin: 0.8 mg/dL (ref 0.2–1.2)
Total Protein: 6.6 g/dL (ref 6.0–8.3)

## 2024-03-02 LAB — CBC
HCT: 46.6 % (ref 39.0–52.0)
Hemoglobin: 15.2 g/dL (ref 13.0–17.0)
MCHC: 32.7 g/dL (ref 30.0–36.0)
MCV: 90 fl (ref 78.0–100.0)
Platelets: 256 10*3/uL (ref 150.0–400.0)
RBC: 5.18 Mil/uL (ref 4.22–5.81)
RDW: 13.7 % (ref 11.5–15.5)
WBC: 5.8 10*3/uL (ref 4.0–10.5)

## 2024-03-02 NOTE — Assessment & Plan Note (Signed)
 Following with GI.  Currently on Lialda 2.4g daily.  We will check labs today as above including B vitamins.

## 2024-03-02 NOTE — Assessment & Plan Note (Signed)
 Following with neurology.  Currently on propranolol 40 mg daily.  As above concern this may be causing some postural dizziness.  We did discuss trial off however he would like to hold off on this for now.  Will defer further management to neurology.

## 2024-03-02 NOTE — Progress Notes (Signed)
   Shaun Armstrong is a 73 y.o. male who presents today for an office visit.  Assessment/Plan:  New/Acute Problems: Dizziness  Overall reassuring exam.  His symptoms are improving.  Likely due to side effect of propranolol.  He only gets symptoms when standing up quickly.  No symptoms at rest or other neurologic signs or symptoms.  We did discuss importance of good hydration.  Will check labs today to rule out other possible causes.  We did discuss trial off propranolol however he would like to continue for now given that his symptoms are improving.  He will let us know if symptoms do not continue to improve or if he has any worsening symptoms over the next couple of weeks though as above would recommend trial off propranolol if symptoms do not improve.  We discussed reasons to return to care and seek emergent care.   Chronic Problems Addressed Today: Essential tremor Following with neurology.  Currently on propranolol 40 mg daily.  As above concern this may be causing some postural dizziness.  We did discuss trial off however he would like to hold off on this for now.  Will defer further management to neurology.  Crohn's disease of both small and large intestine without complication (HCC) Following with GI.  Currently on Lialda 2.4g daily.  We will check labs today as above including B vitamins.     Subjective:  HPI:  See Assessment / plan for status of chronic conditions. Patient is here with dizziness.  Started about a week ago or so go. Patient has a hard time describing his symptoms though notes that when he stands up he feels "woozy." Some mild room spinning sensations. No symptoms at rest. No fevers or chills. No recent illness. Symptoms are improving the last few days.  No weakness or numbness.  No obvious precipitating events.  No treatments tried.  No chest pain or shortness of breath.  He is trying to get plenty of fluids and stay hydrated.        Objective:  Physical Exam: BP  112/75   Pulse (!) 56   Temp (!) 97.2 F (36.2 C) (Temporal)   Ht 5\' 9"  (1.753 m)   Wt 183 lb 12.8 oz (83.4 kg)   SpO2 97%   BMI 27.14 kg/m   Orthostatic vitals: Sitting: 113/73, 53 Standing: 126/66, 62 Supine: 106/71, 50 Gen: No acute distress, resting comfortably CV: Regular rate and rhythm with no murmurs appreciated Pulm: Normal work of breathing, clear to auscultation bilaterally with no crackles, wheezes, or rhonchi Neuro: CN 2-12 intact.  No ataxia.  Finger-nose test finger testing intact bilaterally. Psych: Normal affect and thought content      [name] M. Jimmey Ralph, MD 03/02/2024 9:16 AM

## 2024-03-02 NOTE — Patient Instructions (Addendum)
 It was very nice to see you today!  I think your symptoms are probably to a side effect of the propranolol.  We will check blood work today.  Please make sure that you are getting plenty of fluids.  You can try stopping the medication for couple of days to see if this improves your symptoms.  Let me know in a few weeks how you are doing.  Return if symptoms worsen or fail to improve.   Take care, Dr Jimmey Ralph  PLEASE NOTE:  If you had any lab tests, please let us know if you have not heard back within a few days. You may see your results on mychart before we have a chance to review them but we will give you a call once they are reviewed by [place].   If we ordered any referrals today, please let us know if you have not heard from their office within the next week.   If you had any urgent prescriptions sent in today, please check with the pharmacy within an hour of our visit to make sure the prescription was transmitted appropriately.   Please try these tips to maintain a healthy lifestyle:  Eat at least 3 REAL meals and 1-2 snacks per day.  Aim for no more than 5 hours between eating.  If you eat breakfast, please do so within one hour of getting up.   Each meal should contain half fruits/vegetables, one quarter protein, and one quarter carbs (no bigger than a computer mouse)  Cut down on sweet beverages. This includes juice, soda, and sweet tea.   Drink at least 1 glass of water with each meal and aim for at least 8 glasses per day  Exercise at least 150 minutes every week.

## 2024-03-03 LAB — FOLATE: Folate: >25.2 ng/mL (ref >5.9)

## 2024-03-03 LAB — TSH: TSH: 2.67 u[IU]/mL (ref 0.35–5.50)

## 2024-03-03 LAB — VITAMIN B12: Vitamin B-12: 407 pg/mL (ref 211–911)

## 2024-03-06 LAB — VITAMIN B1: Vitamin B1 (Thiamine): 9 nmol/L (ref 8–30)

## 2024-03-08 ENCOUNTER — Encounter: Payer: Self-pay | Admitting: Family Medicine

## 2024-03-08 NOTE — Progress Notes (Signed)
 As thiabendazole the lower side of normal but the rest of his labs are all stable.  He can start a thiamine supplement if he is not doing so already.  We should recheck again in 3 to 6 months.  As we discussed at his office visit I believe a lot of his symptoms are probably coming from the propranolol.  He should let us know if his symptoms are not improving.

## 2024-03-18 ENCOUNTER — Other Ambulatory Visit: Payer: Self-pay | Admitting: Family Medicine

## 2024-03-18 ENCOUNTER — Other Ambulatory Visit (HOSPITAL_COMMUNITY): Payer: Self-pay

## 2024-03-18 ENCOUNTER — Other Ambulatory Visit: Payer: Self-pay

## 2024-03-18 MED ORDER — RIVAROXABAN 20 MG PO TABS
20.0000 mg | ORAL_TABLET | Freq: Every day | ORAL | 0 refills | Status: DC
  Filled 2024-03-18 – 2024-04-06 (×2): qty 30, 30d supply, fill #0

## 2024-03-19 ENCOUNTER — Other Ambulatory Visit (HOSPITAL_COMMUNITY): Payer: Self-pay

## 2024-03-19 ENCOUNTER — Other Ambulatory Visit (HOSPITAL_BASED_OUTPATIENT_CLINIC_OR_DEPARTMENT_OTHER): Payer: Self-pay

## 2024-03-23 ENCOUNTER — Other Ambulatory Visit: Payer: Self-pay

## 2024-03-24 ENCOUNTER — Other Ambulatory Visit: Payer: Self-pay

## 2024-03-29 ENCOUNTER — Other Ambulatory Visit: Payer: Self-pay

## 2024-03-29 DIAGNOSIS — H353122 Nonexudative age-related macular degeneration, left eye, intermediate dry stage: Secondary | ICD-10-CM | POA: Diagnosis not present

## 2024-03-29 DIAGNOSIS — H04123 Dry eye syndrome of bilateral lacrimal glands: Secondary | ICD-10-CM | POA: Diagnosis not present

## 2024-04-06 ENCOUNTER — Other Ambulatory Visit: Payer: Self-pay

## 2024-04-11 DIAGNOSIS — F1020 Alcohol dependence, uncomplicated: Secondary | ICD-10-CM | POA: Diagnosis not present

## 2024-04-11 DIAGNOSIS — F331 Major depressive disorder, recurrent, moderate: Secondary | ICD-10-CM | POA: Diagnosis not present

## 2024-04-18 DIAGNOSIS — F1020 Alcohol dependence, uncomplicated: Secondary | ICD-10-CM | POA: Diagnosis not present

## 2024-04-18 DIAGNOSIS — F331 Major depressive disorder, recurrent, moderate: Secondary | ICD-10-CM | POA: Diagnosis not present

## 2024-04-26 DIAGNOSIS — F332 Major depressive disorder, recurrent severe without psychotic features: Secondary | ICD-10-CM | POA: Diagnosis not present

## 2024-04-26 DIAGNOSIS — F331 Major depressive disorder, recurrent, moderate: Secondary | ICD-10-CM | POA: Diagnosis not present

## 2024-04-26 DIAGNOSIS — F1020 Alcohol dependence, uncomplicated: Secondary | ICD-10-CM | POA: Diagnosis not present

## 2024-04-27 ENCOUNTER — Other Ambulatory Visit (HOSPITAL_BASED_OUTPATIENT_CLINIC_OR_DEPARTMENT_OTHER): Payer: Self-pay

## 2024-04-27 MED ORDER — NALTREXONE HCL 50 MG PO TABS
50.0000 mg | ORAL_TABLET | Freq: Every day | ORAL | 0 refills | Status: DC
  Filled 2024-04-28: qty 30, 30d supply, fill #0

## 2024-04-27 MED ORDER — SERTRALINE HCL 25 MG PO TABS
25.0000 mg | ORAL_TABLET | Freq: Every day | ORAL | 1 refills | Status: DC
  Filled 2024-04-28: qty 30, 30d supply, fill #0
  Filled 2024-05-26 – 2024-05-31 (×2): qty 30, 30d supply, fill #1

## 2024-04-28 ENCOUNTER — Other Ambulatory Visit: Payer: Self-pay

## 2024-05-03 ENCOUNTER — Encounter: Payer: Self-pay | Admitting: Family Medicine

## 2024-05-03 ENCOUNTER — Ambulatory Visit (INDEPENDENT_AMBULATORY_CARE_PROVIDER_SITE_OTHER): Admitting: Family Medicine

## 2024-05-03 VITALS — BP 119/78 | HR 64 | Temp 97.2°F | Ht 69.0 in | Wt 184.8 lb

## 2024-05-03 DIAGNOSIS — L578 Other skin changes due to chronic exposure to nonionizing radiation: Secondary | ICD-10-CM | POA: Diagnosis not present

## 2024-05-03 DIAGNOSIS — R42 Dizziness and giddiness: Secondary | ICD-10-CM

## 2024-05-03 DIAGNOSIS — G250 Essential tremor: Secondary | ICD-10-CM | POA: Diagnosis not present

## 2024-05-03 DIAGNOSIS — S60352A Superficial foreign body of left thumb, initial encounter: Secondary | ICD-10-CM | POA: Diagnosis not present

## 2024-05-03 DIAGNOSIS — G3184 Mild cognitive impairment, so stated: Secondary | ICD-10-CM | POA: Diagnosis not present

## 2024-05-03 DIAGNOSIS — R400 Somnolence: Secondary | ICD-10-CM | POA: Diagnosis not present

## 2024-05-03 DIAGNOSIS — R454 Irritability and anger: Secondary | ICD-10-CM | POA: Insufficient documentation

## 2024-05-03 DIAGNOSIS — R634 Abnormal weight loss: Secondary | ICD-10-CM

## 2024-05-03 LAB — COMPREHENSIVE METABOLIC PANEL WITH GFR
ALT: 48 U/L (ref 0–53)
AST: 48 U/L — ABNORMAL HIGH (ref 0–37)
Albumin: 4.2 g/dL (ref 3.5–5.2)
Alkaline Phosphatase: 52 U/L (ref 39–117)
BUN: 17 mg/dL (ref 6–23)
CO2: 30 meq/L (ref 19–32)
Calcium: 9.7 mg/dL (ref 8.4–10.5)
Chloride: 105 meq/L (ref 96–112)
Creatinine, Ser: 0.93 mg/dL (ref 0.40–1.50)
GFR: 81.29 mL/min (ref >60.00)
Glucose, Bld: 68 mg/dL — ABNORMAL LOW (ref 70–99)
Potassium: 4.2 meq/L (ref 3.5–5.1)
Sodium: 144 meq/L (ref 135–145)
Total Bilirubin: 0.5 mg/dL (ref 0.2–1.2)
Total Protein: 6.1 g/dL (ref 6.0–8.3)

## 2024-05-03 LAB — CBC
HCT: 45.1 % (ref 39.0–52.0)
Hemoglobin: 15.1 g/dL (ref 13.0–17.0)
MCHC: 33.5 g/dL (ref 30.0–36.0)
MCV: 88.4 fl (ref 78.0–100.0)
Platelets: 252 10*3/uL (ref 150.0–400.0)
RBC: 5.1 Mil/uL (ref 4.22–5.81)
RDW: 14.3 % (ref 11.5–15.5)
WBC: 6.8 10*3/uL (ref 4.0–10.5)

## 2024-05-03 LAB — PSA: PSA: 3.78 ng/mL (ref 0.10–4.00)

## 2024-05-03 LAB — TSH: TSH: 2.44 u[IU]/mL (ref 0.35–5.50)

## 2024-05-03 NOTE — Assessment & Plan Note (Signed)
 Patient is recently established with psychiatry for irritability and they wish for him to pursue dementia evaluation.  Will place referral to neurology today.

## 2024-05-03 NOTE — Assessment & Plan Note (Signed)
 This is still an ongoing issue.  We did recommend referral for sleep study however he declined.

## 2024-05-03 NOTE — Assessment & Plan Note (Signed)
 Follows with Dr. Winferd Hatter.  On propranolol  20 mg daily this does help managing his essential tremor symptoms however it is also causing some dizziness.  He can discuss with him about alternative options.

## 2024-05-03 NOTE — Assessment & Plan Note (Signed)
 Patient with a few skin seborrheic keratoses and actinic keratoses on exam.  Advised him to follow-up with dermatology soon.

## 2024-05-03 NOTE — Assessment & Plan Note (Signed)
 He is following with psychiatry for this.  They started him on Zoloft .  Working through with therapy as well.  Symptoms are improving.

## 2024-05-03 NOTE — Patient Instructions (Signed)
 It was very nice to see you today!  Please come back for us  to remove the foreign body under your fingernail if not improving in the next several weeks.  I will refer you to see the neurologist.  Your dizziness is probably coming from her propranolol .  You can discuss more with Dr. If this continues to be an issue.  We will check blood work today.  Please make sure that you are try to get plenty of protein in your diet.  Let us  know if you continue to lose weight.  Return if symptoms worsen or fail to improve.   Take care, Dr Daneil Dunker  PLEASE NOTE:  If you had any lab tests, please let us  know if you have not heard back within a few days. You may see your results on mychart before we have a chance to review them but we will give you a call once they are reviewed by us .   If we ordered any referrals today, please let us  know if you have not heard from their office within the next week.   If you had any urgent prescriptions sent in today, please check with the pharmacy within an hour of our visit to make sure the prescription was transmitted appropriately.   Please try these tips to maintain a healthy lifestyle:  Eat at least 3 REAL meals and 1-2 snacks per day.  Aim for no more than 5 hours between eating.  If you eat breakfast, please do so within one hour of getting up.   Each meal should contain half fruits/vegetables, one quarter protein, and one quarter carbs (no bigger than a computer mouse)  Cut down on sweet beverages. This includes juice, soda, and sweet tea.   Drink at least 1 glass of water with each meal and aim for at least 8 glasses per day  Exercise at least 150 minutes every week.

## 2024-05-03 NOTE — Progress Notes (Signed)
 Shaun Armstrong is a 73 y.o. male who presents today for an office visit.  Assessment/Plan:  New/Acute Problems: Left Thumb Foreign Body  No red flags on exam.  Does seem to be improving over the last few weeks.  We did discuss excision versus watchful waiting.  He would like to hold off on excision for now.  Anticipate that this will continue to improve over the next several weeks however if symptoms fail to improve we can perform excision at that point with biopsy to rule out other potential etiologies such as melanoma.  Dizziness Likely due to propranolol  as symptoms resolved when he was off of this.  He will discuss more with neurology about alternative treatment options.  Unintentional Weight Loss  Down about 10 pounds over the last 10 weeks.  Check labs today.  We discussed importance of high-protein diet.  He is also due for colonoscopy in a few weeks and will call to schedule this soon.  He will follow-up with us  in a few weeks.  Chronic Problems Addressed Today: Essential tremor Follows with Dr. Winferd Hatter.  On propranolol  20 mg daily this does help managing his essential tremor symptoms however it is also causing some dizziness.  He can discuss with him about alternative options.  Mild cognitive impairment Patient is recently established with psychiatry for irritability and they wish for him to pursue dementia evaluation.  Will place referral to neurology today.  Sun-damaged skin Patient with a few skin seborrheic keratoses and actinic keratoses on exam.  Advised him to follow-up with dermatology soon.  Daytime somnolence This is still an ongoing issue.  We did recommend referral for sleep study however he declined.  Irritability He is following with psychiatry for this.  They started him on Zoloft.  Working through with therapy as well.  Symptoms are improving.     Subjective:  HPI:  See A/P for status of chronic conditions.  Patient is here today with multiple issues he would  like to discuss.  I last saw him a couple of months ago for dizziness.  Recheck labs at that time which were all reassuring.  There was thought that his symptoms may be due to his propranolol  and we recommended a trial off of this however he elcted to not to do this.  He has several issues that he would like to discuss today:  He is still having intermittent dizziness about once or twice per week. HE did end up stopping his propranolol  for several week. His tremor got worse while off the medications but does think that his dizziness resolved while off his propranolol .   Since our last visit he has been following with psychiatry. They have started him on Zoloft. He has noticed more irritability recently. They recommended he be referred to neuology to rule out dementia or other neurologic issues.    Patient has noticed foreign body in his left thumbnail for the last few weeks.  Thinks he acquired this while woodworking.  Believes that he has a splinter under his thumbnail.  He did try removing it with forceps however this was very painful.  Currently does not have any pain with rest or with activity.  He believes that the area is slowly improving over the last few weeks.  Is also noticed about 10 pounds of weight loss over the last couple of months.  He has not noticed any other changes symptoms aside from as described above.  No nausea or vomiting.  No constipation or  diarrhea.  No melena or hematochezia.  He has tried to maintain his normal activity and diet levels.       Objective:  Physical Exam: BP 119/78   Pulse 64   Temp (!) 97.2 F (36.2 C) (Temporal)   Ht 5\' 9"  (1.753 m)   Wt 184 lb 12.8 oz (83.8 kg)   SpO2 97%   BMI 27.29 kg/m   Wt Readings from Last 3 Encounters:  05/03/24 184 lb 12.8 oz (83.8 kg)  03/02/24 183 lb 12.8 oz (83.4 kg)  02/11/24 194 lb 3.2 oz (88.1 kg)    Gen: No acute distress, resting comfortably HEENT: Left EAC and TM clear.  Right EAC clear.  Right TM  perforation noted. CV: Regular rate and rhythm with no murmurs appreciated Pulm: Normal work of breathing, clear to auscultation bilaterally with no crackles, wheezes, or rhonchi Skin: Linear black appearing lesion along distal aspect of left thumbnail.  No surrounding erythema. Neuro: Grossly normal, moves all extremities Psych: Normal affect and thought content  Time Spent: 40 minutes of total time was spent on the date of the encounter performing the following actions: chart review prior to seeing the patient, obtaining history, performing a medically necessary exam, counseling on the treatment plan, placing orders, and documenting in our EHR.        Jinny Mounts. Daneil Dunker, MD 05/03/2024 12:44 PM

## 2024-05-05 ENCOUNTER — Ambulatory Visit: Payer: Self-pay | Admitting: Family Medicine

## 2024-05-05 DIAGNOSIS — R748 Abnormal levels of other serum enzymes: Secondary | ICD-10-CM

## 2024-05-05 NOTE — Progress Notes (Signed)
 One of his liver numbers is slightly elevated.  Recommend he come back in 1 to 2 weeks to recheck c-Met.  Please also order lipase and GGT with this as well.  The rest of his labs are all stable.

## 2024-05-11 DIAGNOSIS — F331 Major depressive disorder, recurrent, moderate: Secondary | ICD-10-CM | POA: Diagnosis not present

## 2024-05-11 DIAGNOSIS — F1020 Alcohol dependence, uncomplicated: Secondary | ICD-10-CM | POA: Diagnosis not present

## 2024-05-12 ENCOUNTER — Ambulatory Visit: Admitting: Neurology

## 2024-05-23 ENCOUNTER — Other Ambulatory Visit: Payer: Self-pay | Admitting: Family Medicine

## 2024-05-23 ENCOUNTER — Other Ambulatory Visit: Payer: Self-pay

## 2024-05-23 DIAGNOSIS — F331 Major depressive disorder, recurrent, moderate: Secondary | ICD-10-CM | POA: Diagnosis not present

## 2024-05-23 DIAGNOSIS — F1020 Alcohol dependence, uncomplicated: Secondary | ICD-10-CM | POA: Diagnosis not present

## 2024-05-23 MED ORDER — RIVAROXABAN 20 MG PO TABS
20.0000 mg | ORAL_TABLET | Freq: Every day | ORAL | 11 refills | Status: AC
  Filled 2024-05-23: qty 30, 30d supply, fill #0
  Filled 2024-05-26 – 2024-06-21 (×2): qty 30, 30d supply, fill #1
  Filled 2024-08-04: qty 30, 30d supply, fill #2
  Filled 2024-08-05 (×2): qty 30, 30d supply, fill #0
  Filled 2024-08-30: qty 30, 30d supply, fill #1
  Filled 2024-10-18: qty 30, 30d supply, fill #2
  Filled 2024-11-25: qty 30, 30d supply, fill #3
  Filled 2025-01-13: qty 30, 30d supply, fill #4

## 2024-05-24 NOTE — Progress Notes (Signed)
 Assessment/Plan:    1.  Tremor - Patient went off of propranolol , but is back on it.  He is fairly bradycardic, likely because of the medication.  He denies he ever had dizziness, but states that he stopped it because of fatigue, but the fatigue did not get better off of the medication but the tremor got worse so he went back on it.  I told him to make sure that his primary care was okay with him being this bradycardic on this medication. - He cannot be on primidone because of interaction with Xarelto .   - The only other medications left are second line medications and we have discussed what those were and why they are second line medications.  He does not want this right now.               - Have discussed relationship between alcohol and tremor and he has been weaning that. -We discussed surgical interventions, including deep brain stimulation and focused ultrasound.  His half-sister is getting ready to undergo focused ultrasound currently, so he is aware of the procedure.     2.  Bilateral pulmonary embolism             -On Xarelto    3.  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  4.  Memory change  - Sleep apnea can play a role (esp with EDS), but patient declined nocturnal polysomnogram according to PCP notes.  Patient states that he does not recall that discussion and we had a long discussion about it today and he is agreeable to sleep medicine consultation today.  - We will go ahead and schedule MRI brain.  I do not see anything new that is focal or lateralizing (known facial droop because of history of removal of acoustic neuroma).  Discussed with patient that the MRI in this age group often will show hardening of the arteries/cerebral small vessel disease and atrophy of the brain.  Discussed that this is  not uncommon and would be an incidental finding in this case.  We are ordering the MRI to make sure that we are not missing other nonincidental findings.  - Schedule neurocognitive testing.   Subjective:   Shaun Armstrong was seen today in follow up.  When I last saw him, he was on propranolol  from his primary care physician, but I told him I did not feel comfortable raising up that dosage for tremor because he was already bradycardic.  Unfortunately, his options were very limited for tremor because I could not give him primidone as he was on Xarelto .  He was not really interested in the second line medications.  We did talk extensively about the role that alcohol could play in tremor.  Since that time, his primary care has had to discontinue his propranolol  because of dizziness.  His primary care referred him back to see if there were other options for tremor, but more importantly his psychiatrist apparently wanted him evaluated for dementia.  Patient does have daytime hypersomnolence.  Sleep study was recommended, but declined.  Pt reports that he is back on the propranolol  now.  Pt states that he d/c it for a week but wife states that  he d/c it for months.  He is back on 2 per day for about 2 weeks.  Pt denies ever having dizziness but admits to fatigue and memory issues.  They also admit that those didn't change when it was d/c.  He isn't sure it is as effective as it was prior to when he stopped the medication.  He reports cut significantly back on alcohol.  He drinks 1-2 beers now 3 x per week.  Wife states that his counselor wanted to make sure that his anger outbursts and fatigue were not a part of memory change.  He is hypersomnolent.  He gets up in the morning eats breakfast and goes back to bed.  Pt last took propranolol  yesterday.  Only takes it at bedtime.   ALLERGIES:   Allergies  Allergen Reactions   Cefuroxime Other (See Comments)    Severe dehydration, not certain if this is true      Poison Ivy Extract Rash    CURRENT MEDICATIONS:  Current Meds  Medication Sig   Cholecalciferol (VITAMIN D ) 125 MCG (5000 UT) CAPS Take 1 capsule by mouth once a week.   glucosamine-chondroitin 500-400 MG tablet Take 1 tablet by mouth daily. With msm   mesalamine  (LIALDA ) 1.2 g EC tablet Take 2 tablets (2.4 g total) by mouth daily.   Multiple Vitamin (MULTIVITAMIN) tablet Take 1 tablet by mouth daily.   Omega-3 Fatty Acids (FISH OIL) 1000 MG CAPS Take 2,000 mg by mouth daily.   Polyethyl Glycol-Propyl Glycol (SYSTANE FREE OP) Place 2 drops into both eyes 5 (five) times daily as needed (dry eyes).   propranolol  (INDERAL ) 20 MG tablet Take 2 tablets (40 mg total) by mouth daily.   rivaroxaban  (XARELTO ) 20 MG TABS tablet Take 1 tablet (20 mg total) by mouth daily with supper.   rosuvastatin  (CRESTOR ) 40 MG tablet Take 1 tablet (40 mg total) by mouth daily.   sertraline  (ZOLOFT ) 25 MG tablet Take 1 tablet (25 mg total) by mouth daily.   Turmeric 500 MG TABS Take by mouth.      Objective:    PHYSICAL EXAMINATION:    VITALS:   Vitals:   05/26/24 1022  BP: 124/78  Pulse: (!) 49  SpO2: 98%  Weight: 184 lb 1.6 oz (83.5 kg)  Height: 5' 9 (1.753 m)    GEN:  The patient appears stated age and is in NAD. HEENT:  Normocephalic, atraumatic.  The mucous membranes are moist. The superficial temporal arteries are without ropiness or tenderness. CV:  brady.  regular Lungs:  CTAB Neck/HEME:  There are no carotid bruits bilaterally.  Neurological examination:  Orientation:     05/26/2024   10:00 AM  Montreal Cognitive Assessment   Visuospatial/ Executive (0/5) 3  Naming (0/3) 3  Attention: Read list of digits (0/2) 2  Attention: Read list of letters (0/1) 1  Attention: Serial 7 subtraction starting at 100 (0/3) 2  Language: Repeat phrase (0/2) 2  Language : Fluency (0/1) 1  Abstraction (0/2) 0  Delayed Recall (0/5) 4  Orientation (0/6) 6  Total 24    Cranial nerves: There is  chronic R facial droop since removal of acoustic neuroma.  The speech is fluent and clear. Soft palate rises symmetrically and there is no tongue deviation. Hearing is decreased to conversational tone on the R (since acoustic neuroma sx). Sensation: Sensation is intact to light touch throughout Motor: Strength is at least antigravity x4.  Movement examination: Tone: There is normal tone in  the bilateral upper extremities.  The tone in the lower extremities is normal.  Abnormal movements: There is no rest tremor.  There is minimal postural tremor.  There is mild intention tremor.  He has trouble with Archimedes spirals on the left.  Coordination:  There is no decremation with RAM's, with any form of RAMS, including alternating supination and pronation of the forearm, hand opening and closing, finger taps, heel taps and toe taps.  Gait and Station: The patient has no difficulty arising out of a deep-seated chair without the use of the hands. The patient's stride length is good.   I have reviewed and interpreted the following labs independently   Chemistry      Component Value Date/Time   NA 144 05/03/2024 1227   NA 143 07/01/2016 0000   K 4.2 05/03/2024 1227   CL 105 05/03/2024 1227   CO2 30 05/03/2024 1227   BUN 17 05/03/2024 1227   CREATININE 0.93 05/03/2024 1227   CREATININE 0.99 10/16/2020 0959      Component Value Date/Time   CALCIUM  9.7 05/03/2024 1227   ALKPHOS 52 05/03/2024 1227   AST 48 (H) 05/03/2024 1227   ALT 48 05/03/2024 1227   BILITOT 0.5 05/03/2024 1227      Lab Results  Component Value Date   WBC 6.8 05/03/2024   HGB 15.1 05/03/2024   HCT 45.1 05/03/2024   MCV 88.4 05/03/2024   PLT 252.0 05/03/2024   Lab Results  Component Value Date   TSH 2.44 05/03/2024     Chemistry      Component Value Date/Time   NA 144 05/03/2024 1227   NA 143 07/01/2016 0000   K 4.2 05/03/2024 1227   CL 105 05/03/2024 1227   CO2 30 05/03/2024 1227   BUN 17 05/03/2024 1227    CREATININE 0.93 05/03/2024 1227   CREATININE 0.99 10/16/2020 0959      Component Value Date/Time   CALCIUM  9.7 05/03/2024 1227   ALKPHOS 52 05/03/2024 1227   AST 48 (H) 05/03/2024 1227   ALT 48 05/03/2024 1227   BILITOT 0.5 05/03/2024 1227      Lab Results  Component Value Date   VITAMINB12 407 03/02/2024      Total time spent on today's visit was 40 minutes, including both face-to-face time and nonface-to-face time.  Time included that spent on review of records (prior notes available to me/labs/imaging if pertinent), discussing treatment and goals, answering patient's questions and coordinating care.  Cc:  Rodney Clamp, MD

## 2024-05-26 ENCOUNTER — Other Ambulatory Visit (HOSPITAL_COMMUNITY): Payer: Self-pay

## 2024-05-26 ENCOUNTER — Ambulatory Visit: Admitting: Neurology

## 2024-05-26 ENCOUNTER — Other Ambulatory Visit: Payer: Self-pay | Admitting: Family Medicine

## 2024-05-26 ENCOUNTER — Other Ambulatory Visit: Payer: Self-pay

## 2024-05-26 ENCOUNTER — Ambulatory Visit

## 2024-05-26 VITALS — BP 124/78 | HR 49 | Ht 69.0 in | Wt 184.1 lb

## 2024-05-26 DIAGNOSIS — G250 Essential tremor: Secondary | ICD-10-CM | POA: Diagnosis not present

## 2024-05-26 DIAGNOSIS — Z86018 Personal history of other benign neoplasm: Secondary | ICD-10-CM | POA: Diagnosis not present

## 2024-05-26 DIAGNOSIS — G4710 Hypersomnia, unspecified: Secondary | ICD-10-CM | POA: Diagnosis not present

## 2024-05-26 DIAGNOSIS — R413 Other amnesia: Secondary | ICD-10-CM | POA: Diagnosis not present

## 2024-05-26 MED ORDER — PROPRANOLOL HCL 20 MG PO TABS
40.0000 mg | ORAL_TABLET | Freq: Every day | ORAL | 3 refills | Status: AC
  Filled 2024-05-26 – 2024-05-31 (×2): qty 180, 90d supply, fill #0
  Filled 2024-08-30: qty 180, 90d supply, fill #1
  Filled 2024-11-25: qty 180, 90d supply, fill #2

## 2024-05-26 NOTE — Patient Instructions (Signed)
 You have been referred for a neurocognitive evaluation (i.e., evaluation of memory and thinking abilities). Please bring someone with you to this appointment if possible, as it is helpful for the neuropsychologist to hear from both you and another adult who knows you well. Please bring eyeglasses and hearing aids if you wear them and take any medications as you normally would. Please fully abstain from all alcohol, marijuana, or other substances prior to your appointment.   The evaluation will take approximately 2-3 hours and has two parts:   The first part is a clinical interview with the neuropsychologist, Dr. Milbert Coulter or Dr. Robbie Lis. During the interview, the neuropsychologist will speak with you and the individual you brought to the appointment.    The second part of the evaluation is testing with the doctor's technician, aka psychometrician, Annabelle Harman or [employer]. During the testing, the technician will ask you to remember different types of material, solve problems, and answer some questionnaires. Your family member will not be present for this portion of the evaluation.   Please note: We have to reserve several hours of the neuropsychologist's time and the psychometrician's time for your evaluation appointment. As such, there is a No-Show fee of $100. If you are unable to attend any of your appointments, please contact our office as soon as possible to reschedule.

## 2024-05-31 ENCOUNTER — Other Ambulatory Visit (HOSPITAL_COMMUNITY): Payer: Self-pay

## 2024-05-31 ENCOUNTER — Other Ambulatory Visit: Payer: Self-pay

## 2024-05-31 ENCOUNTER — Other Ambulatory Visit (HOSPITAL_BASED_OUTPATIENT_CLINIC_OR_DEPARTMENT_OTHER): Payer: Self-pay

## 2024-05-31 DIAGNOSIS — F331 Major depressive disorder, recurrent, moderate: Secondary | ICD-10-CM | POA: Diagnosis not present

## 2024-05-31 DIAGNOSIS — F1020 Alcohol dependence, uncomplicated: Secondary | ICD-10-CM | POA: Diagnosis not present

## 2024-05-31 DIAGNOSIS — F332 Major depressive disorder, recurrent severe without psychotic features: Secondary | ICD-10-CM | POA: Diagnosis not present

## 2024-05-31 MED ORDER — SERTRALINE HCL 25 MG PO TABS
25.0000 mg | ORAL_TABLET | Freq: Every day | ORAL | 0 refills | Status: DC
  Filled 2024-07-01: qty 60, 60d supply, fill #0

## 2024-06-02 ENCOUNTER — Ambulatory Visit
Admission: RE | Admit: 2024-06-02 | Discharge: 2024-06-02 | Disposition: A | Discharge status: Home / Self Care | Source: Ambulatory Visit | Attending: Neurology | Admitting: Neurology

## 2024-06-02 DIAGNOSIS — R413 Other amnesia: Secondary | ICD-10-CM | POA: Diagnosis not present

## 2024-06-02 DIAGNOSIS — Z86018 Personal history of other benign neoplasm: Secondary | ICD-10-CM

## 2024-06-02 DIAGNOSIS — G939 Disorder of brain, unspecified: Secondary | ICD-10-CM | POA: Diagnosis not present

## 2024-06-02 DIAGNOSIS — I6782 Cerebral ischemia: Secondary | ICD-10-CM | POA: Diagnosis not present

## 2024-06-02 DIAGNOSIS — F1020 Alcohol dependence, uncomplicated: Secondary | ICD-10-CM | POA: Diagnosis not present

## 2024-06-02 DIAGNOSIS — F332 Major depressive disorder, recurrent severe without psychotic features: Secondary | ICD-10-CM | POA: Diagnosis not present

## 2024-06-03 ENCOUNTER — Other Ambulatory Visit (HOSPITAL_BASED_OUTPATIENT_CLINIC_OR_DEPARTMENT_OTHER): Payer: Self-pay

## 2024-06-03 DIAGNOSIS — K5010 Crohn's disease of large intestine without complications: Secondary | ICD-10-CM | POA: Diagnosis not present

## 2024-06-03 DIAGNOSIS — K5090 Crohn's disease, unspecified, without complications: Secondary | ICD-10-CM | POA: Diagnosis not present

## 2024-06-03 DIAGNOSIS — Z86018 Personal history of other benign neoplasm: Secondary | ICD-10-CM | POA: Diagnosis not present

## 2024-06-03 MED ORDER — MESALAMINE 1.2 G PO TBEC: 2.4000 g | DELAYED_RELEASE_TABLET | Freq: Every day | ORAL | 5 refills | Status: DC

## 2024-06-06 ENCOUNTER — Encounter: Payer: Self-pay | Admitting: Psychology

## 2024-06-06 DIAGNOSIS — K5140 Inflammatory polyps of colon without complications: Secondary | ICD-10-CM | POA: Insufficient documentation

## 2024-06-06 DIAGNOSIS — R748 Abnormal levels of other serum enzymes: Secondary | ICD-10-CM | POA: Insufficient documentation

## 2024-06-06 DIAGNOSIS — K5080 Crohn's disease of both small and large intestine without complications: Secondary | ICD-10-CM | POA: Insufficient documentation

## 2024-06-07 ENCOUNTER — Ambulatory Visit: Admitting: Psychology

## 2024-06-07 ENCOUNTER — Ambulatory Visit: Payer: Self-pay | Admitting: Neurology

## 2024-06-07 ENCOUNTER — Ambulatory Visit: Payer: Self-pay | Admitting: Psychology

## 2024-06-07 ENCOUNTER — Encounter: Payer: Self-pay | Admitting: Psychology

## 2024-06-07 DIAGNOSIS — D329 Benign neoplasm of meninges, unspecified: Secondary | ICD-10-CM | POA: Insufficient documentation

## 2024-06-07 DIAGNOSIS — R413 Other amnesia: Secondary | ICD-10-CM | POA: Diagnosis not present

## 2024-06-07 DIAGNOSIS — F1090 Alcohol use, unspecified, uncomplicated: Secondary | ICD-10-CM | POA: Diagnosis not present

## 2024-06-07 DIAGNOSIS — R4189 Other symptoms and signs involving cognitive functions and awareness: Secondary | ICD-10-CM

## 2024-06-07 NOTE — Progress Notes (Signed)
   Psychometrician Note   Cognitive testing was administered to Shaun Armstrong by Lonell Jude, B.S. (psychometrist) under the supervision of Dr. Arthea KYM Maryland, Ph.D., ABPP, licensed psychologist on 06/07/2024. Shaun Armstrong did not appear overtly distressed by the testing session per behavioral observation or responses across self-report questionnaires. Rest breaks were offered.    The battery of tests administered was selected by Dr. Arthea KYM Maryland, Ph.D., ABPP with consideration to Shaun Armstrong current level of functioning, the nature of his symptoms, emotional and behavioral responses during interview, level of literacy, observed level of motivation/effort, and the nature of the referral question. This battery was communicated to the psychometrist. Communication between Dr. Arthea KYM Maryland, Ph.D., ABPP and the psychometrist was ongoing throughout the evaluation and Dr. Arthea KYM Maryland, Ph.D., ABPP was immediately accessible at all times. Dr. Zachary C. Merz, Ph.D., ABPP provided supervision to the psychometrist on the date of this service to the extent necessary to assure the quality of all services provided.    Shaun Armstrong will return within approximately 1-2 weeks for an interactive feedback session with Dr. Maryland at which time his test performances, clinical impressions, and treatment recommendations will be reviewed in detail. Shaun Armstrong understands he can contact our office should he require our assistance before this time.  A total of 150 minutes of billable time were spent face-to-face with Shaun Armstrong by the psychometrist. This includes both test administration and scoring time. Billing for these services is reflected in the clinical report generated by Dr. Arthea KYM Maryland, Ph.D., ABPP  This note reflects time spent with the psychometrician and does not include test scores or any clinical interpretations made by Dr. Maryland. The full report will follow in a separate note.

## 2024-06-07 NOTE — Progress Notes (Unsigned)
 NEUROPSYCHOLOGICAL EVALUATION [place] [place] [facility] Department of Neurology  Date of Evaluation: June 07, 2024  Reason for Referral:   Shaun Armstrong is a 73 y.o. right-handed Caucasian male referred by Asberry Schneider, D.O., to characterize his current cognitive functioning and assist with diagnostic clarity and treatment planning in the context of essential tremor and subjective cognitive decline.   Assessment and Plan:   Clinical Impression(s): Shaun Armstrong pattern of performance is suggestive of performance variability across delayed retrieval aspects of memory. Outside of this, all other assessed domains were appropriate relative to age-matched peers. This includes processing speed, attention/concentration, executive functioning, receptive and expressive language, visuospatial abilities, and both encoding (i.e., learning) and recognition/consolidation aspects of memory. Functionally, Shaun Armstrong denied difficulties completing instrumental activities of daily living (ADLs) independently. His wife noted providing him reminders to take his medications from time to time but was otherwise in agreement. While he may be on the border between normal functioning and a mild neurocognitive disorder designation, I do not feel that he warrants a formalized diagnosis at the present time.  Trouble with information retrieval on demand is a fairly nonspecific finding. This can certainly be seen in individuals with essential tremor, as well as those with prior alcohol abuse/dependence concerns and ongoing alcohol consumption (he reported likely 1-2 beers daily). Chronic medical conditions surrounding cholesterol concerns and thyroid  dysfunction can also create memory inefficiencies. There is no current neuroimaging to review. If this scan yields concerns surrounding cerebrovascular disease, this would be unsurprising and could also contribute to memory variability. Overall, the combination of these  factors would seem the most likely cause for testing performances and subjective day-to-day deficits.   Despite memory variability, testing patterns as a whole do not suggest underlying Alzheimer's disease in a compelling fashion. Testing patterns and a lack of behavioral symptoms also would suggest other neurodegenerative illnesses such as Lewy body disease, other more rare parkinsonian presentations, or frontotemporal lobar degeneration also being unlikely. Continued medical monitoring will be important moving forward.  Recommendations: Shaun Armstrong was noncommittal when asked about DBS and/or focused ultrasound to address symptoms of essential tremor. From a testing perspective, I do feel that he could be a good surgical candidate as testing did not raise any red flags. Ongoing alcohol use likely poses the greatest impediment to surgical consideration as this would need to be addressed in a consistent manner first.   The CDC defines heavy drinking in men as 15 or more drinks per week. Shaun Armstrong is likely very near this threshold currently. Chronic heavy drinking will have a negative impact on physical health, as well as on memory and other cognitive abilities. He is encouraged to decrease his regular alcohol intake.   Performance across neurocognitive testing is not a strong predictor of an individual's safety operating a motor vehicle. Should his family wish to pursue a formalized driving evaluation, they could reach out to the following agencies: [employer] in [place]: [phone] [facility]: [phone] [facility]: [phone] [facility]: [phone] or [phone]  Should there be progression of current deficits over time, Shaun Armstrong is unlikely to regain any independent living skills lost. Therefore, it is recommended that he remain as involved as possible in all aspects of household chores, finances, and medication management, with  supervision to ensure adequate performance. He will likely benefit from the establishment and maintenance of a routine in order to maximize his functional abilities over time.  It may be beneficial Shaun Armstrong  to have another person with him when in situations where he may need to process information, weigh the pros and cons of different options, and make decisions, in order to ensure that he fully understands and recalls all information to be considered.  Shaun Armstrong is encouraged to attend to lifestyle factors for brain health (e.g., regular physical exercise, good nutrition habits and consideration of the MIND-DASH diet, regular participation in cognitively-stimulating activities, and general stress management techniques), which are likely to have benefits for both emotional adjustment and cognition. Optimal control of vascular risk factors (including safe cardiovascular exercise and adherence to dietary recommendations) is encouraged. Continued participation in activities which provide mental stimulation and social interaction is also recommended.   Memory can be improved using internal strategies such as rehearsal, repetition, chunking, mnemonics, association, and imagery. External strategies such as written notes in a consistently used memory journal, visual and nonverbal auditory cues such as a calendar on the refrigerator or appointments with alarm, such as on a cell phone, can also help maximize recall.    When learning new information, he would benefit from information being broken up into small, manageable pieces. he may also find it helpful to articulate the material in his own words and in a context to promote encoding at the onset of a new task. This material may need to be repeated multiple times to promote encoding.  To address problems with fluctuating attention and/or executive dysfunction, he may wish to consider:   -Avoiding external distractions when needing to concentrate   -Limiting  exposure to fast paced environments with multiple sensory demands   -Writing down complicated information and using checklists   -Attempting and completing one task at a time (i.e., no multi-tasking)   -Verbalizing aloud each step of a task to maintain focus   -Taking frequent breaks during the completion of steps/tasks to avoid fatigue   -Reducing the amount of information considered at one time   -Scheduling more difficult activities for a time of day where he is usually most alert  Review of Records:   Past Medical History:  Diagnosis Date   Abnormal liver enzymes    Acute deep vein thrombosis (DVT) of proximal vein of right lower extremity 10/05/2020   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   Bilateral pulmonary embolism 10/05/2020   Crohn's disease of both small and large intestine without complication 02/28/2014   Daytime somnolence 09/22/2023   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   Decreased peripheral vision 11/04/2011   Elevated alcohol use    Essential tremor    Gastroesophageal reflux disease without esophagitis 10/25/2018   Hyperlipidemia 10/05/2012   The 10-year ASCVD risk score (Arnett DK, et al., 2019) is: 33.8%    Values used to calculate the score:      Age: 52 years      Sex: Male      Is Non-Hispanic African American: No      Diabetic: No      Tobacco smoker: Yes      Systolic Blood Pressure: 148 mmHg      Is BP treated: No      HDL Cholesterol: 36 mg/dL      Total Cholesterol: 194 mg/dL        Hyperparathyroidism, primary 06/07/2020   Inflammatory polyps of colon without complications    Inguinal hernia 07/2016   Meralgia paresthetica of right side 10/25/2018   Regional enteritis of small intestine with large intestine    Retinal  detachment    S/P parathyroidectomy 05/07/2022   Sun-damaged skin 08/03/2017   Wears hearing aid, left ear     Past Surgical History:  Procedure Laterality Date   ACOUSTIC NEUROMA RESECTION Right    BROW LIFT AND  BLEPHAROPLASTY Bilateral 10/11/2012   CATARACT EXTRACTION W/ INTRAOCULAR LENS  IMPLANT, BILATERAL Bilateral    HERNIA REPAIR     INGUINAL HERNIA REPAIR Right 07/16/2016   Procedure: LAPAROSCOPIC RIGHT INGUINAL HERNIA;  Surgeon: Vicenta Poli, MD;  Location: [facility];  Service: General;  Laterality: Right;   INSERTION OF MESH Right 07/16/2016   Procedure: INSERTION OF MESH;  Surgeon: Vicenta Poli, MD;  Location: [facility];  Service: General;  Laterality: Right;   MELANOMA EXCISION  01/2022   scalp   MOHS SURGERY N/A 11/05/2021   scalp   PARATHYROIDECTOMY Left 05/07/2022   Procedure: MINIMALLY INVASIVE LEFT INFERIOR PARATHYROIDECTOMY;  Surgeon: Rubin Calamity, MD;  Location: [facility];  Service: General;  Laterality: Left;   RETINAL DETACHMENT SURGERY Left    TONSILLECTOMY      Current Outpatient Medications:    Cholecalciferol (VITAMIN D ) 125 MCG (5000 UT) CAPS, Take 1 capsule by mouth once a week., Disp: , Rfl:    glucosamine-chondroitin 500-400 MG tablet, Take 1 tablet by mouth daily. With msm, Disp: , Rfl:    mesalamine  (LIALDA ) 1.2 g EC tablet, Take 2 tablets (2.4 g total) by mouth daily., Disp: 120 tablet, Rfl: 5   mesalamine  (LIALDA ) 1.2 g EC tablet, Take 2 tablets (2.4 g total) by mouth daily., Disp: 120 tablet, Rfl: 5   Multiple Vitamin (MULTIVITAMIN) tablet, Take 1 tablet by mouth daily., Disp: , Rfl:    naltrexone  (DEPADE) 50 MG tablet, Take 1 tablet (50 mg total) by mouth at bedtime. (Patient not taking: Reported on 05/26/2024), Disp: 30 tablet, Rfl: 0   Omega-3 Fatty Acids (FISH OIL) 1000 MG CAPS, Take 2,000 mg by mouth daily., Disp: , Rfl:    Polyethyl Glycol-Propyl Glycol (SYSTANE FREE OP), Place 2 drops into both eyes 5 (five) times daily as needed (dry eyes)., Disp: , Rfl:    propranolol  (INDERAL ) 20 MG tablet, Take 2 tablets (40 mg total) by mouth daily., Disp: 180 tablet, Rfl: 3   rivaroxaban  (XARELTO ) 20 MG TABS tablet, Take 1 tablet (20  mg total) by mouth daily with supper., Disp: 30 tablet, Rfl: 11   rosuvastatin  (CRESTOR ) 40 MG tablet, Take 1 tablet (40 mg total) by mouth daily., Disp: 90 tablet, Rfl: 3   sertraline  (ZOLOFT ) 25 MG tablet, Take 1 tablet (25 mg total) by mouth daily., Disp: 60 tablet, Rfl: 0   Turmeric 500 MG TABS, Take by mouth., Disp: , Rfl:      05/26/2024   10:00 AM  Montreal Cognitive Assessment   Visuospatial/ Executive (0/5) 3  Naming (0/3) 3  Attention: Read list of digits (0/2) 2  Attention: Read list of letters (0/1) 1  Attention: Serial 7 subtraction starting at 100 (0/3) 2  Language: Repeat phrase (0/2) 2  Language : Fluency (0/1) 1  Abstraction (0/2) 0  Delayed Recall (0/5) 4  Orientation (0/6) 6  Total 24   Neuroimaging: Head CT on 06/07/2018 in the context of several falls with positive alcohol use was negative. This scan did suggest a prior right suboccipital craniectomy with encephalomalacia within the right cerebellar hemisphere. A brain MRI was completed on 06/02/2024. However, this scan had not yet been read at the time of the current writing.  Clinical Interview:   The following information was obtained during a clinical interview with Shaun Armstrong and his wife prior to cognitive testing.  Cognitive Symptoms: Decreased short-term memory: Endorsed. He described occasional difficulties recalling recent information such as names, appointments, or details of prior conversations. He also noted some misplacing of things in his environment. Per his perception, difficulties have exhibited a intermittent pattern of dysfunction for the past several months. His wife reported memory concerns for the past several years, exhibiting a more progressive presentation.  Decreased long-term memory: Denied. Decreased attention/concentration: Denied. Reduced processing speed: Endorsed. He reported occasionally feeling mentally foggy.  Difficulties with executive functions: Endorsed. He reported more  longstanding weaknesses surrounding multi-tasking and perhaps organization more broadly. He suggested that these weaknesses were not significant per his perception. His wife reported potential personality changes in that Shaun Armstrong may be a bit more socially isolative, largely due to trouble with name recollection. Difficulties with emotion regulation: Denied. Difficulties with receptive language: Denied. Difficulties with word finding: Denied. Decreased visuoperceptual ability: Denied.  Difficulties completing ADLs: Largely denied. He is independent with medication management. He can be somewhat forgetful and his wife will provide reminders for him to take medications around once per week. His wife manages finances and bill paying which is longstanding in nature. No driving concerns were reported.   Additional Medical History: History of traumatic brain injury/concussion: Denied. History of stroke: Denied. History of seizure activity: Denied. History of known exposure to toxins: Denied. Symptoms of chronic pain: Denied. Experience of frequent headaches/migraines: Denied. Frequent instances of dizziness/vertigo: Denied.  Sensory changes: He reported deafness in his right ear stemming from a removal of an acoustic neuroma in the 1970s. Medical records suggest hearing aid use in his left ear with benefit. He wears reading glasses with benefit and reported that his sense of smell has disappeared.  Balance/coordination difficulties: Denied. One side of the body was not weaker relative to the other and he denied any recent or recurring falls.  Other motor difficulties: He has a history of essential tremor impacting his hands bilaterally. One side was not said to be noticeably worse than the other. He follows with neurology for ongoing management.   Other medical conditions: Prior neuroimaging suggested a remote craniectomy and encephalomalacia involving the right cerebellar hemisphere. I was  unable to locate records specifically discussing the cause of this event. Shaun Armstrong denied any prior surgical procedure involving the back of his head during interview. It was unclear if this was caused by his surgical removal of an acoustic neuroma in the 1970s.  Sleep History: Estimated hours obtained each night: 8+ hours.  Difficulties falling asleep: Denied. Difficulties staying asleep: Denied. Feels rested and refreshed upon awakening: Endorsed. His wife noted that he will commonly nap after waking, eating breakfast, and reading the paper.   History of snoring: Endorsed. History of waking up gasping for air: Denied. Witnessed breath cessation while asleep: Denied. His wife noted that he was scheduled for a sleep study this upcoming August.   History of vivid dreaming: Denied. Excessive movement while asleep: Denied. Instances of acting out his dreams: Denied.  Psychiatric/Behavioral Health History: Depression: He described his current mood as not happy due to neurological concerns surrounding essential tremor and subjective cognitive decline. Outside of this, he denied a history of mental health concerns or formal diagnoses. Current or remote suicidal ideation, intent, or plan was denied.  Anxiety: Denied. Mania: Denied. Trauma History: Denied. Visual/auditory hallucinations: Denied. Delusional thoughts: Denied.  Tobacco: Denied.  Alcohol: He reported consuming 1-2 beers daily. His wife noted that he was seeing a therapist and that elevated alcohol consumption was a topic they discussed semi-regularly. There has been concern in the past for sustained alcohol abuse.  Recreational drugs: Denied.  Family History: Problem Relation Age of Onset   Diabetes Mother    Dementia Mother    Tremor Sister    Diabetes Sister    Tremor Half-Sister    Tremor Half-Sister    Hyperparathyroidism Neg Hx    This information was confirmed by Shaun Armstrong.  Academic/Vocational History: Highest  level of educational attainment: 13 years. He graduated from high school and completed one additional year of college. He described himself as an average (A/B/C) student in academic settings. No relative weaknesses were identified.  History of developmental delay: Denied. History of grade repetition: Denied. Enrollment in special education courses: Denied. History of LD/ADHD: Denied.  Employment: Retired. 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   Evaluation Results:   Behavioral Observations: Shaun Armstrong was accompanied by his wife, arrived to his appointment on time, and was appropriately dressed and groomed. He appeared alert. Observed gait and station were within normal limits. Very mild action and postural tremors were observed during interview. His affect was generally relaxed and positive, but did range appropriately given the subject being discussed during interview. Spontaneous speech was fluent and word finding difficulties were not observed during the clinical interview. Thought processes were coherent, organized, and normal in content. Insight into his cognitive difficulties appeared adequate.   During testing, sustained attention was appropriate. Task engagement was adequate and he persisted when challenged. Overall, Shaun Armstrong was cooperative with the clinical interview and subsequent testing procedures.   Adequacy of Effort: The validity of neuropsychological testing is limited by the extent to which the individual being tested may be assumed to have exerted adequate effort during testing. Shaun Armstrong expressed his intention to perform to the best of his abilities and exhibited adequate task engagement and persistence. Scores across stand-alone and embedded performance validity measures were within expectation. As such, the results of the current evaluation are believed to be a valid representation of Shaun Armstrong current cognitive functioning.  Test  Results: Shaun Armstrong was fully oriented at the time of the current evaluation.  Intellectual abilities based upon educational and vocational attainment were estimated to be in the average range. Premorbid abilities were estimated to be within the above average range based upon a single-word reading test.   Processing speed was average to above average. Basic attention was below average. More complex attention (e.g., working memory) was average. Executive functioning was average to well above average.  While not directly assessed, receptive language abilities were believed to be intact. Likewise, Mr. Finnigan did not exhibit any difficulties comprehending task instructions and answered all questions asked of him appropriately. Assessed expressive language (e.g., verbal fluency and confrontation naming) was mildly variable but overall appropriate, ranging from the below average to above average normative ranges.     Assessed visuospatial/visuoconstructional abilities were average to exceptionally high.    Learning (i.e., encoding) of novel verbal and visual information was mildly variable but overall appropriate, ranging from the below average to exceptionally high normative ranges. Spontaneous delayed recall (i.e., retrieval) of previously learned information was variable, ranging from the well below average to average normative ranges. Retention rates were 133% across a list learning task, 88% across a story learning task, 88% across a daily living task,  and 33% across a shape learning task. Performance across recognition tasks was below average to average, suggesting evidence for information consolidation.   Results of emotional screening instruments suggested that recent symptoms of generalized anxiety were in the mild range, while symptoms of depression were within normal limits. A screening instrument assessing recent sleep quality suggested the presence of minimal sleep dysfunction.  Table of Scores:    Note: This summary of test scores accompanies the interpretive report and should not be considered in isolation without reference to the appropriate sections in the text. Descriptors are based on appropriate normative data and may be adjusted based on clinical judgment. Terms such as Within Normal Limits and Outside Normal Limits are used when a more specific description of the test score cannot be determined.       Percentile - Normative Descriptor > 98 - Exceptionally High 91-97 - Well Above Average 75-90 - Above Average 25-74 - Average 9-24 - Below Average 2-8 - Well Below Average < 2 - Exceptionally Low       Validity:   DESCRIPTOR       DCT: --- --- Within Normal Limits       Orientation:      Raw Score Percentile   NAB Orientation, Form 1 29/29 --- ---       Cognitive Screening:      Raw Score Percentile   SLUMS: 22/30 --- ---       Intellectual Functioning:      Standard Score Percentile   Test of Premorbid Functioning: 115 84 Above Average       Memory:     NAB Memory Module, Form 1: Standard Score/ T Score Percentile   Total Memory Index 89 23 Below Average  List Learning       Total Trials 1-3 22/36 (53) 62 Average    List B 5/12 (58) 79 Above Average    Short Delay Free Recall 3/12 (35) 7 Well Below Average    Long Delay Free Recall 4/12 (40) 16 Below Average    Retention Percentage 133 (62) 88 Above Average    Recognition Discriminability 6 (48) 42 Average  Shape Learning       Total Trials 1-3 12/27 (44) 27 Average    Delayed Recall 2/9 (30) 2 Well Below Average    Retention Percentage 33 (31) 3 Well Below Average    Recognition Discriminability 5 (42) 21 Below Average  Story Learning       Immediate Recall 42/80 (37) 9 Below Average    Delayed Recall 23/40 (40) 16 Below Average    Retention Percentage 88 (48) 42 Average  Daily Living Memory       Immediate Recall 49/51 (73) 99 Exceptionally High    Delayed Recall 15/17 (57) 75 Above Average     Retention Percentage 88 (52) 58 Average    Recognition Hits 9/10 (53) 62 Average       Attention/Executive Function:     Oral Trail Making Test (OTMT): Raw Score (Z-Score) Percentile     Part A 5 secs.,  0 errors (1) 84 Above Average    Part B 33 secs.,  1 error (0.42) 66 Average       Symbol Digit Modalities Test (SDMT): Raw Score (Z-Score) Percentile     Oral 44 (-0.65) 26 Average        Scaled Score Percentile   WAIS-5 Naming Speed Quantity: 11 63 Average        Scaled Score Percentile  WAIS-5 Digits Forwards: 7 16 Below Average  WAIS-5 Digit Sequencing: 9 37 Average        Scaled Score Percentile   WAIS-5 Similarities: 10 50 Average  WAIS-5 Matrix Reasoning: 15 95 Well Above Average  WAIS-5 Figure Weights: 11 63 Average       D-KEFS Verbal Fluency Test: Raw Score (Scaled Score) Percentile     Letter Total Correct 36 (10) 50 Average    Category Total Correct 24 (6) 9 Below Average    Category Switching Total Correct 11 (9) 37 Average    Category Switching Accuracy 9 (8) 25 Average      Total Set Loss Errors 2 (10) 50 Average      Total Repetition Errors 1 (12) 75 Above Average       Language:     Verbal Fluency Test: Raw Score (T Score) Percentile     Phonemic Fluency (FAS) 36 (47) 38 Average    Animal Fluency 15 (45) 31 Average        NAB Language Module, Form 1: T Score Percentile     Naming 31/31 (58) 79 Above Average       Visuospatial/Visuoconstruction:      Raw Score Percentile   Clock Drawing: 10/10 --- Within Normal Limits  Hooper VOT: 26/30 --- Very Low Probability  of Impairment  Judgment of Line Orientation (JOLO),  Form H: 29/30 86 Above Average       NAB Spatial Module, Form 1: T Score Percentile     Figure Drawing Copy 72 99 Exceptionally High        Scaled Score Percentile   WAIS-5 Visual Puzzles: 9 37 Average       Mood and Personality:      Raw Score Percentile   Beck Depression Inventory - II: 12 --- Within Normal Limits  PROMIS  Anxiety Questionnaire: 19 --- Mild       Additional Questionnaires:      Raw Score Percentile   PROMIS Sleep Disturbance Questionnaire: 15 --- None to Slight   Informed Consent and Coding/Compliance:   The current evaluation represents a clinical evaluation for the purposes previously outlined by the referral source and is in no way reflective of a forensic evaluation.   Shaun Armstrong was provided with a verbal description of the nature and purpose of the present neuropsychological evaluation. Also reviewed were the foreseeable risks and/or discomforts and benefits of the procedure, limits of confidentiality, and mandatory reporting requirements of this provider. The patient was given the opportunity to ask questions and receive answers about the evaluation. Oral consent to participate was provided by the patient.   This evaluation was conducted by Arthea KYM Maryland, Ph.D., ABPP-CN, board certified clinical neuropsychologist. Mr. Cumbie completed a clinical interview with Dr. Maryland, billed as one unit [phone], and 150 minutes of cognitive testing and scoring, billed as one unit [phone] and four additional units [id number]. Psychometrist Lonell Jude, B.S. assisted Dr. Maryland with test administration and scoring procedures. As a separate and discrete service, one unit [phone] and two units 96133 (160 minutes) were billed for Dr. Loralee time spent in interpretation and report writing.

## 2024-06-08 ENCOUNTER — Telehealth: Payer: Self-pay | Admitting: *Deleted

## 2024-06-08 ENCOUNTER — Encounter: Payer: Self-pay | Admitting: Psychology

## 2024-06-08 NOTE — Telephone Encounter (Signed)
 Copied from CRM [id number]. Topic: General - Other >> Jun 08, 2024  9:08 AM Rosina BIRCH wrote: Reason for CRM: Tiffany from [facility] gastroenterology called wanting to know if we received a clearance for xarelto  for the patient. Tiffany stated she will refax it CB [phone]  Left detailed message on Tiffany's voicemail, clearance for Xarelto  form was faxed over today. Any questions please call office.

## 2024-06-09 NOTE — Telephone Encounter (Signed)
 Pt is returning a call to [facility]  left a VM

## 2024-06-09 NOTE — Telephone Encounter (Signed)
 Pt called in and left a message. He is returning Chelsea's call

## 2024-06-14 ENCOUNTER — Other Ambulatory Visit: Payer: Self-pay

## 2024-06-14 ENCOUNTER — Ambulatory Visit: Admitting: Psychology

## 2024-06-14 DIAGNOSIS — F1090 Alcohol use, unspecified, uncomplicated: Secondary | ICD-10-CM

## 2024-06-14 DIAGNOSIS — D329 Benign neoplasm of meninges, unspecified: Secondary | ICD-10-CM

## 2024-06-14 DIAGNOSIS — G250 Essential tremor: Secondary | ICD-10-CM

## 2024-06-14 DIAGNOSIS — R413 Other amnesia: Secondary | ICD-10-CM | POA: Diagnosis not present

## 2024-06-14 MED ORDER — BISACODYL 5 MG PO TBEC
DELAYED_RELEASE_TABLET | ORAL | 0 refills | Status: DC
  Filled 2024-06-14: qty 4, 1d supply, fill #0

## 2024-06-14 MED ORDER — PEG 3350-KCL-NA BICARB-NACL 420 G PO SOLR
ORAL | 0 refills | Status: DC
  Filled 2024-06-14: qty 4000, 1d supply, fill #0

## 2024-06-14 NOTE — Progress Notes (Signed)
   Neuropsychology Feedback Session Jolynn DEL. [facility] Department of Neurology  Reason for Referral:   Shaun Armstrong is a 73 y.o. right-handed Caucasian male referred by Asberry Schneider, D.O., to characterize his current cognitive functioning and assist with diagnostic clarity and treatment planning in the context of essential tremor and subjective cognitive decline.   Feedback:   Shaun Armstrong completed a comprehensive neuropsychological evaluation on 06/07/2024. Please refer to that encounter for the full report and recommendations. Briefly, results suggested performance variability across delayed retrieval aspects of memory. Outside of this, all other assessed domains were appropriate relative to age-matched peers. Trouble with information retrieval on demand is a fairly nonspecific finding. This can certainly be seen in individuals with essential tremor, as well as those with prior alcohol abuse/dependence concerns and ongoing alcohol consumption (he reported likely 1-2 beers daily). Chronic medical conditions surrounding cholesterol concerns and thyroid  dysfunction can also create memory inefficiencies. There also could be a mild contribution from chronic microvascular ischemic disease, as well as his suspected frontal meningioma. Overall, the combination of these factors would seem the most likely cause for testing performances and subjective day-to-day deficits. Despite memory variability, testing patterns as a whole do not suggest underlying Alzheimer's disease in a compelling fashion at the present time.  Shaun Armstrong was accompanied by his wife during the current feedback session. Content of the current session focused on the results of his neuropsychological evaluation. Shaun Armstrong was given the opportunity to ask questions and his questions were answered. He was encouraged to reach out should additional questions arise. A copy of his report was provided at the conclusion of the visit.       One unit [id number] (31 minutes) was billed for Dr. Loralee time spent preparing for, conducting, and documenting the current feedback session with Shaun Armstrong.

## 2024-06-21 ENCOUNTER — Other Ambulatory Visit: Payer: Self-pay

## 2024-06-24 ENCOUNTER — Other Ambulatory Visit

## 2024-06-24 ENCOUNTER — Ambulatory Visit
Admission: RE | Admit: 2024-06-24 | Discharge: 2024-06-24 | Disposition: A | Discharge status: Home / Self Care | Source: Ambulatory Visit | Attending: Neurology | Admitting: Neurology

## 2024-06-24 DIAGNOSIS — Z9889 Other specified postprocedural states: Secondary | ICD-10-CM | POA: Diagnosis not present

## 2024-06-24 DIAGNOSIS — D320 Benign neoplasm of cerebral meninges: Secondary | ICD-10-CM | POA: Diagnosis not present

## 2024-06-24 DIAGNOSIS — D329 Benign neoplasm of meninges, unspecified: Secondary | ICD-10-CM

## 2024-06-24 MED ORDER — GADOPICLENOL 0.5 MMOL/ML IV SOLN
8.5000 mL | Freq: Once | INTRAVENOUS | Status: AC | PRN
  Administered 2024-06-24: 8.5 mL via INTRAVENOUS

## 2024-06-28 ENCOUNTER — Ambulatory Visit: Payer: Self-pay | Admitting: Neurology

## 2024-06-30 DIAGNOSIS — F1020 Alcohol dependence, uncomplicated: Secondary | ICD-10-CM | POA: Diagnosis not present

## 2024-06-30 DIAGNOSIS — F331 Major depressive disorder, recurrent, moderate: Secondary | ICD-10-CM | POA: Diagnosis not present

## 2024-07-01 ENCOUNTER — Other Ambulatory Visit

## 2024-07-01 ENCOUNTER — Other Ambulatory Visit: Payer: Self-pay

## 2024-07-04 DIAGNOSIS — K5140 Inflammatory polyps of colon without complications: Secondary | ICD-10-CM | POA: Diagnosis not present

## 2024-07-04 DIAGNOSIS — K5730 Diverticulosis of large intestine without perforation or abscess without bleeding: Secondary | ICD-10-CM | POA: Diagnosis not present

## 2024-07-04 DIAGNOSIS — K5289 Other specified noninfective gastroenteritis and colitis: Secondary | ICD-10-CM | POA: Diagnosis not present

## 2024-07-04 DIAGNOSIS — Z09 Encounter for follow-up examination after completed treatment for conditions other than malignant neoplasm: Secondary | ICD-10-CM | POA: Diagnosis not present

## 2024-07-04 DIAGNOSIS — K5010 Crohn's disease of large intestine without complications: Secondary | ICD-10-CM | POA: Diagnosis not present

## 2024-07-04 DIAGNOSIS — Z860101 Personal history of adenomatous and serrated colon polyps: Secondary | ICD-10-CM | POA: Diagnosis not present

## 2024-07-06 DIAGNOSIS — K5289 Other specified noninfective gastroenteritis and colitis: Secondary | ICD-10-CM | POA: Diagnosis not present

## 2024-07-06 DIAGNOSIS — K5140 Inflammatory polyps of colon without complications: Secondary | ICD-10-CM | POA: Diagnosis not present

## 2024-07-08 NOTE — Progress Notes (Signed)
 Called patient and left voicemail message.

## 2024-07-22 DIAGNOSIS — L720 Epidermal cyst: Secondary | ICD-10-CM | POA: Diagnosis not present

## 2024-07-22 DIAGNOSIS — L84 Corns and callosities: Secondary | ICD-10-CM | POA: Diagnosis not present

## 2024-07-22 DIAGNOSIS — L570 Actinic keratosis: Secondary | ICD-10-CM | POA: Diagnosis not present

## 2024-07-22 DIAGNOSIS — Z85820 Personal history of malignant melanoma of skin: Secondary | ICD-10-CM | POA: Diagnosis not present

## 2024-07-22 DIAGNOSIS — L821 Other seborrheic keratosis: Secondary | ICD-10-CM | POA: Diagnosis not present

## 2024-07-22 DIAGNOSIS — D1801 Hemangioma of skin and subcutaneous tissue: Secondary | ICD-10-CM | POA: Diagnosis not present

## 2024-07-22 DIAGNOSIS — D485 Neoplasm of uncertain behavior of skin: Secondary | ICD-10-CM | POA: Diagnosis not present

## 2024-07-22 DIAGNOSIS — L820 Inflamed seborrheic keratosis: Secondary | ICD-10-CM | POA: Diagnosis not present

## 2024-07-22 DIAGNOSIS — D171 Benign lipomatous neoplasm of skin and subcutaneous tissue of trunk: Secondary | ICD-10-CM | POA: Diagnosis not present

## 2024-07-22 DIAGNOSIS — D2272 Melanocytic nevi of left lower limb, including hip: Secondary | ICD-10-CM | POA: Diagnosis not present

## 2024-07-27 ENCOUNTER — Other Ambulatory Visit: Payer: Self-pay

## 2024-07-28 DIAGNOSIS — F1020 Alcohol dependence, uncomplicated: Secondary | ICD-10-CM | POA: Diagnosis not present

## 2024-07-28 DIAGNOSIS — F331 Major depressive disorder, recurrent, moderate: Secondary | ICD-10-CM | POA: Diagnosis not present

## 2024-08-01 NOTE — Progress Notes (Signed)
 08/04/24- 73 yoM for sleep evaluation courtesy of Dr Asberry Tat with concern of suspected OSA Medical problem list includes Acoustic Neuroma, Meningioma, Hyperparathyroid, Memory Loss. Hx PE/ Chronic Xarelto , Epworth score- Body weight today-188 lbs Discussed the use of AI scribe software for clinical note transcription with the patient, who gave verbal consent to proceed.  History of Present Illness   Shaun Armstrong is a 73 year old male who presents with potential sleep apnea. He was referred by Dr. Tiburcio, his neurologist, for concerns about his breathing during sleep.  He does not perceive any issues with his sleep but acknowledges the possibility of being unaware of problems during sleep. He has never undergone a sleep study and has not had any surgeries on his nose or throat, except for a tonsillectomy in childhood. He sometimes experiences daytime fatigue. He does consider that he sleeps too much. Coffee- 2-3 cups. No sleep meds.   His sister reports that he snores, particularly when sleeping on his back. There is a family history of snoring. He quit smoking many years ago. ENT surgery for tonsils and thyroid .     Prior to Admission medications   Medication Sig Start Date End Date Taking? Authorizing Provider  Cholecalciferol (VITAMIN D ) 125 MCG (5000 UT) CAPS Take 1 capsule by mouth once a week.   Yes [provider]  glucosamine-chondroitin 500-400 MG tablet Take 1 tablet by mouth daily. With msm   Yes [provider]  mesalamine  (LIALDA ) 1.2 g EC tablet Take 2 tablets (2.4 g total) by mouth daily. 10/08/23  Yes   Multiple Vitamin (MULTIVITAMIN) tablet Take 1 tablet by mouth daily.   Yes [provider]  Omega-3 Fatty Acids (FISH OIL) 1000 MG CAPS Take 2,000 mg by mouth daily.   Yes [provider]  Polyethyl Glycol-Propyl Glycol (SYSTANE FREE OP) Place 2 drops into both eyes 5 (five) times daily as needed (dry eyes).   Yes [provider]   propranolol  (INDERAL ) 20 MG tablet Take 2 tablets (40 mg total) by mouth daily. 05/26/24  Yes [place] CHRISTELLA, MD  rivaroxaban  (XARELTO ) 20 MG TABS tablet Take 1 tablet (20 mg total) by mouth daily with supper. 05/23/24  Yes [place] CHRISTELLA, MD  rosuvastatin  (CRESTOR ) 40 MG tablet Take 1 tablet (40 mg total) by mouth daily. 09/29/23  Yes [place] CHRISTELLA, MD  sertraline  (ZOLOFT ) 25 MG tablet Take 1 tablet (25 mg total) by mouth daily. 08/03/24  Yes   Turmeric 500 MG TABS Take by mouth.   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   Decreased peripheral vision 11/04/2011   Elevated alcohol use    Essential tremor    Gastroesophageal reflux disease without esophagitis 10/25/2018   Hyperlipidemia 10/05/2012   Hyperparathyroidism, primary 06/07/2020   Inflammatory polyps of colon without complications    Inguinal hernia 07/2016   Memory dysfunction 06/07/2024   Meningioma 06/07/2024   14 mm; right frontal convexity    Meralgia paresthetica of right side 10/25/2018   Regional enteritis of small intestine with large intestine    Retinal detachment  S/P parathyroidectomy 05/07/2022   Sun-damaged skin 08/03/2017   Wears hearing aid, left ear    Past Surgical History:  Procedure Laterality Date   ACOUSTIC NEUROMA RESECTION Right    BROW LIFT AND BLEPHAROPLASTY Bilateral 10/11/2012   CATARACT EXTRACTION W/ INTRAOCULAR LENS  IMPLANT, BILATERAL Bilateral    HERNIA REPAIR     INGUINAL HERNIA REPAIR Right 07/16/2016   Procedure: LAPAROSCOPIC RIGHT INGUINAL HERNIA;  Surgeon: Vicenta Poli, MD;  Location:  [facility];  Service: General;  Laterality: Right;   INSERTION OF MESH Right 07/16/2016   Procedure: INSERTION OF MESH;  Surgeon: Vicenta Poli, MD;  Location: [facility];  Service: General;  Laterality: Right;   MELANOMA EXCISION  01/2022   scalp   MOHS SURGERY N/A 11/05/2021   scalp   PARATHYROIDECTOMY Left 05/07/2022   Procedure: MINIMALLY INVASIVE LEFT INFERIOR PARATHYROIDECTOMY;  Surgeon: Rubin Calamity, MD;  Location: [facility] OR;  Service: General;  Laterality: Left;   RETINAL DETACHMENT SURGERY Left    TONSILLECTOMY     Family History  Problem Relation Age of Onset   Diabetes Mother    Dementia Mother    Tremor Sister    Diabetes Sister    Tremor Half-Sister    Tremor Half-Sister    Hyperparathyroidism Neg Hx    Social History   Socioeconomic History   Marital status: Married    Spouse name: Toby   Number of children: 1   Years of education: 13   Highest education level: Some college, no degree  Occupational History   Occupation: Retired    Comment: Psychologist, sport and exercise; Soil scientist  Tobacco Use   Smoking status: Former    Current packs/day: 0.00    Average packs/day: 1 pack/day for 4.0 years (4.0 ttl pk-yrs)    Types: Cigarettes    Start date: 12/09/1975    Quit date: 12/09/1979    Years since quitting: 44.6   Smokeless tobacco: Never   Tobacco comments:    Quit 38 years ago  Vaping Use   Vaping status: Some Days  Substance and Sexual Activity   Alcohol use: Yes    Alcohol/week: 14.0 standard drinks of alcohol    Types: 14 Cans of beer per week   Drug use: No   Sexual activity: Yes  Other Topics Concern   Not on file  Social History Narrative   Right handed    Social Drivers of Health   Financial Resource Strain: Low Risk  (02/11/2024)   Overall Financial Resource Strain (CARDIA)    Difficulty of Paying Living Expenses: Not hard at all  Food Insecurity: No Food Insecurity (02/11/2024)   Hunger Vital Sign    Worried About Running  Out of Food in the Last Year: Never true    Ran Out of Food in the Last Year: Never true  Transportation Needs: No Transportation Needs (02/11/2024)   PRAPARE - Administrator, Civil Service (Medical): No    Lack of Transportation (Non-Medical): No  Physical Activity: Insufficiently Active (02/11/2024)   Exercise Vital Sign    Days of Exercise per Week: 6 days    Minutes of Exercise per Session: 20 min  Stress: No Stress Concern Present (02/11/2024)   [employer] of Occupational Health - Occupational Stress Questionnaire    Feeling of Stress : Only a little  Social Connections: Socially Integrated (02/11/2024)   Social Connection and Isolation Panel    Frequency of Communication with Friends and Family: More than three  times a week    Frequency of Social Gatherings with Friends and Family: More than three times a week    Attends Religious Services: 1 to 4 times per year    Active Member of [employer] or Organizations: Yes    Attends Banker Meetings: 1 to 4 times per year    Marital Status: Married  Catering manager Violence: Not At Risk (02/11/2024)   Humiliation, Afraid, Rape, and Kick questionnaire    Fear of Current or Ex-Partner: No    Emotionally Abused: No    Physically Abused: No    Sexually Abused: No   Assessment and Plan:    Suspected obstructive sleep apnea with associated daytime sleepiness and snoring Suspected obstructive sleep apnea due to snoring, daytime sleepiness, and family history. Explained potential health impacts and treatment options, including CPAP, dental devices, and surgery. - Order home sleep test. - Instruct him to contact office for results. - Discuss treatment options if confirmed.       ROS-see HPI   + = positive Constitutional:    weight loss, night sweats, fevers, chills, fatigue, lassitude. HEENT:    headaches, difficulty swallowing, tooth/dental problems, sore throat,       sneezing, itching, ear ache, nasal congestion,  post nasal drip, snoring CV:    chest pain, orthopnea, PND, swelling in lower extremities, anasarca,                                  dizziness, palpitations Resp:   shortness of breath with exertion or at rest.                productive cough,   non-productive cough, coughing up of blood.              change in color of mucus.  wheezing.   Skin:    rash or lesions. GI:  No-   heartburn, indigestion, abdominal pain, nausea, vomiting, diarrhea,                 change in bowel habits, loss of appetite GU: dysuria, change in color of urine, no urgency or frequency.   flank pain. MS:   joint pain, stiffness, decreased range of motion, back pain. Neuro-     nothing unusual Psych:  change in mood or affect.  depression or anxiety.   memory loss.  OBJ- Physical Exam General- Alert, Oriented, Affect-appropriate, Distress- none acute Skin- rash-none, lesions- none, excoriation- none Lymphadenopathy- none Head- atraumatic            Eyes- Gross vision intact, PERRLA, conjunctivae and secretions clear            Ears- Hearing, canals-normal            Nose- Clear, no-Septal dev, mucus, polyps, erosion, perforation             Throat- Mallampati III , mucosa clear , drainage- none, tonsils- atrophic Neck- flexible , trachea midline, no stridor , thyroid  nl, carotid no bruit Chest - symmetrical excursion , unlabored           Heart/CV- RRR , no murmur , no gallop  , no rub, nl s1 s2                           - JVD- none , edema- none, stasis changes- none, varices- none  Lung- clear to P&A, wheeze- none, cough- none , dullness-none, rub- none           Chest wall-  Abd-  Br/ Gen/ Rectal- Not done, not indicated Extrem- cyanosis- none, clubbing, none, atrophy- none, strength- nl Neuro- grossly intact to observation

## 2024-08-03 ENCOUNTER — Other Ambulatory Visit (HOSPITAL_BASED_OUTPATIENT_CLINIC_OR_DEPARTMENT_OTHER): Payer: Self-pay

## 2024-08-03 DIAGNOSIS — F1020 Alcohol dependence, uncomplicated: Secondary | ICD-10-CM | POA: Diagnosis not present

## 2024-08-03 DIAGNOSIS — F331 Major depressive disorder, recurrent, moderate: Secondary | ICD-10-CM | POA: Diagnosis not present

## 2024-08-03 MED ORDER — SERTRALINE HCL 25 MG PO TABS
25.0000 mg | ORAL_TABLET | Freq: Every day | ORAL | 0 refills | Status: DC
  Filled 2024-08-03 – 2024-08-30 (×2): qty 90, 90d supply, fill #0

## 2024-08-04 ENCOUNTER — Encounter: Payer: Self-pay | Admitting: Internal Medicine

## 2024-08-04 ENCOUNTER — Ambulatory Visit: Admitting: Internal Medicine

## 2024-08-04 ENCOUNTER — Other Ambulatory Visit: Payer: Self-pay

## 2024-08-04 VITALS — BP 110/74 | HR 62 | Temp 97.9°F | Ht 69.0 in | Wt 188.2 lb

## 2024-08-04 DIAGNOSIS — Z87891 Personal history of nicotine dependence: Secondary | ICD-10-CM | POA: Diagnosis not present

## 2024-08-04 DIAGNOSIS — R0683 Snoring: Secondary | ICD-10-CM

## 2024-08-04 DIAGNOSIS — G4710 Hypersomnia, unspecified: Secondary | ICD-10-CM

## 2024-08-04 NOTE — Patient Instructions (Signed)
Order- schedule home sleep test dx snoring  Please call us about 2 weeks after your sleep test for results and recommendations 

## 2024-08-05 ENCOUNTER — Other Ambulatory Visit (HOSPITAL_BASED_OUTPATIENT_CLINIC_OR_DEPARTMENT_OTHER): Payer: Self-pay

## 2024-08-05 ENCOUNTER — Other Ambulatory Visit: Payer: Self-pay

## 2024-08-08 ENCOUNTER — Encounter: Payer: Self-pay | Admitting: Internal Medicine

## 2024-08-11 DIAGNOSIS — F331 Major depressive disorder, recurrent, moderate: Secondary | ICD-10-CM | POA: Diagnosis not present

## 2024-08-11 DIAGNOSIS — F1020 Alcohol dependence, uncomplicated: Secondary | ICD-10-CM | POA: Diagnosis not present

## 2024-08-15 ENCOUNTER — Encounter

## 2024-08-15 DIAGNOSIS — R0683 Snoring: Secondary | ICD-10-CM

## 2024-08-23 ENCOUNTER — Telehealth: Payer: Self-pay

## 2024-08-23 ENCOUNTER — Other Ambulatory Visit: Payer: Self-pay

## 2024-08-23 ENCOUNTER — Other Ambulatory Visit (HOSPITAL_BASED_OUTPATIENT_CLINIC_OR_DEPARTMENT_OTHER): Payer: Self-pay

## 2024-08-23 NOTE — Telephone Encounter (Addendum)
 Pharmacy Quality Measure Review  This patient is appearing on a report for being at risk of failing the adherence measure for cholesterol (statin) medications this calendar year.   Medication: Rosuvastatin  Last fill date: 05/31/24 for 90 days.  Left voicemail for patient to return my call at their convenience.

## 2024-08-25 DIAGNOSIS — F1020 Alcohol dependence, uncomplicated: Secondary | ICD-10-CM | POA: Diagnosis not present

## 2024-08-25 DIAGNOSIS — F331 Major depressive disorder, recurrent, moderate: Secondary | ICD-10-CM | POA: Diagnosis not present

## 2024-08-26 ENCOUNTER — Other Ambulatory Visit: Payer: Self-pay

## 2024-08-26 ENCOUNTER — Other Ambulatory Visit: Payer: Self-pay | Admitting: Family Medicine

## 2024-08-26 ENCOUNTER — Other Ambulatory Visit (HOSPITAL_COMMUNITY): Payer: Self-pay

## 2024-08-29 ENCOUNTER — Other Ambulatory Visit (HOSPITAL_COMMUNITY): Payer: Self-pay

## 2024-08-29 ENCOUNTER — Other Ambulatory Visit: Payer: Self-pay

## 2024-08-29 DIAGNOSIS — G4733 Obstructive sleep apnea (adult) (pediatric): Secondary | ICD-10-CM | POA: Diagnosis not present

## 2024-08-30 ENCOUNTER — Other Ambulatory Visit (HOSPITAL_COMMUNITY): Payer: Self-pay

## 2024-08-30 ENCOUNTER — Other Ambulatory Visit: Payer: Self-pay

## 2024-08-30 NOTE — Telephone Encounter (Signed)
 Quality Adherence Measure Review This patient has appeared in a report as failing or at risk of failing a medication adherence for Cholesterol measure in 2025 fiscal year.   PDC on report: 0.72 The 10-year ASCVD risk score (Arnett DK, et al., 2019) is: 22.4%   Values used to calculate the score:     Age: 73 years     Clincally relevant sex: Male     Is Non-Hispanic African American: No     Diabetic: No     Tobacco smoker: Yes     Systolic Blood Pressure: 110 mmHg     Is BP treated: No     HDL Cholesterol: 36 mg/dL     Total Cholesterol: 194 mg/dL  Lab Results  Component Value Date   LDLCALC 113 (H) 09/22/2023    Barriers to adherence identified:  [] Cost concerns [] Transportation barriers [x] Assistance needed to obtain refills (> 5 maintenance medications or multiple pharmacies) [] One/some of medications make them feel poorly [] Questions or concerns about their medications [] Patient reports forgetting to take medication [] Other: N/A   Comments: Patient was due for multiple medication refills. Assisted in processing refills.   Community pharmacist intervention?: Yes [x] Refilled targeted medication [] Enrolled in automatic refill [] Enrolled in mail delivery [] Enrolled in adherence therapy packaging (ATP) [] Enrolled in medication synchronization [] Transferred prescriptions form other pharmacies [x] Other: Refilled propranolol , sertraline , and Xarelto .  Clinical intervention indicated? No [] Pharmacotherapy dose escalation  [] Pharmacotherapy dose decrease [] Initiation of new pharmacotherapy  [] Pharmacotherapy discontinued  [] Refill for targeted medication requested [] Enrollment in patient assistance program [] Other: N/A  CPP or PCP notified? No  Follow-up Timeline: 11/08/2024 before next refill.

## 2024-08-31 ENCOUNTER — Other Ambulatory Visit (HOSPITAL_BASED_OUTPATIENT_CLINIC_OR_DEPARTMENT_OTHER): Payer: Self-pay

## 2024-08-31 ENCOUNTER — Encounter: Payer: Self-pay | Admitting: Pharmacist

## 2024-08-31 ENCOUNTER — Other Ambulatory Visit: Payer: Self-pay

## 2024-08-31 MED ORDER — COMIRNATY 30 MCG/0.3ML IM SUSY
0.3000 mL | PREFILLED_SYRINGE | Freq: Once | INTRAMUSCULAR | 0 refills | Status: AC
  Filled 2024-08-31: qty 0.3, 1d supply, fill #0

## 2024-08-31 MED ORDER — FLUZONE HIGH-DOSE 0.5 ML IM SUSY
0.5000 mL | PREFILLED_SYRINGE | Freq: Once | INTRAMUSCULAR | 0 refills | Status: AC
  Filled 2024-08-31: qty 0.5, 1d supply, fill #0

## 2024-08-31 NOTE — Progress Notes (Signed)
 Pharmacy Quality Measure Review  This patient is appearing on a report for being at risk of failing the adherence measure for cholesterol (statin) medications this calendar year.   Medication: Rosuvastatin  40 mg  Last fill date: 08/30/24 for 90 day supply  Insurance report was not up to date. No action needed at this time.   Darrelyn Drum, PharmD, BCPS, CPP Clinical Pharmacist Practitioner [facility] [phone]

## 2024-09-01 ENCOUNTER — Other Ambulatory Visit (HOSPITAL_COMMUNITY): Payer: Self-pay

## 2024-09-02 ENCOUNTER — Other Ambulatory Visit (HOSPITAL_COMMUNITY): Payer: Self-pay

## 2024-09-05 ENCOUNTER — Other Ambulatory Visit (HOSPITAL_COMMUNITY): Payer: Self-pay

## 2024-09-06 ENCOUNTER — Other Ambulatory Visit (HOSPITAL_COMMUNITY): Payer: Self-pay

## 2024-09-08 DIAGNOSIS — F1020 Alcohol dependence, uncomplicated: Secondary | ICD-10-CM | POA: Diagnosis not present

## 2024-09-08 DIAGNOSIS — F331 Major depressive disorder, recurrent, moderate: Secondary | ICD-10-CM | POA: Diagnosis not present

## 2024-09-09 ENCOUNTER — Other Ambulatory Visit (HOSPITAL_COMMUNITY): Payer: Self-pay

## 2024-09-13 ENCOUNTER — Other Ambulatory Visit (HOSPITAL_COMMUNITY): Payer: Self-pay

## 2024-09-16 ENCOUNTER — Other Ambulatory Visit (HOSPITAL_COMMUNITY): Payer: Self-pay

## 2024-09-19 ENCOUNTER — Other Ambulatory Visit (HOSPITAL_COMMUNITY): Payer: Self-pay

## 2024-09-22 ENCOUNTER — Other Ambulatory Visit (HOSPITAL_COMMUNITY): Payer: Self-pay

## 2024-09-22 DIAGNOSIS — F1020 Alcohol dependence, uncomplicated: Secondary | ICD-10-CM | POA: Diagnosis not present

## 2024-09-22 DIAGNOSIS — F331 Major depressive disorder, recurrent, moderate: Secondary | ICD-10-CM | POA: Diagnosis not present

## 2024-09-23 ENCOUNTER — Encounter: Payer: Self-pay | Admitting: Family Medicine

## 2024-09-23 ENCOUNTER — Ambulatory Visit: Payer: PPO | Admitting: Family Medicine

## 2024-09-23 VITALS — BP 120/70 | HR 57 | Temp 97.4°F | Ht 69.0 in | Wt 194.6 lb

## 2024-09-23 DIAGNOSIS — K5080 Crohn's disease of both small and large intestine without complications: Secondary | ICD-10-CM | POA: Diagnosis not present

## 2024-09-23 DIAGNOSIS — Z7901 Long term (current) use of anticoagulants: Secondary | ICD-10-CM

## 2024-09-23 DIAGNOSIS — Z125 Encounter for screening for malignant neoplasm of prostate: Secondary | ICD-10-CM

## 2024-09-23 DIAGNOSIS — Z0001 Encounter for general adult medical examination with abnormal findings: Secondary | ICD-10-CM | POA: Diagnosis not present

## 2024-09-23 DIAGNOSIS — Z131 Encounter for screening for diabetes mellitus: Secondary | ICD-10-CM | POA: Diagnosis not present

## 2024-09-23 DIAGNOSIS — E785 Hyperlipidemia, unspecified: Secondary | ICD-10-CM

## 2024-09-23 DIAGNOSIS — F332 Major depressive disorder, recurrent severe without psychotic features: Secondary | ICD-10-CM

## 2024-09-23 DIAGNOSIS — G250 Essential tremor: Secondary | ICD-10-CM | POA: Diagnosis not present

## 2024-09-23 DIAGNOSIS — E210 Primary hyperparathyroidism: Secondary | ICD-10-CM | POA: Diagnosis not present

## 2024-09-23 LAB — COMPREHENSIVE METABOLIC PANEL WITH GFR
ALT: 36 U/L (ref 0–53)
AST: 33 U/L (ref 0–37)
Albumin: 4.4 g/dL (ref 3.5–5.2)
Alkaline Phosphatase: 53 U/L (ref 39–117)
BUN: 19 mg/dL (ref 6–23)
CO2: 27 meq/L (ref 19–32)
Calcium: 9.3 mg/dL (ref 8.4–10.5)
Chloride: 106 meq/L (ref 96–112)
Creatinine, Ser: 1 mg/dL (ref 0.40–1.50)
GFR: 74.31 mL/min (ref >60.00)
Glucose, Bld: 92 mg/dL (ref 70–99)
Potassium: 4.3 meq/L (ref 3.5–5.1)
Sodium: 143 meq/L (ref 135–145)
Total Bilirubin: 0.7 mg/dL (ref 0.2–1.2)
Total Protein: 6.5 g/dL (ref 6.0–8.3)

## 2024-09-23 LAB — CBC
HCT: 45.6 % (ref 39.0–52.0)
Hemoglobin: 15 g/dL (ref 13.0–17.0)
MCHC: 33 g/dL (ref 30.0–36.0)
MCV: 88.2 fl (ref 78.0–100.0)
Platelets: 176 K/uL (ref 150.0–400.0)
RBC: 5.16 Mil/uL (ref 4.22–5.81)
RDW: 14.2 % (ref 11.5–15.5)
WBC: 6 K/uL (ref 4.0–10.5)

## 2024-09-23 LAB — VITAMIN B12: Vitamin B-12: 304 pg/mL (ref 211–911)

## 2024-09-23 LAB — LIPID PANEL
Cholesterol: 162 mg/dL (ref 0–200)
HDL: 35.4 mg/dL — ABNORMAL LOW (ref >39.00)
LDL Cholesterol: 107 mg/dL — ABNORMAL HIGH (ref 0–99)
NonHDL: 126.13
Total CHOL/HDL Ratio: 5
Triglycerides: 95 mg/dL (ref 0.0–149.0)
VLDL: 19 mg/dL (ref 0.0–40.0)

## 2024-09-23 LAB — HEMOGLOBIN A1C: Hgb A1c MFr Bld: 5.8 % (ref 4.6–6.5)

## 2024-09-23 LAB — PSA: PSA: 3.62 ng/mL (ref 0.10–4.00)

## 2024-09-23 LAB — TSH: TSH: 2.35 u[IU]/mL (ref 0.35–5.50)

## 2024-09-23 NOTE — Assessment & Plan Note (Signed)
 Check lipids with labs.

## 2024-09-23 NOTE — Assessment & Plan Note (Signed)
 Follows with endocrinology.  Check labs today.

## 2024-09-23 NOTE — Assessment & Plan Note (Signed)
 Follows with psychiatry.  On Zoloft  25 mg daily.

## 2024-09-23 NOTE — Progress Notes (Signed)
 Chief Complaint:  Shaun Armstrong is a 73 y.o. male who presents today for his annual comprehensive physical exam.    Assessment/Plan:  New/Acute Problems: Assessment & Plan Subungual foreign body   A foreign body under his left thumbnail is currently asymptomatic.  We discussed treatment options including watchful waiting versus excision and removal.  He elected to continue with watchful waiting for now.  Anticipate naturally push out over the next several weeks to months.  He will let us  know if he needs area to be excised.   Chronic Problems Addressed Today: Essential tremor Follows with neurology.  On propranolol  20 mg twice daily.  Hyperlipidemia Check lipids with labs.  Major depressive disorder, recurrent severe without psychotic features (HCC) Follows with psychiatry.  On Zoloft  25 mg daily.  Chronic anticoagulation due to history of PE Anticoagulated on Xarelto  20 mg daily due to history of PE.  Check labs today.  Hyperparathyroidism, primary Follows with endocrinology.  Check labs today.  Crohn's disease of both small and large intestine without complication Continue management per GI.  He is on Lialda  2.4 g daily    Preventative Healthcare: Check labs.  Pneumonia vaccine declined.  Declined shingles vaccine though he can get this at the pharmacy.  He is up-to-date on his colonoscopy.  Patient Counseling(The following topics were reviewed and/or handout was given):  -Nutrition: Stressed importance of moderation in sodium/caffeine intake, saturated fat and cholesterol, caloric balance, sufficient intake of fresh fruits, vegetables, and fiber.  -Stressed the importance of regular exercise.   -Substance Abuse: Discussed cessation/primary prevention of tobacco, alcohol, or other drug use; driving or other dangerous activities under the influence; availability of treatment for abuse.   -Injury prevention: Discussed safety belts, safety helmets, smoke detector, smoking near  bedding or upholstery.   -Sexuality: Discussed sexually transmitted diseases, partner selection, use of condoms, avoidance of unintended pregnancy and contraceptive alternatives.   -Dental health: Discussed importance of regular tooth brushing, flossing, and dental visits.  -Health maintenance and immunizations reviewed. Please refer to Health maintenance section.  Return to care in 1 year for next preventative visit.     Subjective:  HPI:  He has no acute complaints today. Patient is here today for his annual physical.  See assessment / plan for status of chronic conditions.  Discussed the use of AI scribe software for clinical note transcription with the patient, who gave verbal consent to proceed.  History of Present Illness Shaun Armstrong is a 73 year old male who presents for an annual physical exam.  He has a concern regarding a foreign body under his left thumbnail, present for about two months. Initially, it was painful, but the pain has subsided, and it has not bothered him for the past couple of months. He has been unable to remove it with tweezers.  He has a history of tremors and was previously on medication for this condition. He was taken off the medication but has since resumed taking propranolol . He has not been able to return to his previous state of control over the tremors. An MRI a few months ago revealed a small meningioma.  He has been focusing on diet and exercise, including walking and doing sit-ups regularly. His wife also participates in exercise, which helps him stay motivated.  He has not had recent blood work done; the last recorded tests were in May. He is unsure if he has had any tests since then. He has received his flu shot and colonoscopy but  declined the pneumonia and shingles vaccines.  Regarding his vision and hearing, his vision is okay, and he uses crossover hearing aids, which seem to work well for him.       05/03/2024   12:10 PM  Depression  screen PHQ 2/9  Decreased Interest 0  Down, Depressed, Hopeless 1  PHQ - 2 Score 1  Altered sleeping 2  Tired, decreased energy 1  Change in appetite 2  Feeling bad or failure about yourself  0  Trouble concentrating 0  Moving slowly or fidgety/restless 1  Suicidal thoughts 0  PHQ-9 Score 7  Difficult doing work/chores Not difficult at all    Health Maintenance Due  Topic Date Due   Zoster Vaccines- Shingrix (1 of 2) Never done     ROS: Per HPI, otherwise a complete review of systems was negative.   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   Decreased peripheral vision 11/04/2011   Elevated alcohol use    Essential tremor    Gastroesophageal reflux disease without esophagitis 10/25/2018   Hyperlipidemia 10/05/2012   Hyperparathyroidism, primary 06/07/2020   Inflammatory polyps of colon without complications    Inguinal hernia 07/2016   Memory dysfunction 06/07/2024   Meningioma 06/07/2024   14 mm; right frontal convexity    Meralgia paresthetica of right side 10/25/2018   Regional enteritis of small intestine with large intestine    Retinal detachment    S/P parathyroidectomy 05/07/2022   Sun-damaged skin 08/03/2017   Wears hearing aid, left ear    Patient Active Problem List   Diagnosis Date Noted   Major depressive disorder, recurrent severe without psychotic features (HCC) 09/23/2024   Chronic anticoagulation due to history of PE 09/23/2024   Memory dysfunction 06/07/2024   Meningioma 06/07/2024   Elevated alcohol use    Abnormal liver  enzymes    Daytime somnolence 09/22/2023   S/P parathyroidectomy 05/07/2022   Hyperparathyroidism, primary 06/07/2020   Gastroesophageal reflux disease without esophagitis 10/25/2018   Sun-damaged skin 08/03/2017   Essential tremor 06/05/2016   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   Crohn's disease of both small and large intestine without complication 02/28/2014   Hyperlipidemia 10/05/2012   Decreased peripheral vision 11/04/2011   Past Surgical History:  Procedure Laterality Date   ACOUSTIC NEUROMA RESECTION Right    BROW LIFT AND BLEPHAROPLASTY Bilateral 10/11/2012   CATARACT EXTRACTION W/ INTRAOCULAR LENS  IMPLANT, BILATERAL Bilateral    HERNIA REPAIR     INGUINAL HERNIA REPAIR Right 07/16/2016   Procedure: LAPAROSCOPIC RIGHT INGUINAL HERNIA;  Surgeon: Vicenta Poli, MD;  Location: [facility];  Service: General;  Laterality: Right;   INSERTION OF MESH Right 07/16/2016   Procedure: INSERTION OF MESH;  Surgeon: Vicenta Poli, MD;  Location: [facility];  Service: General;  Laterality: Right;   MELANOMA EXCISION  01/2022   scalp   MOHS SURGERY N/A 11/05/2021   scalp   PARATHYROIDECTOMY Left 05/07/2022   Procedure: MINIMALLY INVASIVE LEFT INFERIOR PARATHYROIDECTOMY;  Surgeon: Rubin Calamity, MD;  Location: [facility] OR;  Service: General;  Laterality: Left;  RETINAL DETACHMENT SURGERY Left    TONSILLECTOMY      Family History  Problem Relation Age of Onset   Diabetes Mother    Dementia Mother    Tremor Sister    Diabetes Sister    Tremor Half-Sister    Tremor Half-Sister    Hyperparathyroidism Neg Hx     Medications- reviewed and updated Current Outpatient Medications  Medication Sig Dispense Refill   Cholecalciferol (VITAMIN D ) 125 MCG (5000 UT) CAPS Take 1 capsule by mouth once a week.     glucosamine-chondroitin 500-400 MG tablet Take 1 tablet by mouth daily. With msm     mesalamine  (LIALDA ) 1.2 g EC tablet Take 2 tablets (2.4 g total) by mouth  daily. 120 tablet 5   Multiple Vitamin (MULTIVITAMIN) tablet Take 1 tablet by mouth daily.     Omega-3 Fatty Acids (FISH OIL) 1000 MG CAPS Take 2,000 mg by mouth daily.     Polyethyl Glycol-Propyl Glycol (SYSTANE FREE OP) Place 2 drops into both eyes 5 (five) times daily as needed (dry eyes).     propranolol  (INDERAL ) 20 MG tablet Take 2 tablets (40 mg total) by mouth daily. 180 tablet 3   rivaroxaban  (XARELTO ) 20 MG TABS tablet Take 1 tablet (20 mg total) by mouth daily with supper. 30 tablet 11   rosuvastatin  (CRESTOR ) 40 MG tablet Take 1 tablet (40 mg total) by mouth daily. 90 tablet 3   sertraline  (ZOLOFT ) 25 MG tablet Take 1 tablet (25 mg total) by mouth daily. 90 tablet 0   Turmeric 500 MG TABS Take by mouth.     No current facility-administered medications for this visit.    Allergies-reviewed and updated Allergies  Allergen Reactions   Cefuroxime Other (See Comments)    Severe dehydration, not certain if this is true     Poison Ivy Extract Rash    Social History   Socioeconomic History   Marital status: Married    Spouse name: Toby   Number of children: 1   Years of education: 13   Highest education level: Some college, no degree  Occupational History   Occupation: Retired    Comment: Psychologist, sport and exercise; Soil scientist  Tobacco Use   Smoking status: Former    Current packs/day: 0.00    Average packs/day: 1 pack/day for 4.0 years (4.0 ttl pk-yrs)    Types: Cigarettes    Start date: 12/09/1975    Quit date: 12/09/1979    Years since quitting: 44.8   Smokeless tobacco: Never   Tobacco comments:    Quit 38 years ago  Vaping Use   Vaping status: Some Days  Substance and Sexual Activity   Alcohol use: Yes    Alcohol/week: 14.0 standard drinks of alcohol    Types: 14 Cans of beer per week   Drug use: No   Sexual activity: Yes  Other Topics Concern   Not on file  Social History Narrative   Right handed    Social Drivers of Health   Financial Resource Strain: Low  Risk  (02/11/2024)   Overall Financial Resource Strain (CARDIA)    Difficulty of Paying Living Expenses: Not hard at all  Food Insecurity: No Food Insecurity (02/11/2024)   Hunger Vital Sign    Worried About Running Out of Food in the Last Year: Never true    Ran Out of Food in the Last Year: Never true  Transportation Needs: No Transportation Needs (02/11/2024)   PRAPARE - Transportation    Lack of  Transportation (Medical): No    Lack of Transportation (Non-Medical): No  Physical Activity: Insufficiently Active (02/11/2024)   Exercise Vital Sign    Days of Exercise per Week: 6 days    Minutes of Exercise per Session: 20 min  Stress: No Stress Concern Present (02/11/2024)   [employer] of Occupational Health - Occupational Stress Questionnaire    Feeling of Stress : Only a little  Social Connections: Socially Integrated (02/11/2024)   Social Connection and Isolation Panel    Frequency of Communication with Friends and Family: More than three times a week    Frequency of Social Gatherings with Friends and Family: More than three times a week    Attends Religious Services: 1 to 4 times per year    Active Member of [employer] or Organizations: Yes    Attends Banker Meetings: 1 to 4 times per year    Marital Status: Married        Objective:  Physical Exam: BP 120/70   Pulse (!) 57   Temp (!) 97.4 F (36.3 C) (Temporal)   Ht 5' 9 (1.753 m)   Wt 194 lb 9.6 oz (88.3 kg)   SpO2 95%   BMI 28.74 kg/m   Body mass index is 28.74 kg/m. Wt Readings from Last 3 Encounters:  09/23/24 194 lb 9.6 oz (88.3 kg)  08/04/24 188 lb 3.2 oz (85.4 kg)  05/26/24 184 lb 1.6 oz (83.5 kg)   Gen: NAD, resting comfortably HEENT: TMs normal bilaterally. OP clear. No thyromegaly noted.  CV: RRR with no murmurs appreciated Pulm: NWOB, CTAB with no crackles, wheezes, or rhonchi GI: Normal bowel sounds present. Soft, Nontender, Nondistended. MSK: no edema, cyanosis, or clubbing noted Skin: warm,  dry Neuro: CN2-12 grossly intact. Strength 5/5 in upper and lower extremities. Reflexes symmetric and intact bilaterally.  Psych: Normal affect and thought content     Maveryck Bahri M. Kennyth, MD 09/23/2024 10:00 AM

## 2024-09-23 NOTE — Patient Instructions (Signed)
 It was very nice to see you today!  VISIT SUMMARY: Today, you had your annual physical exam. We discussed a foreign body under your left thumbnail, your tremors, a small meningioma, and your recent weight loss. We also reviewed your general health maintenance, including vaccinations and blood work.  YOUR PLAN: SUBUNGUAL FOREIGN BODY: You have a foreign object under your left thumbnail that is not currently causing any symptoms. -Monitor the area for any changes or symptoms. If it becomes bothersome, we may consider a minor procedure to remove it.  TREMOR: Your tremors are not well controlled with the current medication. -Continue taking propranolol  as prescribed. We will monitor your condition and adjust the treatment if necessary.  MENINGIOMA: You have a small meningioma that is being monitored. -Continue to follow up with your specialist as recommended. No immediate intervention is needed.  GENERAL HEALTH MAINTENANCE: We discussed your overall health, including vaccinations, diet, exercise, and the need for updated blood work. -You are up to date with your flu shot and colonoscopy. You declined the pneumonia and shingles vaccines, but we can discuss these again at future visits. -Continue with your healthy diet and regular exercise. This is great for your overall health. -We will order blood work to check your cholesterol, A1c, and other important health markers.  Return in about 1 year (around 09/23/2025) for Annual Physical.   Take care, Dr Kennyth  PLEASE NOTE:  If you had any lab tests, please let us  know if you have not heard back within a few days. You may see your results on mychart before we have a chance to review them but we will give you a call once they are reviewed by us .   If we ordered any referrals today, please let us  know if you have not heard from their office within the next week.   If you had any urgent prescriptions sent in today, please check with the pharmacy  within an hour of our visit to make sure the prescription was transmitted appropriately.   Please try these tips to maintain a healthy lifestyle:  Eat at least 3 REAL meals and 1-2 snacks per day.  Aim for no more than 5 hours between eating.  If you eat breakfast, please do so within one hour of getting up.   Each meal should contain half fruits/vegetables, one quarter protein, and one quarter carbs (no bigger than a computer mouse)  Cut down on sweet beverages. This includes juice, soda, and sweet tea.   Drink at least 1 glass of water with each meal and aim for at least 8 glasses per day  Exercise at least 150 minutes every week.    Preventive Care 73 Years and Older, Male Preventive care refers to lifestyle choices and visits with your health care provider that can promote health and wellness. Preventive care visits are also called wellness exams. What can I expect for my preventive care visit? Counseling During your preventive care visit, your health care provider may ask about your: Medical history, including: Past medical problems. Family medical history. History of falls. Current health, including: Emotional well-being. Home life and relationship well-being. Sexual activity. Memory and ability to understand (cognition). Lifestyle, including: Alcohol, nicotine or tobacco, and drug use. Access to firearms. Diet, exercise, and sleep habits. Work and work Astronomer. Sunscreen use. Safety issues such as seatbelt and bike helmet use. Physical exam Your health care provider will check your: Height and weight. These may be used to calculate your BMI (body mass  index). BMI is a measurement that tells if you are at a healthy weight. Waist circumference. This measures the distance around your waistline. This measurement also tells if you are at a healthy weight and may help predict your risk of certain diseases, such as type 2 diabetes and high blood pressure. Heart rate and  blood pressure. Body temperature. Skin for abnormal spots. What immunizations do I need?  Vaccines are usually given at various ages, according to a schedule. Your health care provider will recommend vaccines for you based on your age, medical history, and lifestyle or other factors, such as travel or where you work. What tests do I need? Screening Your health care provider may recommend screening tests for certain conditions. This may include: Lipid and cholesterol levels. Diabetes screening. This is done by checking your blood sugar (glucose) after you have not eaten for a while (fasting). Hepatitis C test. Hepatitis B test. HIV (human immunodeficiency virus) test. STI (sexually transmitted infection) testing, if you are at risk. Lung cancer screening. Colorectal cancer screening. Prostate cancer screening. Abdominal aortic aneurysm (AAA) screening. You may need this if you are a current or former smoker. Talk with your health care provider about your test results, treatment options, and if necessary, the need for more tests. Follow these instructions at home: Eating and drinking  Eat a diet that includes fresh fruits and vegetables, whole grains, lean protein, and low-fat dairy products. Limit your intake of foods with high amounts of sugar, saturated fats, and salt. Take vitamin and mineral supplements as recommended by your health care provider. Do not drink alcohol if your health care provider tells you not to drink. If you drink alcohol: Limit how much you have to 0-2 drinks a day. Know how much alcohol is in your drink. In the [place] one drink equals one 12 oz bottle of beer (355 mL), one 5 oz glass of wine (148 mL), or one 1 oz glass of hard liquor (44 mL). Lifestyle Brush your teeth every morning and night with fluoride toothpaste. Floss one time each day. Exercise for at least 30 minutes 5 or more days each week. Do not use any products that contain nicotine or tobacco.  These products include cigarettes, chewing tobacco, and vaping devices, such as e-cigarettes. If you need help quitting, ask your health care provider. Do not use drugs. If you are sexually active, practice safe sex. Use a condom or other form of protection to prevent STIs. Take aspirin only as told by your health care provider. Make sure that you understand how much to take and what form to take. Work with your health care provider to find out whether it is safe and beneficial for you to take aspirin daily. Ask your health care provider if you need to take a cholesterol-lowering medicine (statin). Find healthy ways to manage stress, such as: Meditation, yoga, or listening to music. Journaling. Talking to a trusted person. Spending time with friends and family. Safety Always wear your seat belt while driving or riding in a vehicle. Do not drive: If you have been drinking alcohol. Do not ride with someone who has been drinking. When you are tired or distracted. While texting. If you have been using any mind-altering substances or drugs. Wear a helmet and other protective equipment during sports activities. If you have firearms in your house, make sure you follow all gun safety procedures. Minimize exposure to UV radiation to reduce your risk of skin cancer. What's next? Visit your health care  provider once a year for an annual wellness visit. Ask your health care provider how often you should have your eyes and teeth checked. Stay up to date on all vaccines. This information is not intended to replace advice given to you by your health care provider. Make sure you discuss any questions you have with your health care provider. Document Revised: 05/22/2021 Document Reviewed: 05/22/2021 Elsevier Patient Education  2024 [employer].

## 2024-09-23 NOTE — Assessment & Plan Note (Signed)
 Follows with neurology.  On propranolol  20 mg twice daily.

## 2024-09-23 NOTE — Assessment & Plan Note (Signed)
 Anticoagulated on Xarelto  20 mg daily due to history of PE.  Check labs today.

## 2024-09-23 NOTE — Assessment & Plan Note (Signed)
 Continue management per GI.  He is on Lialda  2.4 g daily

## 2024-09-26 ENCOUNTER — Ambulatory Visit: Payer: Self-pay | Admitting: Family Medicine

## 2024-09-26 NOTE — Progress Notes (Signed)
 His LDL is a little elevated.  His HDL is also a little low.  Can we verify that he has been taking his Crestor  40 mg daily?   All of his other labs are at goal and we can recheck everything else in a year.

## 2024-09-27 ENCOUNTER — Other Ambulatory Visit (HOSPITAL_COMMUNITY): Payer: Self-pay

## 2024-09-28 ENCOUNTER — Other Ambulatory Visit (HOSPITAL_COMMUNITY): Payer: Self-pay

## 2024-10-03 ENCOUNTER — Other Ambulatory Visit (HOSPITAL_BASED_OUTPATIENT_CLINIC_OR_DEPARTMENT_OTHER): Payer: Self-pay

## 2024-10-04 ENCOUNTER — Other Ambulatory Visit (HOSPITAL_BASED_OUTPATIENT_CLINIC_OR_DEPARTMENT_OTHER): Payer: Self-pay

## 2024-10-04 DIAGNOSIS — F331 Major depressive disorder, recurrent, moderate: Secondary | ICD-10-CM | POA: Diagnosis not present

## 2024-10-04 DIAGNOSIS — F1020 Alcohol dependence, uncomplicated: Secondary | ICD-10-CM | POA: Diagnosis not present

## 2024-10-04 MED ORDER — SERTRALINE HCL 25 MG PO TABS
25.0000 mg | ORAL_TABLET | Freq: Every day | ORAL | 0 refills | Status: AC
  Filled 2024-10-04 – 2024-11-25 (×2): qty 90, 90d supply, fill #0

## 2024-10-06 ENCOUNTER — Other Ambulatory Visit: Payer: Self-pay

## 2024-10-18 ENCOUNTER — Other Ambulatory Visit: Payer: Self-pay

## 2024-10-20 DIAGNOSIS — F331 Major depressive disorder, recurrent, moderate: Secondary | ICD-10-CM | POA: Diagnosis not present

## 2024-10-20 DIAGNOSIS — F1020 Alcohol dependence, uncomplicated: Secondary | ICD-10-CM | POA: Diagnosis not present

## 2024-11-01 DIAGNOSIS — F1020 Alcohol dependence, uncomplicated: Secondary | ICD-10-CM | POA: Diagnosis not present

## 2024-11-01 DIAGNOSIS — F331 Major depressive disorder, recurrent, moderate: Secondary | ICD-10-CM | POA: Diagnosis not present

## 2024-11-04 ENCOUNTER — Other Ambulatory Visit (HOSPITAL_COMMUNITY): Payer: Self-pay

## 2024-11-05 NOTE — Progress Notes (Unsigned)
 08/04/24- 73 yoM for sleep evaluation courtesy of Dr Asberry Tat with concern of suspected OSA Medical problem list includes Acoustic Neuroma, Meningioma, Hyperparathyroid, Memory Loss. Hx PE/ Chronic Xarelto , Epworth score- Body weight today-188 lbs Discussed the use of AI scribe software for clinical note transcription with the patient, who gave verbal consent to proceed.  History of Present Illness   Shaun Armstrong is a 73 year old male who presents with potential sleep apnea. He was referred by Dr. Tiburcio, his neurologist, for concerns about his breathing during sleep.  He does not perceive any issues with his sleep but acknowledges the possibility of being unaware of problems during sleep. He has never undergone a sleep study and has not had any surgeries on his nose or throat, except for a tonsillectomy in childhood. He sometimes experiences daytime fatigue. He does consider that he sleeps too much. Coffee- 2-3 cups. No sleep meds.   His sister reports that he snores, particularly when sleeping on his back. There is a family history of snoring. He quit smoking many years ago. ENT surgery for tonsils and thyroid .     Prior to Admission medications   Medication Sig Start Date End Date Taking? Authorizing Provider  Cholecalciferol (VITAMIN D ) 125 MCG (5000 UT) CAPS Take 1 capsule by mouth once a week.   Yes [provider]  glucosamine-chondroitin 500-400 MG tablet Take 1 tablet by mouth daily. With msm   Yes [provider]  mesalamine  (LIALDA ) 1.2 g EC tablet Take 2 tablets (2.4 g total) by mouth daily. 10/08/23  Yes   Multiple Vitamin (MULTIVITAMIN) tablet Take 1 tablet by mouth daily.   Yes [provider]  Omega-3 Fatty Acids (FISH OIL) 1000 MG CAPS Take 2,000 mg by mouth daily.   Yes [provider]  Polyethyl Glycol-Propyl Glycol (SYSTANE FREE OP) Place 2 drops into both eyes 5 (five) times daily as needed (dry eyes).   Yes [provider]   propranolol  (INDERAL ) 20 MG tablet Take 2 tablets (40 mg total) by mouth daily. 05/26/24  Yes Kennyth Worth CHRISTELLA, MD  rivaroxaban  (XARELTO ) 20 MG TABS tablet Take 1 tablet (20 mg total) by mouth daily with supper. 05/23/24  Yes Kennyth Worth CHRISTELLA, MD  rosuvastatin  (CRESTOR ) 40 MG tablet Take 1 tablet (40 mg total) by mouth daily. 09/29/23  Yes Kennyth Worth CHRISTELLA, MD  sertraline  (ZOLOFT ) 25 MG tablet Take 1 tablet (25 mg total) by mouth daily. 08/03/24  Yes   Turmeric 500 MG TABS Take by mouth.   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   Decreased peripheral vision 11/04/2011   Elevated alcohol use    Essential tremor    Gastroesophageal reflux disease without esophagitis 10/25/2018   Hyperlipidemia 10/05/2012   Hyperparathyroidism, primary 06/07/2020   Inflammatory polyps of colon without complications    Inguinal hernia 07/2016   Memory dysfunction 06/07/2024   Meningioma 06/07/2024   14 mm; right frontal convexity    Meralgia paresthetica of right side 10/25/2018   Regional enteritis of small intestine with large intestine    Retinal detachment  S/P parathyroidectomy 05/07/2022   Sun-damaged skin 08/03/2017   Wears hearing aid, left ear    Past Surgical History:  Procedure Laterality Date   ACOUSTIC NEUROMA RESECTION Right    BROW LIFT AND BLEPHAROPLASTY Bilateral 10/11/2012   CATARACT EXTRACTION W/ INTRAOCULAR LENS  IMPLANT, BILATERAL Bilateral    HERNIA REPAIR     INGUINAL HERNIA REPAIR Right 07/16/2016   Procedure: LAPAROSCOPIC RIGHT INGUINAL HERNIA;  Surgeon: Vicenta Poli, MD;  Location:  [facility];  Service: General;  Laterality: Right;   INSERTION OF MESH Right 07/16/2016   Procedure: INSERTION OF MESH;  Surgeon: Vicenta Poli, MD;  Location: [facility];  Service: General;  Laterality: Right;   MELANOMA EXCISION  01/2022   scalp   MOHS SURGERY N/A 11/05/2021   scalp   PARATHYROIDECTOMY Left 05/07/2022   Procedure: MINIMALLY INVASIVE LEFT INFERIOR PARATHYROIDECTOMY;  Surgeon: Rubin Calamity, MD;  Location: [facility] OR;  Service: General;  Laterality: Left;   RETINAL DETACHMENT SURGERY Left    TONSILLECTOMY     Family History  Problem Relation Age of Onset   Diabetes Mother    Dementia Mother    Tremor Sister    Diabetes Sister    Tremor Half-Sister    Tremor Half-Sister    Hyperparathyroidism Neg Hx    Social History   Socioeconomic History   Marital status: Married    Spouse name: Shaun Armstrong   Number of children: 1   Years of education: 13   Highest education level: Some college, no degree  Occupational History   Occupation: Retired    Comment: psychologist, sport and exercise; soil scientist  Tobacco Use   Smoking status: Former    Current packs/day: 0.00    Average packs/day: 1 pack/day for 4.0 years (4.0 ttl pk-yrs)    Types: Cigarettes    Start date: 12/09/1975    Quit date: 12/09/1979    Years since quitting: 44.6   Smokeless tobacco: Never   Tobacco comments:    Quit 38 years ago  Vaping Use   Vaping status: Some Days  Substance and Sexual Activity   Alcohol use: Yes    Alcohol/week: 14.0 standard drinks of alcohol    Types: 14 Cans of beer per week   Drug use: No   Sexual activity: Yes  Other Topics Concern   Not on file  Social History Narrative   Right handed    Social Drivers of Health   Financial Resource Strain: Low Risk  (02/11/2024)   Overall Financial Resource Strain (CARDIA)    Difficulty of Paying Living Expenses: Not hard at all  Food Insecurity: No Food Insecurity (02/11/2024)   Hunger Vital Sign    Worried About Running  Out of Food in the Last Year: Never true    Ran Out of Food in the Last Year: Never true  Transportation Needs: No Transportation Needs (02/11/2024)   PRAPARE - Administrator, Civil Service (Medical): No    Lack of Transportation (Non-Medical): No  Physical Activity: Insufficiently Active (02/11/2024)   Exercise Vital Sign    Days of Exercise per Week: 6 days    Minutes of Exercise per Session: 20 min  Stress: No Stress Concern Present (02/11/2024)   [employer] of Occupational Health - Occupational Stress Questionnaire    Feeling of Stress : Only a little  Social Connections: Socially Integrated (02/11/2024)   Social Connection and Isolation Panel    Frequency of Communication with Friends and Family: More than three  times a week    Frequency of Social Gatherings with Friends and Family: More than three times a week    Attends Religious Services: 1 to 4 times per year    Active Member of [employer] or Organizations: Yes    Attends Banker Meetings: 1 to 4 times per year    Marital Status: Married  Catering Manager Violence: Not At Risk (02/11/2024)   Humiliation, Afraid, Rape, and Kick questionnaire    Fear of Current or Ex-Partner: No    Emotionally Abused: No    Physically Abused: No    Sexually Abused: No   Assessment and Plan:    Suspected obstructive sleep apnea with associated daytime sleepiness and snoring Suspected obstructive sleep apnea due to snoring, daytime sleepiness, and family history. Explained potential health impacts and treatment options, including CPAP, dental devices, and surgery. - Order home sleep test. - Instruct him to contact office for results. - Discuss treatment options if confirmed.    11/08/24- 73 yoM for sleep evaluation courtesy of Dr Asberry Tat with concern of suspected OSA Medical problem list includes Acoustic Neuroma, Meningioma, Hyperparathyroid, Memory Loss. Hx PE/ Chronic Xarelto , HST(SNAP) 08/04/24- AHI 22.7%, desat to  72%, body weight 190 lbs Body weight today-185 lbs For treatment decision- Options reviewed. He chooses to try CPAP  ROS-see HPI   + = positive Constitutional:    weight loss, night sweats, fevers, chills, fatigue, lassitude. HEENT:    headaches, difficulty swallowing, tooth/dental problems, sore throat,       sneezing, itching, ear ache, nasal congestion, post nasal drip, snoring CV:    chest pain, orthopnea, PND, swelling in lower extremities, anasarca,                                  dizziness, palpitations Resp:   shortness of breath with exertion or at rest.                productive cough,   non-productive cough, coughing up of blood.              change in color of mucus.  wheezing.   Skin:    rash or lesions. GI:  No-   heartburn, indigestion, abdominal pain, nausea, vomiting, diarrhea,                 change in bowel habits, loss of appetite GU: dysuria, change in color of urine, no urgency or frequency.   flank pain. MS:   joint pain, stiffness, decreased range of motion, back pain. Neuro-     nothing unusual Psych:  change in mood or affect.  depression or anxiety.   memory loss.  OBJ- Physical Exam   Not Obese General- Alert, Oriented, Affect-appropriate, Distress- none acute Skin- rash-none, lesions- none, excoriation- none Lymphadenopathy- none Head- atraumatic            Eyes- Gross vision intact, PERRLA, conjunctivae and secretions clear            Ears- Hearing, canals-normal            Nose- Clear, no-Septal dev, mucus, polyps, erosion, perforation             Throat- Mallampati III , mucosa clear , drainage- none, tonsils- atrophic Neck- flexible , trachea midline, no stridor , thyroid  nl, carotid no bruit Chest - symmetrical excursion , unlabored  Heart/CV- RRR , no murmur , no gallop  , no rub, nl s1 s2                           - JVD- none , edema- none, stasis changes- none, varices- none           Lung- clear to P&A, wheeze- none, cough- none ,  dullness-none, rub- none           Chest wall-  Abd-  Br/ Gen/ Rectal- Not done, not indicated Extrem- cyanosis- none, clubbing, none, atrophy- none, strength- nl Neuro- grossly intact to observation

## 2024-11-08 ENCOUNTER — Ambulatory Visit: Admitting: Internal Medicine

## 2024-11-08 ENCOUNTER — Other Ambulatory Visit (HOSPITAL_COMMUNITY): Payer: Self-pay

## 2024-11-08 ENCOUNTER — Encounter: Payer: Self-pay | Admitting: Internal Medicine

## 2024-11-08 VITALS — BP 134/86 | HR 70 | Temp 97.8°F | Ht 69.5 in | Wt 185.0 lb

## 2024-11-08 DIAGNOSIS — Z87891 Personal history of nicotine dependence: Secondary | ICD-10-CM | POA: Diagnosis not present

## 2024-11-08 DIAGNOSIS — K5080 Crohn's disease of both small and large intestine without complications: Secondary | ICD-10-CM

## 2024-11-08 DIAGNOSIS — G4733 Obstructive sleep apnea (adult) (pediatric): Secondary | ICD-10-CM

## 2024-11-08 NOTE — Patient Instructions (Signed)
 Order- new DME, new CPAP auto 5-20, mask of choice, humidifier, supplies, AirView/ card  At check out, ask to be brought back with a sleep doctor

## 2024-11-11 ENCOUNTER — Other Ambulatory Visit (HOSPITAL_COMMUNITY): Payer: Self-pay

## 2024-11-15 ENCOUNTER — Encounter: Payer: Self-pay | Admitting: Internal Medicine

## 2024-11-15 DIAGNOSIS — G4733 Obstructive sleep apnea (adult) (pediatric): Secondary | ICD-10-CM | POA: Insufficient documentation

## 2024-11-15 NOTE — Assessment & Plan Note (Signed)
 Managed by GI

## 2024-11-15 NOTE — Assessment & Plan Note (Signed)
 Treatment options considered. Plan- CPAP auto 5-20

## 2024-11-23 ENCOUNTER — Other Ambulatory Visit (HOSPITAL_BASED_OUTPATIENT_CLINIC_OR_DEPARTMENT_OTHER): Payer: Self-pay

## 2024-11-25 ENCOUNTER — Other Ambulatory Visit (HOSPITAL_COMMUNITY): Payer: Self-pay

## 2024-11-25 ENCOUNTER — Telehealth: Payer: Self-pay | Admitting: Pharmacist

## 2024-11-25 ENCOUNTER — Other Ambulatory Visit (HOSPITAL_BASED_OUTPATIENT_CLINIC_OR_DEPARTMENT_OTHER): Payer: Self-pay

## 2024-11-25 MED ORDER — MESALAMINE 1.2 G PO TBEC
2.4000 g | DELAYED_RELEASE_TABLET | Freq: Every day | ORAL | 5 refills | Status: AC
  Filled 2024-11-25 – 2024-11-29 (×3): qty 120, 60d supply, fill #0

## 2024-11-25 MED ORDER — ROSUVASTATIN CALCIUM 40 MG PO TABS
40.0000 mg | ORAL_TABLET | Freq: Every day | ORAL | 3 refills | Status: AC
  Filled 2024-11-25: qty 69, 69d supply, fill #0
  Filled 2024-11-25: qty 21, 21d supply, fill #0

## 2024-11-25 NOTE — Telephone Encounter (Signed)
 Pharmacy Quality Measure Review  This patient is appearing on a report for being at risk of failing the adherence measure for cholesterol (statin) medications this calendar year.   Medication: rosuvastatin   Last fill date: 08/30/2024 for 90 day supply  Our outpatient pharmacy had reached out to patient thru MyChart about filling his other medications but no response.  I spoke with Mrs. Delores. She states he is likely getting close to needing refills for him maintenance meds - sertraline , rosuvastatin , Xarleto, propranolol  and mesalamine .  He will need updated Rx for rosuvastatin  - will send to Dr Kennyth and also for mesalamine  - Will ask pharmacy to request from Dr Burnette.

## 2024-11-29 ENCOUNTER — Other Ambulatory Visit (HOSPITAL_COMMUNITY): Payer: Self-pay

## 2024-11-29 ENCOUNTER — Other Ambulatory Visit: Payer: Self-pay

## 2024-11-30 ENCOUNTER — Other Ambulatory Visit (HOSPITAL_COMMUNITY): Payer: Self-pay

## 2024-12-05 ENCOUNTER — Other Ambulatory Visit (HOSPITAL_BASED_OUTPATIENT_CLINIC_OR_DEPARTMENT_OTHER): Payer: Self-pay

## 2024-12-05 ENCOUNTER — Other Ambulatory Visit: Payer: Self-pay

## 2024-12-06 ENCOUNTER — Other Ambulatory Visit (HOSPITAL_BASED_OUTPATIENT_CLINIC_OR_DEPARTMENT_OTHER): Payer: Self-pay

## 2024-12-06 ENCOUNTER — Other Ambulatory Visit: Payer: Self-pay

## 2024-12-06 ENCOUNTER — Ambulatory Visit: Payer: Self-pay

## 2024-12-06 MED ORDER — SERTRALINE HCL 50 MG PO TABS
50.0000 mg | ORAL_TABLET | Freq: Every day | ORAL | 0 refills | Status: AC
  Filled 2024-12-06: qty 60, 60d supply, fill #0

## 2024-12-06 NOTE — Telephone Encounter (Signed)
 FYI Only or Action Required?: Action required by provider: update on patient condition.  Patient was last seen in primary care on 09/23/2024 by Shaun Worth HERO, Shaun Armstrong.  Called Nurse Triage reporting Hypotension.  Symptoms began a week ago.  Triage Disposition: Go to ED Now (or PCP Triage)  Patient/caregiver understands and will follow disposition?: No, wishes to speak with PCP       Copied from CRM #[id number]. Topic: Clinical - Red Word Triage >> Dec 06, 2024  4:55 PM Shaun Armstrong wrote: Red Word that prompted transfer to Nurse Triage: Dizziness, patient states he has had low blood pressure the last week. Last reading> 141/73 pulse 44 Reason for Disposition  [1] Fall in systolic BP > 20 mm Hg from normal AND [2] feeling weak or lightheaded  Answer Assessment - Initial Assessment Questions This RN recommends pt goes to ED. Pt refused. This RN educated pt on why he should go. CAL now closed. This RN will send a high priority message to clinic. Pt call back number is [phone].    Onset: 12/19 Dizziness intermittent Low BP per pt for last week Last BP  today: 141/73, HR 44 (HR- this was the lowest, several in 40s, highest 69), pt not sure of his normal BP Pt checked BP while on phone 155/92, HR 49; not feeling dizzy currently  Denies falling, chest pain, heart palpitations, abdominal pain, fever, major surgery in past month, signs of dehydration  Protocols used: Blood Pressure - Low-A-AH

## 2024-12-07 NOTE — Telephone Encounter (Signed)
 Please call patient and schedule an appt to see a provider today. Due to blood pressure issue and dizziness.

## 2024-12-07 NOTE — Telephone Encounter (Signed)
 Noted

## 2024-12-07 NOTE — Telephone Encounter (Signed)
 Pt unable to schedule an appt due to having another appointment elsewhere. Advised pt to go to UC if possible & patient has agreed to go to [facility] on [address].

## 2024-12-21 ENCOUNTER — Other Ambulatory Visit (HOSPITAL_BASED_OUTPATIENT_CLINIC_OR_DEPARTMENT_OTHER): Payer: Self-pay

## 2025-01-04 ENCOUNTER — Other Ambulatory Visit (HOSPITAL_COMMUNITY): Payer: Self-pay

## 2025-01-13 ENCOUNTER — Other Ambulatory Visit: Payer: Self-pay

## 2025-02-14 ENCOUNTER — Ambulatory Visit

## 2025-02-15 ENCOUNTER — Encounter: Admitting: Pulmonary Disease

## 2025-09-28 ENCOUNTER — Encounter: Admitting: Family Medicine
